# Patient Record
Sex: Male | Born: 1965 | Race: White | Hispanic: No | Marital: Married | Smoking: Former smoker
Health system: Southern US, Community
[De-identification: ages and names within clinical notes are randomized; demographics above are authoritative.]

## PROBLEM LIST (undated history)

## (undated) DIAGNOSIS — K5790 Diverticulosis of intestine, part unspecified, without perforation or abscess without bleeding: Secondary | ICD-10-CM

## (undated) DIAGNOSIS — T7840XA Allergy, unspecified, initial encounter: Secondary | ICD-10-CM

## (undated) DIAGNOSIS — F909 Attention-deficit hyperactivity disorder, unspecified type: Secondary | ICD-10-CM

## (undated) DIAGNOSIS — M1990 Unspecified osteoarthritis, unspecified site: Secondary | ICD-10-CM

## (undated) HISTORY — DX: Allergy, unspecified, initial encounter: T78.40XA

## (undated) HISTORY — DX: Attention-deficit hyperactivity disorder, unspecified type: F90.9

## (undated) HISTORY — DX: Diverticulosis of intestine, part unspecified, without perforation or abscess without bleeding: K57.90

## (undated) HISTORY — DX: Unspecified osteoarthritis, unspecified site: M19.90

## (undated) HISTORY — PX: WISDOM TOOTH EXTRACTION: SHX21

---

## 1999-01-17 ENCOUNTER — Encounter: Admission: RE | Admit: 1999-01-17 | Discharge: 1999-04-17 | Payer: Self-pay

## 2005-09-18 ENCOUNTER — Ambulatory Visit: Payer: Self-pay | Admitting: Internal Medicine

## 2005-10-31 ENCOUNTER — Ambulatory Visit: Payer: Self-pay | Admitting: Internal Medicine

## 2006-12-04 DIAGNOSIS — J309 Allergic rhinitis, unspecified: Secondary | ICD-10-CM | POA: Insufficient documentation

## 2006-12-06 ENCOUNTER — Ambulatory Visit: Payer: Self-pay | Admitting: Internal Medicine

## 2006-12-06 LAB — CONVERTED CEMR LAB
AST: 21 units/L (ref 0–37)
Alkaline Phosphatase: 69 units/L (ref 39–117)
BUN: 13 mg/dL (ref 6–23)
Basophils Relative: 0.4 % (ref 0.0–1.0)
CO2: 29 meq/L (ref 19–32)
Chloride: 106 meq/L (ref 96–112)
Creatinine, Ser: 0.7 mg/dL (ref 0.4–1.5)
HCT: 44.8 % (ref 39.0–52.0)
Hemoglobin: 15.2 g/dL (ref 13.0–17.0)
Monocytes Absolute: 0.6 10*3/uL (ref 0.2–0.7)
Monocytes Relative: 8.4 % (ref 3.0–11.0)
Neutrophils Relative %: 59.5 % (ref 43.0–77.0)
Potassium: 3.5 meq/L (ref 3.5–5.1)
RBC: 5.39 M/uL (ref 4.22–5.81)
RDW: 12.7 % (ref 11.5–14.6)
TSH: 0.69 microintl units/mL (ref 0.35–5.50)
Total Bilirubin: 1 mg/dL (ref 0.3–1.2)
Total CHOL/HDL Ratio: 5.5
Total Protein: 6.7 g/dL (ref 6.0–8.3)
Triglycerides: 190 mg/dL — ABNORMAL HIGH (ref 0–149)
VLDL: 38 mg/dL (ref 0–40)

## 2006-12-31 ENCOUNTER — Encounter: Payer: Self-pay | Admitting: Internal Medicine

## 2006-12-31 ENCOUNTER — Encounter: Admission: RE | Admit: 2006-12-31 | Discharge: 2007-02-20 | Payer: Self-pay | Admitting: Internal Medicine

## 2007-01-14 ENCOUNTER — Encounter: Payer: Self-pay | Admitting: Internal Medicine

## 2007-10-09 ENCOUNTER — Encounter: Payer: Self-pay | Admitting: Internal Medicine

## 2007-10-09 ENCOUNTER — Telehealth (INDEPENDENT_AMBULATORY_CARE_PROVIDER_SITE_OTHER): Payer: Self-pay | Admitting: *Deleted

## 2007-12-08 ENCOUNTER — Encounter (INDEPENDENT_AMBULATORY_CARE_PROVIDER_SITE_OTHER): Payer: Self-pay | Admitting: *Deleted

## 2008-01-23 ENCOUNTER — Ambulatory Visit: Payer: Self-pay | Admitting: Internal Medicine

## 2008-11-19 ENCOUNTER — Telehealth (INDEPENDENT_AMBULATORY_CARE_PROVIDER_SITE_OTHER): Payer: Self-pay | Admitting: *Deleted

## 2008-11-22 ENCOUNTER — Encounter: Payer: Self-pay | Admitting: Internal Medicine

## 2009-03-29 ENCOUNTER — Telehealth: Payer: Self-pay | Admitting: Internal Medicine

## 2009-03-30 ENCOUNTER — Telehealth (INDEPENDENT_AMBULATORY_CARE_PROVIDER_SITE_OTHER): Payer: Self-pay | Admitting: *Deleted

## 2010-01-11 ENCOUNTER — Ambulatory Visit: Payer: Self-pay | Admitting: Internal Medicine

## 2010-05-02 ENCOUNTER — Ambulatory Visit: Payer: Self-pay | Admitting: Internal Medicine

## 2010-05-02 DIAGNOSIS — M25529 Pain in unspecified elbow: Secondary | ICD-10-CM | POA: Insufficient documentation

## 2010-05-02 DIAGNOSIS — H9190 Unspecified hearing loss, unspecified ear: Secondary | ICD-10-CM | POA: Insufficient documentation

## 2010-05-02 LAB — CONVERTED CEMR LAB
Blood in Urine, dipstick: NEGATIVE
Ketones, urine, test strip: NEGATIVE
Nitrite: NEGATIVE
Protein, U semiquant: NEGATIVE
Specific Gravity, Urine: 1.02
Urobilinogen, UA: 0.2
WBC Urine, dipstick: NEGATIVE

## 2010-05-05 LAB — CONVERTED CEMR LAB
ALT: 31 units/L (ref 0–53)
AST: 26 units/L (ref 0–37)
Albumin: 4.5 g/dL (ref 3.5–5.2)
Alkaline Phosphatase: 65 units/L (ref 39–117)
Basophils Absolute: 0 10*3/uL (ref 0.0–0.1)
Calcium: 8.9 mg/dL (ref 8.4–10.5)
Eosinophils Relative: 1.6 % (ref 0.0–5.0)
GFR calc non Af Amer: 102.07 mL/min (ref >60)
HCT: 44.2 % (ref 39.0–52.0)
HDL: 47 mg/dL (ref >39.00)
Hemoglobin: 14.9 g/dL (ref 13.0–17.0)
Lymphocytes Relative: 21.5 % (ref 12.0–46.0)
Lymphs Abs: 1.5 10*3/uL (ref 0.7–4.0)
Monocytes Relative: 8.4 % (ref 3.0–12.0)
Neutro Abs: 4.8 10*3/uL (ref 1.4–7.7)
Potassium: 4.3 meq/L (ref 3.5–5.1)
RBC: 5.05 M/uL (ref 4.22–5.81)
RDW: 13.8 % (ref 11.5–14.6)
Sodium: 140 meq/L (ref 135–145)
TSH: 0.69 microintl units/mL (ref 0.35–5.50)
Total Bilirubin: 0.7 mg/dL (ref 0.3–1.2)
Total CHOL/HDL Ratio: 5
Triglycerides: 165 mg/dL — ABNORMAL HIGH (ref 0.0–149.0)
VLDL: 33 mg/dL (ref 0.0–40.0)
WBC: 7.1 10*3/uL (ref 4.5–10.5)

## 2010-09-05 NOTE — Assessment & Plan Note (Signed)
Summary: to be est/njr pt rsc/njr   Vital Signs:  Patient profile:   45 year old male Height:      71.5 inches Weight:      280 pounds BMI:     38.65 Temp:     98.7 degrees F oral Pulse rate:   78 / minute Pulse rhythm:   regular Resp:     12 per minute BP sitting:   120 / 80  Vitals Entered By: Lynann Beaver CMA (May 02, 2010 10:17 AM) CC: New ,,,,,,,,,,Cpx  concerned about numbness in different areas of body, decreased hearing, difficulty sleeping,  Is Patient Diabetic? No Pain Assessment Patient in pain? no        CC:  New , , , , , , , , , , Cpx  concerned about numbness in different areas of body, decreased hearing, difficulty sleeping, and .  History of Present Illness: CPX  concerned with weight  left elbow pain---duration 2-3 months, increases pain with bending  worried about right ankle being numb and twitching at times  hearing---concerned with hearing loss  Hands go numb--sxs are intermittent, tend to be most noticeable at rest  wife states he is irritable at time  Current Problems (verified): 1)  Allergic Rhinitis  (ICD-477.9) 2)  Overweight  (ICD-278.02)  Current Medications (verified): 1)  Allegra 180 Mg  Tabs (Fexofenadine Hcl) .... Take 1 Tablet By Mouth Once A Day 2)  Flonase 50 Mcg/act  Susp (Fluticasone Propionate) .Marland Kitchen.. 1-2 Sprays Each Nostril As Needed 3)  Epipen 2-Pak 0.3 Mg/0.61ml Devi (Epinephrine) .... As Directed 4)  Aspirin 81 Mg Tabs (Aspirin) .... Take 1 Tab Once Daily 5)  Multivitamins  Tabs (Multiple Vitamin) .... Take 1 Tab Once Daily 6)  Aleve 220 Mg Tabs (Naproxen Sodium) .... As Needed  Allergies (verified): No Known Drug Allergies  Family History: father - COPD, hx of rectal CA, died COPD age 23 mother- healthy, hx of Crohns (no longer treated)   Impression & Recommendations:  Problem # 1:  PREVENTIVE HEALTH CARE (ICD-V70.0)  health maint utd needs to lose weight discussed need for exercise and  diet  Orders: Venipuncture (51884) UA Dipstick w/o Micro (automated)  (81003) Specimen Handling (16606) TLB-Lipid Panel (80061-LIPID) TLB-BMP (Basic Metabolic Panel-BMET) (80048-METABOL) TLB-CBC Platelet - w/Differential (85025-CBCD) TLB-Hepatic/Liver Function Pnl (80076-HEPATIC) TLB-TSH (Thyroid Stimulating Hormone) (84443-TSH) Specimen Handling (30160)  Problem # 2:  OVERWEIGHT (ICD-278.02) this is clearly his most significant medical issue  Problem # 3:  ALLERGIC RHINITIS (ICD-477.9)  His updated medication list for this problem includes:    Allegra 180 Mg Tabs (Fexofenadine hcl) .Marland Kitchen... Take 1 tablet by mouth once a day    Flonase 50 Mcg/act Susp (Fluticasone propionate) .Marland Kitchen... 1-2 sprays each nostril as needed  Problem # 4:  ELBOW PAIN, LEFT (ICD-719.42) probably epicondylitis ice, elbow brace  Problem # 5:  HEARING LOSS (ICD-389.9)  Orders: Audiology (Audio)  Complete Medication List: 1)  Allegra 180 Mg Tabs (Fexofenadine hcl) .... Take 1 tablet by mouth once a day 2)  Flonase 50 Mcg/act Susp (Fluticasone propionate) .Marland Kitchen.. 1-2 sprays each nostril as needed 3)  Epipen 2-pak 0.3 Mg/0.29ml Devi (Epinephrine) .... As directed 4)  Aspirin 81 Mg Tabs (Aspirin) .... Take 1 tab once daily 5)  Multivitamins Tabs (Multiple vitamin) .... Take 1 tab once daily 6)  Aleve 220 Mg Tabs (Naproxen sodium) .... As needed  Other Orders: Admin 1st Vaccine (10932) Flu Vaccine 41yrs + (35573)  Flu Vaccine Consent Questions  Do you have a history of severe allergic reactions to this vaccine? no    Any prior history of allergic reactions to egg and/or gelatin? no    Do you have a sensitivity to the preservative Thimersol? no    Do you have a past history of Guillan-Barre Syndrome? no    Do you currently have an acute febrile illness? no    Have you ever had a severe reaction to latex? no    Vaccine information given and explained to patient? yes    Are you currently pregnant? no    Lot  Number:[id number]   Exp Date:02/03/2011   Site Given  Left Deltoid IM  .lbflu  Physical Exam General Appearance: well developed, well nourished, no acute distress Eyes: conjunctiva and lids normal, PERRL, EOMI, Ears, Nose, Mouth, Throat: TM clear, nares clear, oral exam WNL Neck: supple, no lymphadenopathy, no thyromegaly, no JVD Respiratory: clear to auscultation and percussion, respiratory effort normal Cardiovascular: regular rate and rhythm, S1-S2, no murmur, rub or gallop, no bruits, peripheral pulses normal and symmetric, no cyanosis, clubbing, edema or varicosities Chest: no scars, masses, tenderness; no asymmetry, skin changes, nipple discharge, no gynecomastia   Gastrointestinal: soft, non-tender; no hepatosplenomegaly, masses; active bowel sounds all quadrants, ; no masses, tenderness, hemorrhoids  Genitourinary: no hernia, or prostate enlargement Lymphatic: no cervical, axillary or inguinal adenopathy Musculoskeletal: gait normal, muscle tone and strength WNL, no joint swelling, effusions, discoloration, crepitus  Skin: clear, good turgor, color WNL, no rashes, lesions, or ulcerations Neurologic: normal mental status, normal reflexes, normal strength, sensation, and motion Psychiatric: alert; oriented to person, place and time Other Exam:   Laboratory Results   Urine Tests  Date/Time Recieved: May 02, 2010 12:25 PM\par  Date/Time Reported: May 02, 2010 12:24 PM   Routine Urinalysis   Color: yellow Appearance: Clear Glucose: negative   (Normal Range: Negative) Bilirubin: negative   (Normal Range: Negative) Ketone: negative   (Normal Range: Negative) Spec. Gravity: 1.020   (Normal Range: 1.003-1.035) Blood: negative   (Normal Range: Negative) pH: 6.0   (Normal Range: 5.0-8.0) Protein: negative   (Normal Range: Negative) Urobilinogen: 0.2   (Normal Range: 0-1) Nitrite: negative   (Normal Range: Negative) Leukocyte Esterace: negative   (Normal Range:  Negative)    Comments: Wynona Canes, CMA  May 02, 2010 12:25 PM

## 2010-09-05 NOTE — Assessment & Plan Note (Signed)
Summary: ringworm//lch   Vital Signs:  Patient profile:   45 year old male Height:      72.5 inches Weight:      273 pounds Temp:     98.4 degrees F oral Pulse rate:   82 / minute BP sitting:   140 / 80  (left arm)  Vitals Entered By: Jeremy Johann CMA (January 11, 2010 11:23 AM) CC: red rash on groin area and back of both thighs Comments REVIEWED MED LIST, PATIENT AGREED DOSE AND INSTRUCTION CORRECT    History of Present Illness: rash  in the lower extremities for a few days, no itching He used   spandex shorts around the time of the onset of the symptoms  Allergies (verified): No Known Drug Allergies  Past History:  Past Medical History: Reviewed history from 01/23/2008 and no changes required. Allergic rhinitis  Review of Systems       no fever but in the last few days has been feeling tired and sweaty Not used any new medications and   no new detergents No tick bites that he knows No cough  Physical Exam  General:  alert and well-developed.  no apparent distress Skin:  multiple erythematous lesions in the lower extremities, only in the proximal aspect back and front of the legs. The lesions are slightly elevated, not scaly, annular and arciform, size varies but no more than 1.5 cm.   Impression & Recommendations:  Problem # 1:  RASH-NONVESICULAR (ICD-782.1) rash is limited to the area that was in contact with spandex shorts that he used last week. Based on that and the fact that is not itching, first in my mind is an  allergic/topical reaction. Plan: Treat with steroids If no better consider empirical treatment for a fungal infection versus dermatology referral His updated medication list for this problem includes:    Triamcinolone Acetonide 0.1 % Lotn (Triamcinolone acetonide) .Marland Kitchen... Apply two times a day x 7 days  Complete Medication List: 1)  Allegra 180 Mg Tabs (Fexofenadine hcl) .Marland Kitchen.. 1 by mouth qd 2)  Flonase 50 Mcg/act Susp (Fluticasone propionate)  .Marland Kitchen.. 1-2 sprays each nostril 3)  Epipen 2-pak 0.3 Mg/0.57ml Devi (Epinephrine) .... As directed 4)  Aspirin 81 Mg Tabs (Aspirin) .... Take 1 tab once daily 5)  Multivitamins Tabs (Multiple vitamin) .... Take 1 tab once daily 6)  Aleve 220 Mg Tabs (Naproxen sodium) .... As needed 7)  Triamcinolone Acetonide 0.1 % Lotn (Triamcinolone acetonide) .... Apply two times a day x 7 days Prescriptions: TRIAMCINOLONE ACETONIDE 0.1 % LOTN (TRIAMCINOLONE ACETONIDE) apply two times a day x 7 days  #120cc x 1   Entered and Authorized by:   Nathan Quick E. Leopold Smyers MD   Signed by:   Nathan Rod. Emitt Maglione MD on 01/11/2010   Method used:   Print then Give to Patient   RxID:   [id number]

## 2011-02-02 ENCOUNTER — Ambulatory Visit (INDEPENDENT_AMBULATORY_CARE_PROVIDER_SITE_OTHER): Payer: BC Managed Care – PPO | Admitting: Internal Medicine

## 2011-02-02 ENCOUNTER — Encounter: Payer: Self-pay | Admitting: Internal Medicine

## 2011-02-02 VITALS — BP 104/76 | Temp 98.2°F | Wt 248.0 lb

## 2011-02-02 DIAGNOSIS — J309 Allergic rhinitis, unspecified: Secondary | ICD-10-CM

## 2011-02-02 MED ORDER — PREDNISONE (PAK) 10 MG PO TABS: ORAL_TABLET | ORAL | Status: DC

## 2011-02-02 NOTE — Progress Notes (Signed)
  Subjective:    Patient ID: Nathan Camacho, male    DOB: [birth year], 45 y.o.   MRN: [id number]  HPI  Patient is a 42 sinus congestion. He denies any fevers or chills. He has a history of allergic rhinitis. Previously was taking Allegra and Flonase.  Patient has been losing weight by exercising occasionally been following a better diet. Past Medical History  Diagnosis Date  . Allergy    No past surgical history on file.  reports that he has never smoked. He does not have any smokeless tobacco history on file. He reports that he drinks alcohol. He reports that he does not use illicit drugs. family history includes COPD in his father and Crohn's disease in his mother. No Known Allergies   Review of Systems No other complaints.    Objective:   Physical Exam Well-developed male in no acute distress. HEENT exam atraumatic, normocephalic, neck supple oropharynx is moist. There is no posterior oropharynx erythema or exudate. Nasal mucosa is red and swollen. Clear to auscultation.       Assessment & Plan:  I suspect the patient has an exacerbation of allergic rhinitis. Will treat with short course oral steroid. Side effects discussed.  Patient congratulated on weight loss. He should aim for a weight of 200 pounds to start with an eventual goal weight of175 pound

## 2011-10-22 ENCOUNTER — Other Ambulatory Visit: Payer: Self-pay | Admitting: Internal Medicine

## 2011-10-23 NOTE — Telephone Encounter (Signed)
Refill request for flonase . Pt has not been seen within a year. OK to refill?

## 2011-11-06 NOTE — Telephone Encounter (Signed)
Pharmacy called again. Is it ok to refill this med?

## 2011-11-09 ENCOUNTER — Other Ambulatory Visit: Payer: Self-pay | Admitting: Internal Medicine

## 2011-11-09 NOTE — Telephone Encounter (Signed)
Pt needs an appt. Hasn't been seen within a year.

## 2011-11-13 ENCOUNTER — Ambulatory Visit (INDEPENDENT_AMBULATORY_CARE_PROVIDER_SITE_OTHER): Payer: BC Managed Care – PPO | Admitting: Family

## 2011-11-13 ENCOUNTER — Encounter: Payer: Self-pay | Admitting: Family

## 2011-11-13 ENCOUNTER — Telehealth: Payer: Self-pay | Admitting: Internal Medicine

## 2011-11-13 DIAGNOSIS — Z0000 Encounter for general adult medical examination without abnormal findings: Secondary | ICD-10-CM

## 2011-11-13 DIAGNOSIS — J309 Allergic rhinitis, unspecified: Secondary | ICD-10-CM

## 2011-11-13 DIAGNOSIS — Z13220 Encounter for screening for lipoid disorders: Secondary | ICD-10-CM

## 2011-11-13 DIAGNOSIS — Z125 Encounter for screening for malignant neoplasm of prostate: Secondary | ICD-10-CM

## 2011-11-13 MED ORDER — FEXOFENADINE HCL 180 MG PO TABS: 180.0000 mg | ORAL_TABLET | Freq: Every day | ORAL | Status: DC

## 2011-11-13 MED ORDER — EPINEPHRINE 0.15 MG/0.3ML IJ DEVI: 0.1500 mg | INTRAMUSCULAR | Status: DC | PRN

## 2011-11-13 MED ORDER — FLUTICASONE PROPIONATE 50 MCG/ACT NA SUSP: 2.0000 | Freq: Every day | NASAL | Status: DC

## 2011-11-13 MED ORDER — EPINEPHRINE 0.3 MG/0.3ML IJ DEVI: 0.3000 mg | Freq: Once | INTRAMUSCULAR | Status: DC

## 2011-11-13 NOTE — Progress Notes (Signed)
  Subjective:    Patient ID: Nathan Camacho, male    DOB: 08/15/1965, 46 y.o.   MRN: [id number]  HPI 46 year old white male, nonsmoker patient of Dr. Cato Mulligan is in today for refill on his allergy medication. Currently takes Allegra and 80 mg, Flonase 2 sprays in each doctor once a day, and an EpiPen when necessary. He is currently stable on his medications. Denies any concerns.   Review of Systems  Constitutional: Negative.   HENT: Negative.   Eyes: Negative.   Respiratory: Negative.   Cardiovascular: Negative.   Neurological: Negative.   Hematological: Negative.    Past Medical History  Diagnosis Date  . Allergy     History   Social History  . Marital Status: Married    Spouse Name: N/A    Number of Children: N/A  . Years of Education: N/A   Occupational History  . Not on file.   Social History Main Topics  . Smoking status: Never Smoker   . Smokeless tobacco: Not on file  . Alcohol Use: Yes  . Drug Use: No  . Sexually Active: Not on file   Other Topics Concern  . Not on file   Social History Narrative  . No narrative on file    No past surgical history on file.  Family History  Problem Relation Age of Onset  . Crohn's disease Mother   . COPD Father     No Known Allergies  Current Outpatient Prescriptions on File Prior to Visit  Medication Sig Dispense Refill  . aspirin 81 MG tablet Take 81 mg by mouth daily.        . Multiple Vitamin (MULTIVITAMIN) tablet Take 1 tablet by mouth daily.        Marland Kitchen DISCONTD: fexofenadine (ALLEGRA) 180 MG tablet Take 180 mg by mouth daily.        Marland Kitchen DISCONTD: fluticasone (FLONASE) 50 MCG/ACT nasal spray Place 2 sprays into the nose daily.        . predniSONE (DELTASONE) 10 MG tablet pack 4 tabs, 2 tabs, 1 tab, 1/2 tab each for 2 days  24 tablet  0    BP 110/80  Temp(Src) 98.6 F (37 C) (Oral)  Wt 266 lb (120.657 kg)chart    Objective:   Physical Exam  Constitutional: He is oriented to person, place, and time. He  appears well-developed and well-nourished.  HENT:  Head: Normocephalic.  Right Ear: External ear normal.  Left Ear: External ear normal.  Nose: Nose normal.  Mouth/Throat: Oropharynx is clear and moist.  Eyes: Conjunctivae are normal. Pupils are equal, round, and reactive to light.  Neck: Neck supple.  Cardiovascular: Normal rate, regular rhythm and normal heart sounds.   Pulmonary/Chest: Effort normal and breath sounds normal.  Neurological: He is alert and oriented to person, place, and time.  Skin: Skin is warm and dry.  Psychiatric: He has a normal mood and affect.          Assessment & Plan:  Assessment: Allergic rhinitis  Plan: Prescriptions renewed. Patient will schedule complete physical exam the next couple months with Dr. Cato Mulligan. Labs in today for his lab appointment.

## 2011-11-13 NOTE — Patient Instructions (Signed)
Allergies, Generic Allergies may happen from anything your body is sensitive to. This may be food, medicines, pollens, chemicals, and nearly anything around you in everyday life that produces allergens. An allergen is anything that causes an allergy producing substance. Heredity is often a factor in causing these problems. This means you may have some of the same allergies as your parents. Food allergies happen in all age groups. Food allergies are some of the most severe and life threatening. Some common food allergies are cow's milk, seafood, eggs, nuts, wheat, and soybeans. SYMPTOMS   Swelling around the mouth.   An itchy red rash or hives.   Vomiting or diarrhea.   Difficulty breathing.  SEVERE ALLERGIC REACTIONS ARE LIFE-THREATENING. This reaction is called anaphylaxis. It can cause the mouth and throat to swell and cause difficulty with breathing and swallowing. In severe reactions only a trace amount of food (for example, peanut oil in a salad) may cause death within seconds. Seasonal allergies occur in all age groups. These are seasonal because they usually occur during the same season every year. They may be a reaction to molds, grass pollens, or tree pollens. Other causes of problems are house dust mite allergens, pet dander, and mold spores. The symptoms often consist of nasal congestion, a runny itchy nose associated with sneezing, and tearing itchy eyes. There is often an associated itching of the mouth and ears. The problems happen when you come in contact with pollens and other allergens. Allergens are the particles in the air that the body reacts to with an allergic reaction. This causes you to release allergic antibodies. Through a chain of events, these eventually cause you to release histamine into the blood stream. Although it is meant to be protective to the body, it is this release that causes your discomfort. This is why you were given anti-histamines to feel better. If you are  unable to pinpoint the offending allergen, it may be determined by skin or blood testing. Allergies cannot be cured but can be controlled with medicine. Hay fever is a collection of all or some of the seasonal allergy problems. It may often be treated with simple over-the-counter medicine such as diphenhydramine. Take medicine as directed. Do not drink alcohol or drive while taking this medicine. Check with your caregiver or package insert for child dosages. If these medicines are not effective, there are many new medicines your caregiver can prescribe. Stronger medicine such as nasal spray, eye drops, and corticosteroids may be used if the first things you try do not work well. Other treatments such as immunotherapy or desensitizing injections can be used if all else fails. Follow up with your caregiver if problems continue. These seasonal allergies are usually not life threatening. They are generally more of a nuisance that can often be handled using medicine. HOME CARE INSTRUCTIONS   If unsure what causes a reaction, keep a diary of foods eaten and symptoms that follow. Avoid foods that cause reactions.   If hives or rash are present:   Take medicine as directed.   You may use an over-the-counter antihistamine (diphenhydramine) for hives and itching as needed.   Apply cold compresses (cloths) to the skin or take baths in cool water. Avoid hot baths or showers. Heat will make a rash and itching worse.   If you are severely allergic:   Following a treatment for a severe reaction, hospitalization is often required for closer follow-up.   Wear a medic-alert bracelet or necklace stating the allergy.     You and your family must learn how to give adrenaline or use an anaphylaxis kit.   If you have had a severe reaction, always carry your anaphylaxis kit or EpiPen with you. Use this medicine as directed by your caregiver if a severe reaction is occurring. Failure to do so could have a fatal  outcome.  SEEK MEDICAL CARE IF:  You suspect a food allergy. Symptoms generally happen within 30 minutes of eating a food.   Your symptoms have not gone away within 2 days or are getting worse.   You develop new symptoms.   You want to retest yourself or your child with a food or drink you think causes an allergic reaction. Never do this if an anaphylactic reaction to that food or drink has happened before. Only do this under the care of a caregiver.  SEEK IMMEDIATE MEDICAL CARE IF:   You have difficulty breathing, are wheezing, or have a tight feeling in your chest or throat.   You have a swollen mouth, or you have hives, swelling, or itching all over your body.   You have had a severe reaction that has responded to your anaphylaxis kit or an EpiPen. These reactions may return when the medicine has worn off. These reactions should be considered life threatening.  MAKE SURE YOU:   Understand these instructions.   Will watch your condition.   Will get help right away if you are not doing well or get worse.  Document Released: 10/16/2002 Document Revised: 07/12/2011 Document Reviewed: 03/22/2008 [employer] Patient Information 2012 [employer]. 

## 2011-11-13 NOTE — Telephone Encounter (Signed)
Pharmacy needs clarification on EPINEPHrine (EPIPEN JR 2-PAK) 0.15 MG/0.3ML injection

## 2011-11-13 NOTE — Telephone Encounter (Signed)
Clarification given to pharmacy

## 2011-12-13 ENCOUNTER — Other Ambulatory Visit (INDEPENDENT_AMBULATORY_CARE_PROVIDER_SITE_OTHER): Payer: BC Managed Care – PPO

## 2011-12-13 DIAGNOSIS — Z0000 Encounter for general adult medical examination without abnormal findings: Secondary | ICD-10-CM

## 2011-12-13 DIAGNOSIS — Z13220 Encounter for screening for lipoid disorders: Secondary | ICD-10-CM

## 2011-12-13 DIAGNOSIS — Z125 Encounter for screening for malignant neoplasm of prostate: Secondary | ICD-10-CM

## 2011-12-13 LAB — POCT URINALYSIS DIPSTICK
Blood, UA: NEGATIVE
Glucose, UA: NEGATIVE
Nitrite, UA: NEGATIVE
Protein, UA: NEGATIVE
Urobilinogen, UA: 0.2

## 2011-12-13 LAB — BASIC METABOLIC PANEL
BUN: 10 mg/dL (ref 6–23)
Calcium: 8.9 mg/dL (ref 8.4–10.5)
Creatinine, Ser: 0.7 mg/dL (ref 0.4–1.5)
GFR: 124.04 mL/min (ref >60.00)

## 2011-12-13 LAB — CBC WITH DIFFERENTIAL/PLATELET
Basophils Absolute: 0 10*3/uL (ref 0.0–0.1)
Basophils Relative: 0.7 % (ref 0.0–3.0)
Eosinophils Absolute: 0.1 10*3/uL (ref 0.0–0.7)
HCT: 42.9 % (ref 39.0–52.0)
Hemoglobin: 14.5 g/dL (ref 13.0–17.0)
Lymphs Abs: 1.6 10*3/uL (ref 0.7–4.0)
MCHC: 33.8 g/dL (ref 30.0–36.0)
MCV: 88.2 fl (ref 78.0–100.0)
Neutro Abs: 3.7 10*3/uL (ref 1.4–7.7)
RDW: 13.4 % (ref 11.5–14.6)

## 2011-12-13 LAB — LIPID PANEL
Cholesterol: 162 mg/dL (ref 0–200)
Triglycerides: 55 mg/dL (ref 0.0–149.0)

## 2011-12-13 LAB — HEPATIC FUNCTION PANEL
Albumin: 3.9 g/dL (ref 3.5–5.2)
Total Protein: 6.3 g/dL (ref 6.0–8.3)

## 2011-12-13 LAB — PSA: PSA: 0.47 ng/mL (ref 0.10–4.00)

## 2011-12-13 LAB — TSH: TSH: 0.72 u[IU]/mL (ref 0.35–5.50)

## 2011-12-18 ENCOUNTER — Ambulatory Visit (INDEPENDENT_AMBULATORY_CARE_PROVIDER_SITE_OTHER): Payer: BC Managed Care – PPO | Admitting: Internal Medicine

## 2011-12-18 ENCOUNTER — Encounter: Payer: Self-pay | Admitting: Internal Medicine

## 2011-12-18 VITALS — BP 148/90 | HR 72 | Temp 98.3°F | Ht 71.75 in | Wt 267.0 lb

## 2011-12-18 DIAGNOSIS — N529 Male erectile dysfunction, unspecified: Secondary | ICD-10-CM

## 2011-12-18 DIAGNOSIS — Z0000 Encounter for general adult medical examination without abnormal findings: Secondary | ICD-10-CM

## 2011-12-18 NOTE — Progress Notes (Addendum)
Patient ID: Nathan Camacho, male   DOB: 20-Feb-1966, 46 y.o.   MRN: [id number]  cpx  He wonders about testosterone level (fatigue, decreased libido)  Past Medical History  Diagnosis Date  . Allergy     History   Social History  . Marital Status: Married    Spouse Name: N/A    Number of Children: N/A  . Years of Education: N/A   Occupational History  . Not on file.   Social History Main Topics  . Smoking status: Never Smoker   . Smokeless tobacco: Not on file  . Alcohol Use: Yes  . Drug Use: No  . Sexually Active: Not on file   Other Topics Concern  . Not on file   Social History Narrative  . No narrative on file    No past surgical history on file.  Family History  Problem Relation Age of Onset  . Crohn's disease Mother   . COPD Father     No Known Allergies  Current Outpatient Prescriptions on File Prior to Visit  Medication Sig Dispense Refill  . aspirin 81 MG tablet Take 81 mg by mouth daily.        Marland Kitchen EPINEPHrine (EPIPEN 2-PAK) 0.3 mg/0.3 mL DEVI Inject 0.3 mLs (0.3 mg total) into the muscle once.  1 Device  12  . fexofenadine (ALLEGRA) 180 MG tablet Take 1 tablet (180 mg total) by mouth daily.  30 tablet  5  . fluticasone (FLONASE) 50 MCG/ACT nasal spray Place 2 sprays into the nose daily.  16 g  5  . Multiple Vitamin (MULTIVITAMIN) tablet Take 1 tablet by mouth daily.           patient denies chest pain, shortness of breath, orthopnea. Denies lower extremity edema, abdominal pain, change in appetite, change in bowel movements. Patient denies rashes, musculoskeletal complaints. No other specific complaints in a complete review of systems.   BP 148/90  Pulse 72  Temp(Src) 98.3 F (36.8 C) (Oral)  Ht 5' 11.75" (1.822 m)  Wt 267 lb (121.11 kg)  BMI 36.46 kg/m2 Well-developed male in no acute distress. HEENT exam atraumatic, normocephalic, extraocular muscles are intact. Conjunctivae are pink without exudate. Neck is supple without lymphadenopathy,  thyromegaly, jugular venous distention. Chest is clear to auscultation without increased work of breathing. Cardiac exam S1-S2 are regular. The PMI is normal. No significant murmurs or gallops. Abdominal exam active bowel sounds, soft, nontender. No abdominal bruits. Extremities no clubbing cyanosis or edema. Peripheral pulses are normal without bruits. Neurologic exam alert and oriented without any motor or sensory deficits.  Well Visit: health maint UTD Will check testosterone level

## 2011-12-19 LAB — TESTOSTERONE: Testosterone: 340.99 ng/dL — ABNORMAL LOW (ref 350.00–890.00)

## 2011-12-21 NOTE — Progress Notes (Signed)
Quick Note:  Pt informed on VM ______ 

## 2013-02-23 ENCOUNTER — Encounter: Payer: Self-pay | Admitting: Family Medicine

## 2013-02-23 ENCOUNTER — Ambulatory Visit (INDEPENDENT_AMBULATORY_CARE_PROVIDER_SITE_OTHER): Payer: BC Managed Care – PPO | Admitting: Family Medicine

## 2013-02-23 VITALS — BP 122/78 | HR 61 | Temp 98.3°F | Wt 286.0 lb

## 2013-02-23 DIAGNOSIS — L02412 Cutaneous abscess of left axilla: Secondary | ICD-10-CM

## 2013-02-23 DIAGNOSIS — IMO0002 Reserved for concepts with insufficient information to code with codable children: Secondary | ICD-10-CM

## 2013-02-23 MED ORDER — DOXYCYCLINE HYCLATE 100 MG PO TABS: 100.0000 mg | ORAL_TABLET | Freq: Two times a day (BID) | ORAL | Status: DC

## 2013-02-23 NOTE — Patient Instructions (Addendum)
Warm compresses several times daily Follow up for any increased redness, fever, swelling, or fluctuance.

## 2013-02-23 NOTE — Progress Notes (Signed)
  Subjective:    Patient ID: Nathan Camacho, male    DOB: 1965-10-11, 47 y.o.   MRN: [id number]  HPI  2 weeks ago noted left axillary "pimple" and squeesed and out some pus. Initially seemed to improve but over the past few days had some recurrent swelling and pain After initial pus expressed couple weeks ago had some surrounding erythema and that did resolve. Denies any fever or chills. No history of MRSA. No other skin lesions noted.  Past Medical History  Diagnosis Date  . Allergy    No past surgical history on file.  reports that he quit smoking about 19 years ago. His smoking use included Cigarettes. He smoked 0.00 packs per day. He does not have any smokeless tobacco history on file. He reports that  drinks alcohol. He reports that he does not use illicit drugs. family history includes Atrial fibrillation in his mother; COPD in his father; and Crohn's disease in his mother. No Known Allergies    Review of Systems  Constitutional: Negative for fever and chills.  Hematological: Negative for adenopathy.       Objective:   Physical Exam  Constitutional: He appears well-developed and well-nourished.  Cardiovascular: Normal rate and regular rhythm.   Pulmonary/Chest: Effort normal and breath sounds normal. No respiratory distress. He has no wheezes. He has no rales.  Skin:  Left axilla small yellowish pustule. He has area of induration underneath this approximately 2 x 2 centimeters. Minimal erythema. No fluctuance whatsoever.          Assessment & Plan:  Small abscess left axilla. Nonfluctuant. We unroofed the pustule and cultured. Warm compresses several times daily. Doxycycline 100 mg twice a day for 10 days. Followup immediately if any increased redness, warmth, fluctuance, or increased pain.  We did not perform incision and drainage since this was very indurated

## 2013-02-26 LAB — WOUND CULTURE: Gram Stain: NONE SEEN

## 2013-03-27 ENCOUNTER — Telehealth: Payer: Self-pay | Admitting: Family Medicine

## 2013-03-27 ENCOUNTER — Ambulatory Visit (INDEPENDENT_AMBULATORY_CARE_PROVIDER_SITE_OTHER): Payer: BC Managed Care – PPO | Admitting: Family Medicine

## 2013-03-27 ENCOUNTER — Encounter: Payer: Self-pay | Admitting: Family Medicine

## 2013-03-27 VITALS — BP 128/64 | HR 86 | Temp 98.1°F | Wt 286.0 lb

## 2013-03-27 DIAGNOSIS — L0292 Furuncle, unspecified: Secondary | ICD-10-CM

## 2013-03-27 DIAGNOSIS — E669 Obesity, unspecified: Secondary | ICD-10-CM | POA: Insufficient documentation

## 2013-03-27 MED ORDER — DOXYCYCLINE HYCLATE 100 MG PO TABS: 100.0000 mg | ORAL_TABLET | Freq: Two times a day (BID) | ORAL | Status: DC

## 2013-03-27 NOTE — Progress Notes (Signed)
  Subjective:    Patient ID: Nathan Camacho, male    DOB: 1966/03/17, 47 y.o.   MRN: [id number]  HPI Recurrent furuncles left axillary region Recently had spontaneous draining abscess in this region which was cultured. Grew non-MRSA staph aureus. Patient improved promptly with doxycycline and infection resolved completely with that. Denies any fever or chills. No history of diabetes. Minimal associated pain.  Past Medical History  Diagnosis Date  . Allergy    No past surgical history on file.  reports that he quit smoking about 19 years ago. His smoking use included Cigarettes. He smoked 0.00 packs per day. He does not have any smokeless tobacco history on file. He reports that  drinks alcohol. He reports that he does not use illicit drugs. family history includes Atrial fibrillation in his mother; COPD in his father; Crohn's disease in his mother. No Known Allergies    Review of Systems  Constitutional: Negative for fever and chills.  Hematological: Negative for adenopathy.       Objective:   Physical Exam  Constitutional: He appears well-developed and well-nourished.  Cardiovascular: Normal rate.   Pulmonary/Chest: Effort normal and breath sounds normal. No respiratory distress. He has no wheezes. He has no rales.  Skin:  Left axilla reveals 3 separate indurated erythematous papules. No pustules. No fluctuance. Minimally tender.          Assessment & Plan:  Recurrent furuncles left axilla. Warm compresses several times daily. Doxycycline 100 mg twice daily with caution for sun protection.  He does not have any fluctuance to suggest indication for I&D at this time. We suggested anti-staph soap such as Lever 2000 or dial soap. Also suggested washcloth to agitate skin somewhat with bathing

## 2013-03-27 NOTE — Patient Instructions (Addendum)
Warm compresses to left axilla several times daily

## 2013-03-27 NOTE — Telephone Encounter (Signed)
Patient Information:  Caller Name: Leeandre  Phone: [phone]  Patient: Nathan Camacho, Nathan Camacho  Gender: Male  DOB: May 10, 1966  Age: 47 Years  PCP: Evelena Peat [facility])  Office Follow Up:  Does the office need to follow up with this patient?: No  Instructions For The Office: N/A  RN Note:  Re-occurrence of cyst/boil in left armpit.  It is warm and tender to touch.  Last treated with Doxycycline clearing quickly, and gone for about 3 weeks before its return.  Patient triaged with care advice.  Appointment scheduled with Dr. Caryl Never today 08/22 at 14:30.  Caller demonstrated his understanding.  Symptoms  Reason For Call & Symptoms: Had initially about 1 month ago; Treated with antibiotics and cleared up and was without it for about 3 weeks.  Returned about 03/27/13- same location-about size of large marble-with no head.  Underneath skin.  Reviewed Health History In EMR: Yes  Reviewed Medications In EMR: Yes  Reviewed Allergies In EMR: Yes  Reviewed Surgeries / Procedures: Yes  Date of Onset of Symptoms: 03/23/2013  Guideline(s) Used:  Rash or Redness - Localized  Skin Lesion - Moles or Growths  Disposition Per Guideline:   See Today in Office  Reason For Disposition Reached:   Looks like a boil, infected sore, or deep ulcer  Advice Given:  Call Back If:  You become worse.  Patient Will Follow Care Advice:  YES  Appointment Scheduled:  03/27/2013 14:30:00 Appointment Scheduled Provider:  Evelena Peat [facility])

## 2013-06-11 ENCOUNTER — Other Ambulatory Visit: Payer: Self-pay

## 2013-08-17 ENCOUNTER — Ambulatory Visit: Payer: Self-pay | Admitting: Podiatry

## 2013-08-19 ENCOUNTER — Ambulatory Visit: Payer: Self-pay | Admitting: Podiatry

## 2013-08-31 ENCOUNTER — Ambulatory Visit (INDEPENDENT_AMBULATORY_CARE_PROVIDER_SITE_OTHER): Payer: BC Managed Care – PPO

## 2013-08-31 ENCOUNTER — Ambulatory Visit (INDEPENDENT_AMBULATORY_CARE_PROVIDER_SITE_OTHER): Payer: BC Managed Care – PPO | Admitting: Podiatry

## 2013-08-31 ENCOUNTER — Encounter: Payer: Self-pay | Admitting: Podiatry

## 2013-08-31 VITALS — BP 140/78 | HR 68 | Resp 18

## 2013-08-31 DIAGNOSIS — M722 Plantar fascial fibromatosis: Secondary | ICD-10-CM

## 2013-08-31 DIAGNOSIS — M79609 Pain in unspecified limb: Secondary | ICD-10-CM

## 2013-08-31 MED ORDER — TRIAMCINOLONE ACETONIDE 10 MG/ML IJ SUSP
10.0000 mg | Freq: Once | INTRAMUSCULAR | Status: AC
  Administered 2013-08-31: 10 mg

## 2013-08-31 NOTE — Patient Instructions (Signed)
Plantar Fasciitis (Heel Spur Syndrome)  with Rehab  The plantar fascia is a fibrous, ligament-like, soft-tissue structure that spans the bottom of the foot. Plantar fasciitis is a condition that causes pain in the foot due to inflammation of the tissue.  SYMPTOMS   · Pain and tenderness on the underneath side of the foot.  · Pain that worsens with standing or walking.  CAUSES   Plantar fasciitis is caused by irritation and injury to the plantar fascia on the underneath side of the foot. Common mechanisms of injury include:  · Direct trauma to bottom of the foot.  · Damage to a small nerve that runs under the foot where the main fascia attaches to the heel bone.  · Stress placed on the plantar fascia due to bone spurs.  RISK INCREASES WITH:   · Activities that place stress on the plantar fascia (running, jumping, pivoting, or cutting).  · Poor strength and flexibility.  · Improperly fitted shoes.  · Tight calf muscles.  · Flat feet.  · Failure to warm-up properly before activity.  · Obesity.  PREVENTION  · Warm up and stretch properly before activity.  · Allow for adequate recovery between workouts.  · Maintain physical fitness:  · Strength, flexibility, and endurance.  · Cardiovascular fitness.  · Maintain a health body weight.  · Avoid stress on the plantar fascia.  · Wear properly fitted shoes, including arch supports for individuals who have flat feet.  PROGNOSIS   If treated properly, then the symptoms of plantar fasciitis usually resolve without surgery. However, occasionally surgery is necessary.  RELATED COMPLICATIONS   · Recurrent symptoms that may result in a chronic condition.  · Problems of the lower back that are caused by compensating for the injury, such as limping.  · Pain or weakness of the foot during push-off following surgery.  · Chronic inflammation, scarring, and partial or complete fascia tear, occurring more often from repeated injections.  TREATMENT   Treatment initially involves the use of  ice and medication to help reduce pain and inflammation. The use of strengthening and stretching exercises may help reduce pain with activity, especially stretches of the Achilles tendon. These exercises may be performed at home or with a therapist. Your caregiver may recommend that you use heel cups of arch supports to help reduce stress on the plantar fascia. Occasionally, corticosteroid injections are given to reduce inflammation. If symptoms persist for greater than 6 months despite non-surgical (conservative), then surgery may be recommended.   MEDICATION   · If pain medication is necessary, then nonsteroidal anti-inflammatory medications, such as aspirin and ibuprofen, or other minor pain relievers, such as acetaminophen, are often recommended.  · Do not take pain medication within 7 days before surgery.  · Prescription pain relievers may be given if deemed necessary by your caregiver. Use only as directed and only as much as you need.  · Corticosteroid injections may be given by your caregiver. These injections should be reserved for the most serious cases, because they may only be given a certain number of times.  HEAT AND COLD  · Cold treatment (icing) relieves pain and reduces inflammation. Cold treatment should be applied for 10 to 15 minutes every 2 to 3 hours for inflammation and pain and immediately after any activity that aggravates your symptoms. Use ice packs or massage the area with a piece of ice (ice massage).  · Heat treatment may be used prior to performing the stretching and strengthening activities prescribed   by your caregiver, physical therapist, or athletic trainer. Use a heat pack or soak the injury in warm water.  SEEK IMMEDIATE MEDICAL CARE IF:  · Treatment seems to offer no benefit, or the condition worsens.  · Any medications produce adverse side effects.  EXERCISES  RANGE OF MOTION (ROM) AND STRETCHING EXERCISES - Plantar Fasciitis (Heel Spur Syndrome)  These exercises may help you  when beginning to rehabilitate your injury. Your symptoms may resolve with or without further involvement from your physician, physical therapist or athletic trainer. While completing these exercises, remember:   · Restoring tissue flexibility helps normal motion to return to the joints. This allows healthier, less painful movement and activity.  · An effective stretch should be held for at least 30 seconds.  · A stretch should never be painful. You should only feel a gentle lengthening or release in the stretched tissue.  RANGE OF MOTION - Toe Extension, Flexion  · Sit with your right / left leg crossed over your opposite knee.  · Grasp your toes and gently pull them back toward the top of your foot. You should feel a stretch on the bottom of your toes and/or foot.  · Hold this stretch for __________ seconds.  · Now, gently pull your toes toward the bottom of your foot. You should feel a stretch on the top of your toes and or foot.  · Hold this stretch for __________ seconds.  Repeat __________ times. Complete this stretch __________ times per day.   RANGE OF MOTION - Ankle Dorsiflexion, Active Assisted  · Remove shoes and sit on a chair that is preferably not on a carpeted surface.  · Place right / left foot under knee. Extend your opposite leg for support.  · Keeping your heel down, slide your right / left foot back toward the chair until you feel a stretch at your ankle or calf. If you do not feel a stretch, slide your bottom forward to the edge of the chair, while still keeping your heel down.  · Hold this stretch for __________ seconds.  Repeat __________ times. Complete this stretch __________ times per day.   STRETCH  Gastroc, Standing  · Place hands on wall.  · Extend right / left leg, keeping the front knee somewhat bent.  · Slightly point your toes inward on your back foot.  · Keeping your right / left heel on the floor and your knee straight, shift your weight toward the wall, not allowing your back to  arch.  · You should feel a gentle stretch in the right / left calf. Hold this position for __________ seconds.  Repeat __________ times. Complete this stretch __________ times per day.  STRETCH  Soleus, Standing  · Place hands on wall.  · Extend right / left leg, keeping the other knee somewhat bent.  · Slightly point your toes inward on your back foot.  · Keep your right / left heel on the floor, bend your back knee, and slightly shift your weight over the back leg so that you feel a gentle stretch deep in your back calf.  · Hold this position for __________ seconds.  Repeat __________ times. Complete this stretch __________ times per day.  STRETCH  Gastrocsoleus, Standing   Note: This exercise can place a lot of stress on your foot and ankle. Please complete this exercise only if specifically instructed by your caregiver.   · Place the ball of your right / left foot on a step, keeping   your other foot firmly on the same step.  · Hold on to the wall or a rail for balance.  · Slowly lift your other foot, allowing your body weight to press your heel down over the edge of the step.  · You should feel a stretch in your right / left calf.  · Hold this position for __________ seconds.  · Repeat this exercise with a slight bend in your right / left knee.  Repeat __________ times. Complete this stretch __________ times per day.   STRENGTHENING EXERCISES - Plantar Fasciitis (Heel Spur Syndrome)   These exercises may help you when beginning to rehabilitate your injury. They may resolve your symptoms with or without further involvement from your physician, physical therapist or athletic trainer. While completing these exercises, remember:   · Muscles can gain both the endurance and the strength needed for everyday activities through controlled exercises.  · Complete these exercises as instructed by your physician, physical therapist or athletic trainer. Progress the resistance and repetitions only as guided.  STRENGTH - Towel  Curls  · Sit in a chair positioned on a non-carpeted surface.  · Place your foot on a towel, keeping your heel on the floor.  · Pull the towel toward your heel by only curling your toes. Keep your heel on the floor.  · If instructed by your physician, physical therapist or athletic trainer, add ____________________ at the end of the towel.  Repeat __________ times. Complete this exercise __________ times per day.  STRENGTH - Ankle Inversion  · Secure one end of a rubber exercise band/tubing to a fixed object (table, pole). Loop the other end around your foot just before your toes.  · Place your fists between your knees. This will focus your strengthening at your ankle.  · Slowly, pull your big toe up and in, making sure the band/tubing is positioned to resist the entire motion.  · Hold this position for __________ seconds.  · Have your muscles resist the band/tubing as it slowly pulls your foot back to the starting position.  Repeat __________ times. Complete this exercises __________ times per day.   Document Released: 07/23/2005 Document Revised: 10/15/2011 Document Reviewed: 11/04/2008  [employer]® Patient Information ©2014 [employer].

## 2013-08-31 NOTE — Progress Notes (Signed)
   Subjective:    Patient ID: Nathan Camacho, male    DOB: [birth year], 48 y.o.   MRN: [id number]  HPI my left heel hurts on the bottom and has been going on for 6 to 9 months and the right hurts some and hurts when I walk and tender and my big toe on my right feels bruised and started yesterday  Patient has existing orthotics from previous episode of foot pain. In addition, he relates a 40 pound weight gain in the past year and a half.  Review of Systems  Constitutional: Negative.   HENT: Negative.   Eyes: Negative.   Respiratory: Negative.   Cardiovascular: Negative.   Gastrointestinal: Negative.   Endocrine: Negative.   Genitourinary: Negative.   Musculoskeletal:       Joint pain and difficulty walking  Skin: Negative.   Allergic/Immunologic: Positive for environmental allergies.       Bee stings  Neurological: Negative.   Hematological: Negative.   Psychiatric/Behavioral: Negative.        Objective:   Physical Exam Orientated x3 white male  Vascular: The DP and PT pulses are 2/4 bilaterally  Neurological: Knee and ankle reflexes equal and reactive bilaterally  Dermatological: Texture and turgor within normal limits  Musculoskeletal: Patient has a medium to high arch foot contour with his heel in varus neutral position upon weightbearing. There is palpable tenderness in the medial plantar left heel without a palpable lesions. This seemed to be is primary pain source. There is mild palpable tenderness central inferior right heel without any palpable lesions.  X-rays taken today demonstrate posterior and inferior heel spurs left more pronounced than right.     Assessment & Plan:   Assessment: Plantar fasciitis bilaterally left more symptomatic than right  Plan: Skin is prepped with alcohol and Betadine and 10 mg of Kenalog mixed with 10 mg of plain Xylocaine and 2.5 mg of plain Marcaine injected into the inferior heel left for Kenalog injection #1. Shoeing and  stretching discussed. The patient will continue to wear his existing orthotics.  He will continue to take over-the-counter Aleve on a twice a day basis.  Reappoint as needed.

## 2013-12-03 ENCOUNTER — Ambulatory Visit (INDEPENDENT_AMBULATORY_CARE_PROVIDER_SITE_OTHER): Payer: BC Managed Care – PPO | Admitting: Physician Assistant

## 2013-12-03 ENCOUNTER — Encounter: Payer: Self-pay | Admitting: Physician Assistant

## 2013-12-03 VITALS — BP 128/78 | Temp 98.1°F | Wt 285.0 lb

## 2013-12-03 DIAGNOSIS — M545 Low back pain, unspecified: Secondary | ICD-10-CM

## 2013-12-03 DIAGNOSIS — R350 Frequency of micturition: Secondary | ICD-10-CM

## 2013-12-03 LAB — POCT URINALYSIS DIPSTICK
BILIRUBIN UA: NEGATIVE
Glucose, UA: NEGATIVE
KETONES UA: NEGATIVE
LEUKOCYTES UA: NEGATIVE
Nitrite, UA: NEGATIVE
Protein, UA: NEGATIVE
Spec Grav, UA: 1.025
Urobilinogen, UA: 0.2
pH, UA: 6.5

## 2013-12-03 MED ORDER — CYCLOBENZAPRINE HCL 10 MG PO TABS: 10.0000 mg | ORAL_TABLET | Freq: Every day | ORAL | Status: DC

## 2013-12-03 MED ORDER — FLUTICASONE PROPIONATE 50 MCG/ACT NA SUSP: 2.0000 | Freq: Every day | NASAL | Status: DC

## 2013-12-03 MED ORDER — FEXOFENADINE HCL 180 MG PO TABS: 180.0000 mg | ORAL_TABLET | Freq: Every day | ORAL | Status: DC

## 2013-12-03 MED ORDER — MELOXICAM 7.5 MG PO TABS: 7.5000 mg | ORAL_TABLET | Freq: Every day | ORAL | Status: DC

## 2013-12-03 NOTE — Patient Instructions (Signed)
Meloxicam daily for pain. Do not take Advil, Motrin, ibuprofen, Aleve, or naproxen while taking meloxicam.  Flexeril at bedtime for muscle relaxation. Will cause drowsiness, do not take when you will be operating a motor vehicle.  Followup if symptoms worsen or fail to improve despite treatment.  Back Pain, Adult Back pain is very common. The pain often gets better over time. The cause of back pain is usually not dangerous. Most people can learn to manage their back pain on their own.  HOME CARE   Stay active. Start with short walks on flat ground if you can. Try to walk farther each day.  Do not sit, drive, or stand in one place for more than 30 minutes. Do not stay in bed.  Do not avoid exercise or work. Activity can help your back heal faster.  Be careful when you bend or lift an object. Bend at your knees, keep the object close to you, and do not twist.  Sleep on a firm mattress. Lie on your side, and bend your knees. If you lie on your back, put a pillow under your knees.  Only take medicines as told by your doctor.  Put ice on the injured area.  Put ice in a plastic bag.  Place a towel between your skin and the bag.  Leave the ice on for 15-20 minutes, 03-04 times a day for the first 2 to 3 days. After that, you can switch between ice and heat packs.  Ask your doctor about back exercises or massage.  Avoid feeling anxious or stressed. Find good ways to deal with stress, such as exercise. GET HELP RIGHT AWAY IF:   Your pain does not go away with rest or medicine.  Your pain does not go away in 1 week.  You have new problems.  You do not feel well.  The pain spreads into your legs.  You cannot control when you poop (bowel movement) or pee (urinate).  Your arms or legs feel weak or lose feeling (numbness).  You feel sick to your stomach (nauseous) or throw up (vomit).  You have belly (abdominal) pain.  You feel like you may pass out (faint). MAKE SURE YOU:    Understand these instructions.  Will watch your condition.  Will get help right away if you are not doing well or get worse. Document Released: 01/09/2008 Document Revised: 10/15/2011 Document Reviewed: 12/11/2010 [facility] Patient Information 2014 [place].

## 2013-12-03 NOTE — Progress Notes (Signed)
Pre visit review using our clinic review tool, if applicable. No additional management support is needed unless otherwise documented below in the visit note. 

## 2013-12-03 NOTE — Progress Notes (Signed)
Subjective:    Patient ID: Nathan Camacho, male    DOB: 1966-04-01, 48 y.o.   MRN: [id number]  Back Pain This is a new problem. The current episode started today. The problem occurs constantly. The problem has been gradually worsening since onset. The pain is present in the lumbar spine. Quality: sharp. The pain does not radiate. The pain is at a severity of 5/10. The pain is moderate. The pain is the same all the time. The symptoms are aggravated by bending and position. Pertinent negatives include no abdominal pain, bladder incontinence, bowel incontinence, chest pain, dysuria, fever, headaches, leg pain, numbness, paresis, paresthesias, pelvic pain, perianal numbness, tingling, weakness or weight loss. Risk factors include obesity, lack of exercise and sedentary lifestyle. He has tried NSAIDs (Aleve) for the symptoms. The treatment provided no relief.  Urinary Frequency  This is a new (His symptoms have resolved by this week, however he is concerned about having had symptoms.) problem. The current episode started 1 to 4 weeks ago. The problem occurs intermittently. The problem has been resolved. The quality of the pain is described as aching. The pain is mild. There has been no fever. He is sexually active. There is no history of pyelonephritis. Associated symptoms include frequency and urgency. Pertinent negatives include no chills, discharge, flank pain, hematuria, hesitancy, nausea, possible pregnancy, sweats or vomiting. Treatments tried: hydration and cranberry juice. The treatment provided significant relief.      Review of Systems  Constitutional: Negative for fever, chills and weight loss.  Cardiovascular: Negative for chest pain.  Gastrointestinal: Negative for nausea, vomiting, abdominal pain and bowel incontinence.  Genitourinary: Positive for urgency and frequency. Negative for bladder incontinence, dysuria, hesitancy, hematuria, flank pain and pelvic pain.  Musculoskeletal:  Positive for back pain.  Neurological: Negative for tingling, weakness, numbness, headaches and paresthesias.  All other systems reviewed and are negative.  Past Medical History  Diagnosis Date  . Allergy    History reviewed. No pertinent past surgical history.  reports that he quit smoking about 19 years ago. His smoking use included Cigarettes. He smoked 0.00 packs per day. He has never used smokeless tobacco. He reports that he drinks alcohol. He reports that he does not use illicit drugs. family history includes Atrial fibrillation in his mother; COPD in his father; Crohn's disease in his mother. No Known Allergies  No change in PFS history since last office visit on 03/29/2013.     Objective:   Physical Exam  Nursing note and vitals reviewed. Constitutional: He is oriented to person, place, and time. He appears well-developed and well-nourished. No distress.  HENT:  Head: Normocephalic and atraumatic.  Eyes: Conjunctivae and EOM are normal. Pupils are equal, round, and reactive to light.  Neck: Normal range of motion. Neck supple.  Cardiovascular: Normal rate, regular rhythm, normal heart sounds and intact distal pulses.  Exam reveals no gallop and no friction rub.   No murmur heard. Pulmonary/Chest: Effort normal and breath sounds normal. No stridor. No respiratory distress. He has no wheezes. He has no rales. He exhibits no tenderness.  Abdominal: Soft. Bowel sounds are normal. There is no tenderness.  Genitourinary:  No CVA tenderness.  Musculoskeletal: Normal range of motion. He exhibits no edema and no tenderness (no tenderness to palpation of the paraspinal muscles in the lumbar region. No Tenderness to palpation of the bony processes of the spine.).  Neurological: He is alert and oriented to person, place, and time. He has normal reflexes. He displays  normal reflexes. No cranial nerve deficit. He exhibits normal muscle tone. Coordination normal.  Negative SLR.  Skin: Skin  is warm and dry. No rash noted. He is not diaphoretic. No erythema. No pallor.  Psychiatric: He has a normal mood and affect. His behavior is normal. Judgment and thought content normal.   Filed Vitals:   12/03/13 1602  BP: 128/78  Temp: 98.1 F (36.7 C)    Lab Results  Component Value Date   WBC 6.0 12/13/2011   HGB 14.5 12/13/2011   HCT 42.9 12/13/2011   PLT 185.0 12/13/2011   GLUCOSE 90 12/13/2011   CHOL 162 12/13/2011   TRIG 55.0 12/13/2011   HDL 43.80 12/13/2011   LDLDIRECT 151.9 05/02/2010   LDLCALC 107* 12/13/2011   ALT 25 12/13/2011   AST 23 12/13/2011   NA 141 12/13/2011   K 4.3 12/13/2011   CL 106 12/13/2011   CREATININE 0.7 12/13/2011   BUN 10 12/13/2011   CO2 25 12/13/2011   TSH 0.72 12/13/2011   PSA 0.47 12/13/2011   Urinalysis Component     Latest Ref Rng 12/03/2013  Color, UA      yellow  Clarity, UA      clear  Glucose      n  Bilirubin, UA      n  Ketones, UA      n  Specific Gravity, UA      1.025  RBC, UA      trace  pH, UA      6.5  Protein, UA      n  Urobilinogen, UA      0.2  Nitrite, UA      n  Leukocytes, UA      Negative       Assessment & Plan:  Nathan Camacho was seen today for back pain and urinary frequency.  Diagnoses and associated orders for this visit:  Urinary frequency - POCT urinalysis dipstick- trace RBC on dip. No sign of infection.  -     Will monitor for any reoccurrence of symptoms in the future and evaluate at that time if they recur.  Lumbago - meloxicam (MOBIC) 7.5 MG tablet; Take 1 tablet (7.5 mg total) by mouth daily. - cyclobenzaprine (FLEXERIL) 10 MG tablet; Take 1 tablet (10 mg total) by mouth at bedtime.  Other Orders - fluticasone (FLONASE) 50 MCG/ACT nasal spray; Place 2 sprays into both nostrils daily. - fexofenadine (ALLEGRA) 180 MG tablet; Take 1 tablet (180 mg total) by mouth daily.    Patient Instructions  Meloxicam daily for pain. Do not take Advil, Motrin, ibuprofen, Aleve, or naproxen while taking meloxicam.  Flexeril at  bedtime for muscle relaxation. Will cause drowsiness, do not take when you will be operating a motor vehicle.  Followup if symptoms worsen or fail to improve despite treatment.

## 2014-06-29 ENCOUNTER — Ambulatory Visit: Payer: BC Managed Care – PPO | Admitting: Family Medicine

## 2014-06-29 ENCOUNTER — Encounter: Payer: Self-pay | Admitting: Family Medicine

## 2014-06-29 ENCOUNTER — Ambulatory Visit (INDEPENDENT_AMBULATORY_CARE_PROVIDER_SITE_OTHER): Payer: BC Managed Care – PPO | Admitting: Family Medicine

## 2014-06-29 VITALS — BP 124/86 | HR 64 | Temp 97.8°F | Wt 288.2 lb

## 2014-06-29 DIAGNOSIS — R053 Chronic cough: Secondary | ICD-10-CM

## 2014-06-29 DIAGNOSIS — R05 Cough: Secondary | ICD-10-CM

## 2014-06-29 MED ORDER — BENZONATATE 100 MG PO CAPS: 100.0000 mg | ORAL_CAPSULE | Freq: Three times a day (TID) | ORAL | Status: DC | PRN

## 2014-06-29 MED ORDER — AZITHROMYCIN 250 MG PO TABS: ORAL_TABLET | ORAL | Status: DC

## 2014-06-29 NOTE — Patient Instructions (Signed)
For persistent cough - take zpack for possible bronchitis. May use tessalon perls for cough during the day. Push fluids and rest. If not better with this let us know. Good to see you today, call us with questions.

## 2014-06-29 NOTE — Progress Notes (Signed)
BP 124/86 mmHg  Pulse 64  Temp(Src) 97.8 F (36.6 C) (Oral)  Wt 288 lb 4 oz (130.749 kg)  SpO2 96%   CC: dry cough x 1 mo  Subjective:    Patient ID: Nathan Camacho, male    DOB: [birth year], 48 y.o.   MRN: [id number]  HPI: YAZEN ROSKO is a 48 y.o. male presenting on 06/29/2014 for Cough   Pleasant patient of Dr Leanne Chang presents with 1 mo h/o dry cough. This cough started 1 mo ago after trip to [place].  Started with cold "flu" sxs -  congestion, fatigue, myalgias, chills, stayed at home x1 day. + mild headaches since then, mild chest tightness and persistent dry cough. Coughing fits worse at night time.  No PNDrainage, rhinorrhea, ear or tooth pain, abd pain, wheezing.  OTC remedies (dayquil, nyquil, mucinex D) have only temporarily helped. Seasonal allergies - worse in spring. Currently denies allergic sxs. No h/o GERD. No h/o asthma. Not on ACEI. No sick contacts at home. No smokers at home.  Relevant past medical, surgical, family and social history reviewed and updated as indicated.  Allergies and medications reviewed and updated. Current Outpatient Prescriptions on File Prior to Visit  Medication Sig  . fexofenadine (ALLEGRA) 180 MG tablet Take 1 tablet (180 mg total) by mouth daily.  . fluticasone (FLONASE) 50 MCG/ACT nasal spray Place 2 sprays into both nostrils daily.  . Multiple Vitamin (MULTIVITAMIN) tablet Take 1 tablet by mouth daily.    Marland Kitchen aspirin 81 MG tablet Take 81 mg by mouth daily.    Marland Kitchen EPINEPHrine (EPIPEN 2-PAK) 0.3 mg/0.3 mL DEVI Inject 0.3 mLs (0.3 mg total) into the muscle once. (Patient not taking: Reported on 06/29/2014)   No current facility-administered medications on file prior to visit.    Review of Systems Per HPI unless specifically indicated above    Objective:    BP 124/86 mmHg  Pulse 64  Temp(Src) 97.8 F (36.6 C) (Oral)  Wt 288 lb 4 oz (130.749 kg)  SpO2 96%  Physical Exam  Constitutional: He appears well-developed and  well-nourished. No distress.  HENT:  Head: Normocephalic and atraumatic.  Right Ear: Hearing, tympanic membrane, external ear and ear canal normal.  Left Ear: Hearing, tympanic membrane, external ear and ear canal normal.  Nose: Mucosal edema present. No rhinorrhea. Right sinus exhibits no maxillary sinus tenderness and no frontal sinus tenderness. Left sinus exhibits no maxillary sinus tenderness and no frontal sinus tenderness.  Mouth/Throat: Uvula is midline, oropharynx is clear and moist and mucous membranes are normal. No oropharyngeal exudate, posterior oropharyngeal edema, posterior oropharyngeal erythema or tonsillar abscesses.  No noted oropharyngeal drainage  Eyes: Conjunctivae and EOM are normal. Pupils are equal, round, and reactive to light. No scleral icterus.  Neck: Normal range of motion. Neck supple.  Cardiovascular: Normal rate, regular rhythm, normal heart sounds and intact distal pulses.   No murmur heard. Pulmonary/Chest: Effort normal and breath sounds normal. No respiratory distress. He has no wheezes. He has no rales.  Dry cough present  Lymphadenopathy:    He has no cervical adenopathy.  Skin: Skin is warm and dry. No rash noted.  Nursing note and vitals reviewed.      Assessment & Plan:   Problem List Items Addressed This Visit    Persistent dry cough - Primary    Ongoing for last 4 weeks, initially with URI sxs. Will treat with zpack to cover for atypical bronchitis, treat with tessalon perls during day. Update  if not improving with treatment. Pt agrees with plan. Not consistent with GERD or allergic rhinitis cough - but would consider PPI trial.        Follow up plan: Return if symptoms worsen or fail to improve.

## 2014-06-29 NOTE — Progress Notes (Signed)
Pre visit review using our clinic review tool, if applicable. No additional management support is needed unless otherwise documented below in the visit note. 

## 2014-06-29 NOTE — Assessment & Plan Note (Addendum)
Ongoing for last 4 weeks, initially with URI sxs. Will treat with zpack to cover for atypical bronchitis, treat with tessalon perls during day. Update if not improving with treatment. Pt agrees with plan. Not consistent with GERD or allergic rhinitis cough - but would consider PPI trial.

## 2014-08-19 ENCOUNTER — Telehealth: Payer: Self-pay

## 2014-08-19 NOTE — Telephone Encounter (Signed)
Pt is an established [place] patient and I already saw him 06/2014. May schedule appt at his convenience to discuss possible ADHD, then would suggest CPE in April.

## 2014-08-19 NOTE — Telephone Encounter (Signed)
Pt left v/m; last semester at school pt had issues; pt has never been diagnosed with ADHD but pt does not want to wait until new pt appt with Dr Darnell Level on 12/02/14 to have testing done or get started on medication. [facility] advised pt to contact Dr Darnell Level. Please advise.pt request cb.

## 2014-08-19 NOTE — Telephone Encounter (Signed)
Carrie-please schedule as below (I got your voicemail and this is what Dr. Darnell Level wants). Thanks!

## 2014-08-20 NOTE — Telephone Encounter (Signed)
Patient scheduled appointment on 08/26/14 to discuss ADHD.  Patient made appointments in April for lab work and physical.

## 2014-08-24 ENCOUNTER — Ambulatory Visit (INDEPENDENT_AMBULATORY_CARE_PROVIDER_SITE_OTHER): Payer: BC Managed Care – PPO | Admitting: Family Medicine

## 2014-08-24 ENCOUNTER — Encounter: Payer: Self-pay | Admitting: Family Medicine

## 2014-08-24 VITALS — BP 126/78 | HR 64 | Temp 98.0°F | Wt 294.0 lb

## 2014-08-24 DIAGNOSIS — F909 Attention-deficit hyperactivity disorder, unspecified type: Secondary | ICD-10-CM | POA: Insufficient documentation

## 2014-08-24 DIAGNOSIS — R41840 Attention and concentration deficit: Secondary | ICD-10-CM

## 2014-08-24 HISTORY — DX: Attention-deficit hyperactivity disorder, unspecified type: F90.9

## 2014-08-24 MED ORDER — FLUTICASONE PROPIONATE 50 MCG/ACT NA SUSP: 2.0000 | Freq: Every day | NASAL | Status: DC

## 2014-08-24 MED ORDER — FEXOFENADINE HCL 180 MG PO TABS: 180.0000 mg | ORAL_TABLET | Freq: Every day | ORAL | Status: DC

## 2014-08-24 NOTE — Progress Notes (Signed)
Pre visit review using our clinic review tool, if applicable. No additional management support is needed unless otherwise documented below in the visit note. 

## 2014-08-24 NOTE — Assessment & Plan Note (Signed)
Endorses sxs consistent with adult ADD  Discussed different studying strategies Discussed different options for pharmacotherapy - stimulants vs nonstimulants as well as common side effects. Discussed option to refer to psychology for formal testing and dx. Will refer to Dr Lurline Hare for ADD testing. Pt to call me or schedule f/u appt after evaluation. Pt and wife agree with plan.

## 2014-08-24 NOTE — Progress Notes (Signed)
   BP 126/78 mmHg  Pulse 64  Temp(Src) 98 F (36.7 C) (Oral)  Wt 294 lb (133.358 kg)   CC: discuss ADHD  Subjective:    Patient ID: Nathan Camacho, male    DOB: 31-Dec-1965, 49 y.o.   MRN: [id number]  HPI: Nathan Camacho is a 49 y.o. male presenting on 08/24/2014 for Follow-up   Longstanding issues with attention at home, school, and at work.  Changed jobs over last 2.5 yrs, prior was a Pharmacist, hospital at [employer], now more administrative. Restarted classes studying for Master's in education. Getting 4.0, but struggles to get things done. Feels works better under pressure and does tend to postpone/procrastinate. Last semester noticed increased trouble focusing and concentrating. Pt noticing more trouble with these changes. Had trouble since grade school. Tested high but had trouble succeeding in HS. Did well in SATs. Did well getting associate's degree.   No known fam hx adhd.  Some anxiety history - gets more irritable and has trouble settling down to bedtime.   Trouble finishing tasks at home, at work, and trouble concentrating at school.  Denies hyperactivity. No significant fidgeting, some impulse control (answering other's questions.  Has been tried on wellbutrin in the past for anxiety for 1 year. Helped anxiety sxs but felt "like a zombie".   Relevant past medical, surgical, family and social history reviewed and updated as indicated. Interim medical history since our last visit reviewed. Allergies and medications reviewed and updated. Current Outpatient Prescriptions on File Prior to Visit  Medication Sig  . EPINEPHrine (EPIPEN 2-PAK) 0.3 mg/0.3 mL DEVI Inject 0.3 mLs (0.3 mg total) into the muscle once.  . Multiple Vitamin (MULTIVITAMIN) tablet Take 1 tablet by mouth daily.    Marland Kitchen aspirin 81 MG tablet Take 81 mg by mouth daily.     No current facility-administered medications on file prior to visit.    Review of Systems Per HPI unless specifically indicated above     Objective:      BP 126/78 mmHg  Pulse 64  Temp(Src) 98 F (36.7 C) (Oral)  Wt 294 lb (133.358 kg)  Wt Readings from Last 3 Encounters:  08/24/14 294 lb (133.358 kg)  06/29/14 288 lb 4 oz (130.749 kg)  12/03/13 285 lb (129.275 kg)    Physical Exam  Constitutional: He appears well-developed and well-nourished. No distress.  Psychiatric: His mood appears anxious (mild).  Mildly anxious, fidgety Good eye contact  Nursing note and vitals reviewed.     Assessment & Plan:   Problem List Items Addressed This Visit    Difficulty concentrating - Primary    Endorses sxs consistent with adult ADD  Discussed different studying strategies Discussed different options for pharmacotherapy - stimulants vs nonstimulants as well as common side effects. Discussed option to refer to psychology for formal testing and dx. Will refer to Dr Lurline Hare for ADD testing. Pt to call me or schedule f/u appt after evaluation. Pt and wife agree with plan.      Relevant Orders   Ambulatory referral to Psychology       Follow up plan: Return as needed.

## 2014-08-24 NOTE — Patient Instructions (Signed)
This does sound like adult ADD component. Pass by Nathan Camacho's office today for referral to Dr Lurline Hare for ADD testing. Then call me or return to discuss meds. In interim, work on studying strategies discussed today.

## 2014-08-26 ENCOUNTER — Ambulatory Visit: Payer: Self-pay | Admitting: Family Medicine

## 2014-09-08 ENCOUNTER — Ambulatory Visit (INDEPENDENT_AMBULATORY_CARE_PROVIDER_SITE_OTHER): Payer: BC Managed Care – PPO | Admitting: Psychology

## 2014-09-08 DIAGNOSIS — F900 Attention-deficit hyperactivity disorder, predominantly inattentive type: Secondary | ICD-10-CM

## 2014-09-22 ENCOUNTER — Ambulatory Visit (INDEPENDENT_AMBULATORY_CARE_PROVIDER_SITE_OTHER): Payer: BC Managed Care – PPO | Admitting: Psychology

## 2014-09-22 DIAGNOSIS — F900 Attention-deficit hyperactivity disorder, predominantly inattentive type: Secondary | ICD-10-CM

## 2014-10-15 ENCOUNTER — Ambulatory Visit (INDEPENDENT_AMBULATORY_CARE_PROVIDER_SITE_OTHER): Payer: BC Managed Care – PPO | Admitting: Internal Medicine

## 2014-10-15 ENCOUNTER — Encounter: Payer: Self-pay | Admitting: Internal Medicine

## 2014-10-15 VITALS — BP 120/78 | HR 78 | Temp 98.4°F | Wt 293.0 lb

## 2014-10-15 DIAGNOSIS — W57XXXA Bitten or stung by nonvenomous insect and other nonvenomous arthropods, initial encounter: Principal | ICD-10-CM

## 2014-10-15 DIAGNOSIS — L089 Local infection of the skin and subcutaneous tissue, unspecified: Secondary | ICD-10-CM

## 2014-10-15 DIAGNOSIS — S70362A Insect bite (nonvenomous), left thigh, initial encounter: Secondary | ICD-10-CM

## 2014-10-15 MED ORDER — DOXYCYCLINE HYCLATE 100 MG PO TABS: 100.0000 mg | ORAL_TABLET | Freq: Two times a day (BID) | ORAL | Status: DC

## 2014-10-15 NOTE — Progress Notes (Signed)
Subjective:    Patient ID: Nathan Camacho, male    DOB: Jan 26, 1966, 49 y.o.   MRN: [id number]  HPI  Pt presents to the clinic today with c/o tick bite. He noticed this last night. It is attached to the back of his right upper thigh. He is not sure how long the tick has been on there. He does report he was working outside on a car on Wednesday. He denies confusion, joint pain, nausea, vomiting, diarrhea or rash.  Review of Systems      Past Medical History  Diagnosis Date  . Allergy     Current Outpatient Prescriptions  Medication Sig Dispense Refill  . aspirin 81 MG tablet Take 81 mg by mouth daily.      Marland Kitchen EPINEPHrine (EPIPEN 2-PAK) 0.3 mg/0.3 mL DEVI Inject 0.3 mLs (0.3 mg total) into the muscle once. 1 Device 12  . fexofenadine (ALLEGRA) 180 MG tablet Take 1 tablet (180 mg total) by mouth daily. 30 tablet 11  . fluticasone (FLONASE) 50 MCG/ACT nasal spray Place 2 sprays into both nostrils daily. 16 g 11  . Multiple Vitamin (MULTIVITAMIN) tablet Take 1 tablet by mouth daily.       No current facility-administered medications for this visit.    No Known Allergies  Family History  Problem Relation Age of Onset  . Crohn's disease Mother   . Atrial fibrillation Mother   . COPD Father     History   Social History  . Marital Status: Married    Spouse Name: N/A  . Number of Children: N/A  . Years of Education: N/A   Occupational History  . Not on file.   Social History Main Topics  . Smoking status: Former Smoker    Types: Cigarettes    Quit date: 12/17/1993  . Smokeless tobacco: Never Used  . Alcohol Use: Yes  . Drug Use: No  . Sexual Activity: Not on file   Other Topics Concern  . 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  Respiratory: Denies difficulty breathing, shortness of breath, cough or sputum production.   Cardiovascular:  Denies chest pain, chest tightness, palpitations or swelling in the hands or feet.  Gastrointestinal: Denies abdominal pain, bloating, constipation, diarrhea or blood in the stool.  Musculoskeletal: Denies decrease in range of motion, difficulty with gait, muscle pain or joint pain and swelling.  Skin: Pt reports tick bite to right thigh. Denies redness, rashes, lesions or ulcercations.  Neurological: Denies dizziness, difficulty with memory, difficulty with speech or problems with balance and coordination.   No other specific complaints in a complete review of systems (except as listed in HPI above).  Objective:   Physical Exam  BP 120/78 mmHg  Pulse 78  Temp(Src) 98.4 F (36.9 C) (Oral)  Wt 293 lb (132.904 kg)  SpO2 98% Wt Readings from Last 3 Encounters:  10/15/14 293 lb (132.904 kg)  08/24/14 294 lb (133.358 kg)  06/29/14 288 lb 4 oz (130.749 kg)    General: Appears his stated age, obese in NAD. Skin: Warm, dry and intact. Tick embedded in right posterior thigh. Tick not engorged, 1.5 inch area of surrounding warm, redness and pain. Cardiovascular: Normal rate and rhythm. S1,S2 noted.  No murmur, rubs or gallops noted.  Pulmonary/Chest: Normal effort and positive vesicular breath sounds. No respiratory distress. No wheezes, rales or ronchi noted.  Neurological: Alert and oriented.   BMET    Component Value Date/Time   NA 141 12/13/2011 0846   K 4.3 12/13/2011 0846   CL 106 12/13/2011 0846   CO2 25 12/13/2011 0846   GLUCOSE 90 12/13/2011 0846   BUN 10 12/13/2011 0846   CREATININE 0.7 12/13/2011 0846   CALCIUM 8.9 12/13/2011 0846   GFRNONAA 102.07 05/02/2010 1058   GFRAA 161 12/06/2006 1040    Lipid Panel     Component Value Date/Time   CHOL 162 12/13/2011 0846   TRIG 55.0 12/13/2011 0846   HDL 43.80 12/13/2011 0846   CHOLHDL 4 12/13/2011 0846   VLDL 11.0 12/13/2011 0846   LDLCALC 107* 12/13/2011 0846    CBC    Component Value Date/Time   WBC 6.0 12/13/2011  0846   RBC 4.87 12/13/2011 0846   HGB 14.5 12/13/2011 0846   HCT 42.9 12/13/2011 0846   PLT 185.0 12/13/2011 0846   MCV 88.2 12/13/2011 0846   MCHC 33.8 12/13/2011 0846   RDW 13.4 12/13/2011 0846   LYMPHSABS 1.6 12/13/2011 0846   MONOABS 0.5 12/13/2011 0846   EOSABS 0.1 12/13/2011 0846   BASOSABS 0.0 12/13/2011 0846    Hgb A1C No results found for: HGBA1C       Assessment & Plan:   Tick bite of right thigh with infection:  Area cleaned with betadine Tick removed with tweezers, head was removed with 18 g needle Area washed with sterile water Covered with triple antibiotic ointment eRx for Doxycycline BID x 10 days to cover the cellulitis Return precautions given  RTC as needed

## 2014-10-15 NOTE — Patient Instructions (Signed)
Tick Bite Information Ticks are insects that attach themselves to the skin and draw blood for food. There are various types of ticks. Common types include wood ticks and deer ticks. Most ticks live in shrubs and grassy areas. Ticks can climb onto your body when you make contact with leaves or grass where the tick is waiting. The most common places on the body for ticks to attach themselves are the scalp, neck, armpits, waist, and groin. Most tick bites are harmless, but sometimes ticks carry germs that cause diseases. These germs can be spread to a person during the tick's feeding process. The chance of a disease spreading through a tick bite depends on:   The type of tick.  Time of year.   How long the tick is attached.   Geographic location.  HOW CAN YOU PREVENT TICK BITES? Take these steps to help prevent tick bites when you are outdoors:  Wear protective clothing. Long sleeves and long pants are best.   Wear white clothes so you can see ticks more easily.  Tuck your pant legs into your socks.   If walking on a trail, stay in the middle of the trail to avoid brushing against bushes.  Avoid walking through areas with long grass.  Put insect repellent on all exposed skin and along boot tops, pant legs, and sleeve cuffs.   Check clothing, hair, and skin repeatedly and before going inside.   Brush off any ticks that are not attached.  Take a shower or bath as soon as possible after being outdoors.  WHAT IS THE PROPER WAY TO REMOVE A TICK? Ticks should be removed as soon as possible to help prevent diseases caused by tick bites. 1. If latex gloves are available, put them on before trying to remove a tick.  2. Using fine-point tweezers, grasp the tick as close to the skin as possible. You may also use curved forceps or a tick removal tool. Grasp the tick as close to its head as possible. Avoid grasping the tick on its body. 3. Pull gently with steady upward pressure until  the tick lets go. Do not twist the tick or jerk it suddenly. This may break off the tick's head or mouth parts. 4. Do not squeeze or crush the tick's body. This could force disease-carrying fluids from the tick into your body.  5. After the tick is removed, wash the bite area and your hands with soap and water or other disinfectant such as alcohol. 6. Apply a small amount of antiseptic cream or ointment to the bite site.  7. Wash and disinfect any instruments that were used.  Do not try to remove a tick by applying a hot match, petroleum jelly, or fingernail polish to the tick. These methods do not work and may increase the chances of disease being spread from the tick bite.  WHEN SHOULD YOU SEEK MEDICAL CARE? Contact your health care provider if you are unable to remove a tick from your skin or if a part of the tick breaks off and is stuck in the skin.  After a tick bite, you need to be aware of signs and symptoms that could be related to diseases spread by ticks. Contact your health care provider if you develop any of the following in the days or weeks after the tick bite:  Unexplained fever.  Rash. A circular rash that appears days or weeks after the tick bite may indicate the possibility of Lyme disease. The rash may resemble   a target with a bull's-eye and may occur at a different part of your body than the tick bite.  Redness and swelling in the area of the tick bite.   Tender, swollen lymph glands.   Diarrhea.   Weight loss.   Cough.   Fatigue.   Muscle, joint, or bone pain.   Abdominal pain.   Headache.   Lethargy or a change in your level of consciousness.  Difficulty walking or moving your legs.   Numbness in the legs.   Paralysis.  Shortness of breath.   Confusion.   Repeated vomiting.  Document Released: 07/20/2000 Document Revised: 05/13/2013 Document Reviewed: 12/31/2012 [employer] Patient Information 2015 [employer]. This information is  not intended to replace advice given to you by your health care provider. Make sure you discuss any questions you have with your health care provider.  

## 2014-10-15 NOTE — Progress Notes (Signed)
Pre visit review using our clinic review tool, if applicable. No additional management support is needed unless otherwise documented below in the visit note. 

## 2014-10-16 ENCOUNTER — Encounter: Payer: Self-pay | Admitting: Family Medicine

## 2014-10-26 ENCOUNTER — Encounter: Payer: Self-pay | Admitting: Podiatry

## 2014-10-26 ENCOUNTER — Ambulatory Visit (INDEPENDENT_AMBULATORY_CARE_PROVIDER_SITE_OTHER): Payer: BC Managed Care – PPO

## 2014-10-26 ENCOUNTER — Ambulatory Visit (INDEPENDENT_AMBULATORY_CARE_PROVIDER_SITE_OTHER): Payer: BC Managed Care – PPO | Admitting: Podiatry

## 2014-10-26 VITALS — BP 125/77 | HR 77 | Resp 18

## 2014-10-26 DIAGNOSIS — Q667 Congenital pes cavus: Secondary | ICD-10-CM | POA: Diagnosis not present

## 2014-10-26 DIAGNOSIS — R52 Pain, unspecified: Secondary | ICD-10-CM | POA: Diagnosis not present

## 2014-10-26 DIAGNOSIS — M779 Enthesopathy, unspecified: Secondary | ICD-10-CM | POA: Diagnosis not present

## 2014-10-26 DIAGNOSIS — M216X9 Other acquired deformities of unspecified foot: Secondary | ICD-10-CM

## 2014-10-26 MED ORDER — TRIAMCINOLONE ACETONIDE 10 MG/ML IJ SUSP
10.0000 mg | Freq: Once | INTRAMUSCULAR | Status: AC
  Administered 2014-10-26: 10 mg

## 2014-10-26 NOTE — Progress Notes (Signed)
Subjective:     Patient ID: Nathan Camacho, male   DOB: [birth year], 49 y.o.   MRN: [id number]  HPI patient presents stating I have a lot of pain on the bottom of my right foot area states that there is a callus there and that it becomes inflamed and makes it hard for him to walk at times   Review of Systems     Objective:   Physical Exam Neurovascular status intact muscle strength adequate with inflammation around the right arch with a small nodule noted within the arch itself that is moderately painful when pressed. 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 Careful injection administered 3 mg Kenalog 5 mg Xylocaine which was tolerated well and we will watch this carefully and reevaluate him again in several weeks. It measured about 7 x 7 mm and will be rechecked again

## 2014-11-06 ENCOUNTER — Other Ambulatory Visit: Payer: Self-pay | Admitting: Family Medicine

## 2014-11-06 DIAGNOSIS — F909 Attention-deficit hyperactivity disorder, unspecified type: Secondary | ICD-10-CM

## 2014-11-06 DIAGNOSIS — E669 Obesity, unspecified: Secondary | ICD-10-CM

## 2014-11-08 ENCOUNTER — Other Ambulatory Visit (INDEPENDENT_AMBULATORY_CARE_PROVIDER_SITE_OTHER): Payer: BC Managed Care – PPO

## 2014-11-08 DIAGNOSIS — F909 Attention-deficit hyperactivity disorder, unspecified type: Secondary | ICD-10-CM

## 2014-11-08 DIAGNOSIS — E669 Obesity, unspecified: Secondary | ICD-10-CM | POA: Diagnosis not present

## 2014-11-08 DIAGNOSIS — F900 Attention-deficit hyperactivity disorder, predominantly inattentive type: Secondary | ICD-10-CM

## 2014-11-08 LAB — COMPREHENSIVE METABOLIC PANEL
ALBUMIN: 4 g/dL (ref 3.5–5.2)
ALT: 25 U/L (ref 0–53)
AST: 21 U/L (ref 0–37)
Alkaline Phosphatase: 68 U/L (ref 39–117)
BUN: 16 mg/dL (ref 6–23)
CALCIUM: 9.1 mg/dL (ref 8.4–10.5)
CHLORIDE: 105 meq/L (ref 96–112)
CO2: 25 meq/L (ref 19–32)
Creatinine, Ser: 0.95 mg/dL (ref 0.40–1.50)
GFR: 90.34 mL/min (ref >60.00)
Glucose, Bld: 99 mg/dL (ref 70–99)
Potassium: 4.1 mEq/L (ref 3.5–5.1)
Sodium: 136 mEq/L (ref 135–145)
Total Bilirubin: 0.6 mg/dL (ref 0.2–1.2)
Total Protein: 6.6 g/dL (ref 6.0–8.3)

## 2014-11-08 LAB — LIPID PANEL
CHOL/HDL RATIO: 5
Cholesterol: 185 mg/dL (ref 0–200)
HDL: 34.1 mg/dL — ABNORMAL LOW (ref >39.00)
LDL Cholesterol: 124 mg/dL — ABNORMAL HIGH (ref 0–99)
NonHDL: 150.9
Triglycerides: 133 mg/dL (ref 0.0–149.0)
VLDL: 26.6 mg/dL (ref 0.0–40.0)

## 2014-11-08 LAB — TSH: TSH: 1.44 u[IU]/mL (ref 0.35–4.50)

## 2014-11-11 ENCOUNTER — Encounter: Payer: Self-pay | Admitting: Family Medicine

## 2014-11-11 ENCOUNTER — Ambulatory Visit (INDEPENDENT_AMBULATORY_CARE_PROVIDER_SITE_OTHER): Payer: BC Managed Care – PPO | Admitting: Family Medicine

## 2014-11-11 VITALS — BP 128/86 | HR 68 | Temp 98.3°F | Ht 71.75 in | Wt 295.2 lb

## 2014-11-11 DIAGNOSIS — Z0000 Encounter for general adult medical examination without abnormal findings: Secondary | ICD-10-CM

## 2014-11-11 DIAGNOSIS — F909 Attention-deficit hyperactivity disorder, unspecified type: Secondary | ICD-10-CM

## 2014-11-11 MED ORDER — FLUTICASONE PROPIONATE 50 MCG/ACT NA SUSP: 2.0000 | Freq: Every day | NASAL | Status: DC

## 2014-11-11 MED ORDER — AMPHETAMINE-DEXTROAMPHETAMINE 5 MG PO TABS: 5.0000 mg | ORAL_TABLET | Freq: Two times a day (BID) | ORAL | Status: DC

## 2014-11-11 MED ORDER — EPINEPHRINE 0.3 MG/0.3ML IJ SOAJ: 0.3000 mg | Freq: Once | INTRAMUSCULAR | Status: DC

## 2014-11-11 MED ORDER — FEXOFENADINE HCL 180 MG PO TABS: 180.0000 mg | ORAL_TABLET | Freq: Every day | ORAL | Status: DC

## 2014-11-11 NOTE — Progress Notes (Signed)
Pre visit review using our clinic review tool, if applicable. No additional management support is needed unless otherwise documented below in the visit note. 

## 2014-11-11 NOTE — Assessment & Plan Note (Signed)
Mild ADHD predominant inattention. Discussed different pharmacotherapy options namely stimulants vs nonstimulants. Pt had bad response to wellbutrin in the past. Requests stimulant - to take PRN during week, and then discussed stimulant holidays on weekend. Start adderall 5mg  BID PRN #[id number] provided today. Pt will call me in 1-2 wks with update on effect, slowly titrate accordlingly. Discussed common side effects to monitor for including but not limited to headache, chest pain, insomnia, anorexia. Will need to discuss controlled substance agreement form at next visit.

## 2014-11-11 NOTE — Assessment & Plan Note (Signed)
Discussed healthy diet and lifestyle changes to affect sustainable weight loss. Body mass index is 40.34 kg/(m^2).

## 2014-11-11 NOTE — Progress Notes (Signed)
BP 128/86 mmHg  Pulse 68  Temp(Src) 98.3 F (36.8 C) (Oral)  Ht 5' 11.75" (1.822 m)  Wt 295 lb 4 oz (133.925 kg)  BMI 40.34 kg/m2   CC: CPE  Subjective:    Patient ID: THANE AGE, male    DOB: October 11, 1965, 49 y.o.   MRN: [id number]  HPI: BEVIN MAYALL is a 49 y.o. male presenting on 11/11/2014 for Annual Exam   ADD dx - s/p eval with Dr Lurline Hare. Mild ADD. Would like to discuss pharmacotherapy. Previously trialed wellbutrin and did not tolerate this well.   Preventative: No recent CPE Td 2008 Seat belt use discussed Sunscreen use discussed. No changing moles on skin.  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 Interim medical history since our last visit reviewed. Allergies and medications reviewed and updated. Current Outpatient Prescriptions on File Prior to Visit  Medication Sig  . aspirin 81 MG tablet Take 81 mg by mouth daily.    . Multiple Vitamin (MULTIVITAMIN) tablet Take 1 tablet by mouth daily.     No current facility-administered medications on file prior to visit.    Review of Systems  Constitutional: Negative for fever, chills, activity change, appetite change, fatigue and unexpected weight change.  HENT: Negative for hearing loss.   Eyes: Negative for visual disturbance.  Respiratory: Negative for cough, chest tightness, shortness of breath and wheezing.   Cardiovascular: Negative for chest pain, palpitations and leg swelling.  Gastrointestinal: Negative for nausea, vomiting, abdominal pain, diarrhea, constipation, blood in stool and abdominal distention.  Genitourinary: Negative for hematuria and difficulty urinating.  Musculoskeletal: Negative for myalgias, arthralgias and neck pain.  Skin: Negative for rash.  Neurological:  Negative for dizziness, seizures, syncope and headaches.  Hematological: Negative for adenopathy. Does not bruise/bleed easily.  Psychiatric/Behavioral: Positive for decreased concentration. Negative for dysphoric mood. The patient is not nervous/anxious.    Per HPI unless specifically indicated above     Objective:    BP 128/86 mmHg  Pulse 68  Temp(Src) 98.3 F (36.8 C) (Oral)  Ht 5' 11.75" (1.822 m)  Wt 295 lb 4 oz (133.925 kg)  BMI 40.34 kg/m2  Wt Readings from Last 3 Encounters:  11/11/14 295 lb 4 oz (133.925 kg)  10/15/14 293 lb (132.904 kg)  08/24/14 294 lb (133.358 kg)    Physical Exam  Constitutional: He is oriented to person, place, and time. He appears well-developed and well-nourished. No distress.  HENT:  Head: Normocephalic and atraumatic.  Right Ear: Hearing, tympanic membrane, external ear and ear canal normal.  Left Ear: Hearing, tympanic membrane, external ear and ear canal normal.  Nose: Nose normal.  Mouth/Throat: Uvula is midline, oropharynx is clear and moist and mucous membranes are normal. No oropharyngeal exudate, posterior oropharyngeal edema or posterior oropharyngeal erythema.  Eyes: Conjunctivae and EOM are normal. Pupils are equal, round, and reactive to light. No scleral icterus.  Neck: Normal range of motion. Neck supple. No thyromegaly present.  Cardiovascular: Normal rate, regular rhythm, normal heart sounds and intact distal pulses.   No murmur heard. Pulses:      Radial pulses are 2+ on the right side, and 2+ on the left side.  Pulmonary/Chest: Effort normal and breath sounds normal. No respiratory distress. He has no wheezes. He has no rales.  Abdominal: Soft.  Bowel sounds are normal. He exhibits no distension and no mass. There is no tenderness. There is no rebound and no guarding.  Musculoskeletal: Normal range of motion. He exhibits no edema.  Lymphadenopathy:    He has no cervical adenopathy.  Neurological: He is alert and oriented to  person, place, and time.  CN grossly intact, station and gait intact  Skin: Skin is warm and dry. No rash noted.  Psychiatric: He has a normal mood and affect. His behavior is normal. Judgment and thought content normal.  Nursing note and vitals reviewed.  Results for orders placed or performed in visit on 11/08/14  TSH  Result Value Ref Range   TSH 1.44 0.35 - 4.50 uIU/mL  Lipid panel  Result Value Ref Range   Cholesterol 185 0 - 200 mg/dL   Triglycerides 133.0 0.0 - 149.0 mg/dL   HDL 34.10 (L) >39.00 mg/dL   VLDL 26.6 0.0 - 40.0 mg/dL   LDL Cholesterol 124 (H) 0 - 99 mg/dL   Total CHOL/HDL Ratio 5    NonHDL 150.90   Comprehensive metabolic panel  Result Value Ref Range   Sodium 136 135 - 145 mEq/L   Potassium 4.1 3.5 - 5.1 mEq/L   Chloride 105 96 - 112 mEq/L   CO2 25 19 - 32 mEq/L   Glucose, Bld 99 70 - 99 mg/dL   BUN 16 6 - 23 mg/dL   Creatinine, Ser 0.95 0.40 - 1.50 mg/dL   Total Bilirubin 0.6 0.2 - 1.2 mg/dL   Alkaline Phosphatase 68 39 - 117 U/L   AST 21 0 - 37 U/L   ALT 25 0 - 53 U/L   Total Protein 6.6 6.0 - 8.3 g/dL   Albumin 4.0 3.5 - 5.2 g/dL   Calcium 9.1 8.4 - 10.5 mg/dL   GFR 90.34 >60.00 mL/min      Assessment & Plan:   Problem List Items Addressed This Visit    Morbid obesity    Discussed healthy diet and lifestyle changes to affect sustainable weight loss. Body mass index is 40.34 kg/(m^2).       Relevant Medications   ADDERALL 5 MG PO TABS   Health maintenance examination - Primary    Preventative protocols reviewed and updated unless pt declined. Discussed healthy diet and lifestyle.       Adult ADHD    Mild ADHD predominant inattention. Discussed different pharmacotherapy options namely stimulants vs nonstimulants. Pt had bad response to wellbutrin in the past. Requests stimulant - to take PRN during week, and then discussed stimulant holidays on weekend. Start adderall 5mg  BID PRN #60 provided today. Pt will call me in 1-2 wks with update  on effect, slowly titrate accordlingly. Discussed common side effects to monitor for including but not limited to headache, chest pain, insomnia, anorexia. Will need to discuss controlled substance agreement form at next visit.          Follow up plan: Return in about 6 weeks (around 12/23/2014), or as needed, for follow up visit.

## 2014-11-11 NOTE — Assessment & Plan Note (Signed)
Preventative protocols reviewed and updated unless pt declined. Discussed healthy diet and lifestyle.  

## 2014-11-11 NOTE — Patient Instructions (Addendum)
Trial adderall 5mg  twice daily (around 7am and noon)  Update me with effect after 1-2 weeks.  Incorporate regular exercise into routine. Work on [employer] changes.  Return in 4-6 wks for follow up

## 2014-11-12 ENCOUNTER — Ambulatory Visit: Payer: BC Managed Care – PPO | Admitting: Family Medicine

## 2014-11-12 ENCOUNTER — Telehealth: Payer: Self-pay | Admitting: *Deleted

## 2014-11-12 NOTE — Telephone Encounter (Signed)
PA required for Adderall. Submitted and approved through Cover My Meds. Pharmacy notified.

## 2014-11-15 NOTE — Telephone Encounter (Signed)
PA approved. Pharmacy notified 

## 2014-12-02 ENCOUNTER — Telehealth: Payer: Self-pay | Admitting: *Deleted

## 2014-12-02 MED ORDER — AMPHETAMINE-DEXTROAMPHETAMINE 10 MG PO TABS
10.0000 mg | ORAL_TABLET | Freq: Two times a day (BID) | ORAL | Status: DC
Start: 2014-12-02 — End: 2015-01-04

## 2014-12-02 NOTE — Telephone Encounter (Signed)
I'm glad he's tolerating adderall 5mg  bid. Given still having trouble focusing we could increase adderall to 10mg  bid, but would monitor for elevation in blood pressure, palpitations or tachycardia.  Would suggest start with 2 tablets in am (10mg ) and 1 in afternoon (5mg ) until he runs out and I will print script out for 10mg  to take BID (in Kim's box)

## 2014-12-02 NOTE — Telephone Encounter (Signed)
Patient calling to follow up.  He starting taking Adderall 5mg  bid about 3 weeks ago.  He reports that he is doing well.  He denies any trouble sleeping if doesn't take the afternoon dose too late.  He still has some difficulty with focus but it has improved.  Patient requests a call back to discuss potential need to increase dose.

## 2014-12-02 NOTE — Telephone Encounter (Signed)
Patient notified and verbalized understanding. Rx placed up front for pick up.  

## 2015-01-04 ENCOUNTER — Other Ambulatory Visit: Payer: Self-pay

## 2015-01-04 NOTE — Telephone Encounter (Signed)
Pt left v/m requesting rx for Adderall. Call when ready for pick up. Pt said the 10 mg bid is working well and pt is satisfied with how he is feeling and does not want to change dosage at this time. Last annual exam 11/11/2014 and last rx printed 12/02/14.

## 2015-01-05 MED ORDER — AMPHETAMINE-DEXTROAMPHETAMINE 10 MG PO TABS: 10.0000 mg | ORAL_TABLET | Freq: Two times a day (BID) | ORAL | Status: DC

## 2015-01-05 NOTE — Telephone Encounter (Signed)
Message left notifying patient and Rx placed up front for pick up. 

## 2015-01-05 NOTE — Telephone Encounter (Signed)
Printed and in Kim's box 

## 2015-02-17 ENCOUNTER — Other Ambulatory Visit: Payer: Self-pay

## 2015-02-17 MED ORDER — AMPHETAMINE-DEXTROAMPHETAMINE 10 MG PO TABS: 10.0000 mg | ORAL_TABLET | Freq: Two times a day (BID) | ORAL | Status: DC

## 2015-02-17 NOTE — Telephone Encounter (Signed)
Printed and in Kim's box 

## 2015-02-17 NOTE — Telephone Encounter (Signed)
Patient notified and Rx placed up front for patient.

## 2015-02-17 NOTE — Telephone Encounter (Signed)
Pt left v/m requesting rx for Adderall. Call when ready for pick up. rx last printed # 60 on 01/05/2015 and last annual exam on 11/11/2014. Pt doing well on adderall 10 mg; pt does not always take the second pill each day.

## 2015-04-05 ENCOUNTER — Other Ambulatory Visit: Payer: Self-pay

## 2015-04-05 NOTE — Telephone Encounter (Signed)
Pt left v/m requesting rx for Adderall. Call when ready for pick up. rx last printed # 60 on 02/17/15 and pt last seen 11/11/2014.

## 2015-04-06 MED ORDER — AMPHETAMINE-DEXTROAMPHETAMINE 10 MG PO TABS: 10.0000 mg | ORAL_TABLET | Freq: Two times a day (BID) | ORAL | Status: DC

## 2015-04-06 NOTE — Telephone Encounter (Signed)
Printed and in Kim's box 

## 2015-04-06 NOTE — Telephone Encounter (Signed)
Patient notified and Rx placed up front for pick up. 

## 2015-05-11 ENCOUNTER — Other Ambulatory Visit: Payer: Self-pay

## 2015-05-11 MED ORDER — AMPHETAMINE-DEXTROAMPHETAMINE 10 MG PO TABS: 10.0000 mg | ORAL_TABLET | Freq: Two times a day (BID) | ORAL | Status: DC

## 2015-05-11 NOTE — Telephone Encounter (Signed)
Patient notified and Rx placed up front for pick up. 

## 2015-05-11 NOTE — Telephone Encounter (Signed)
Printed and in Kim's box 

## 2015-05-11 NOTE — Telephone Encounter (Signed)
Pt left v/m requesting rx for Adderall. Call when ready for pick up. Pt said everything is going fine right now. rx last printed # 60 on 04/06/15. Last seen 11/11/14.

## 2015-06-20 ENCOUNTER — Ambulatory Visit (INDEPENDENT_AMBULATORY_CARE_PROVIDER_SITE_OTHER): Payer: BC Managed Care – PPO | Admitting: Family Medicine

## 2015-06-20 ENCOUNTER — Encounter: Payer: Self-pay | Admitting: Family Medicine

## 2015-06-20 ENCOUNTER — Ambulatory Visit (INDEPENDENT_AMBULATORY_CARE_PROVIDER_SITE_OTHER)
Admission: RE | Admit: 2015-06-20 | Discharge: 2015-06-20 | Disposition: A | Discharge status: Home / Self Care | Payer: BC Managed Care – PPO | Source: Ambulatory Visit | Attending: Family Medicine | Admitting: Family Medicine

## 2015-06-20 VITALS — BP 114/82 | HR 68 | Temp 97.9°F | Wt 283.2 lb

## 2015-06-20 DIAGNOSIS — Z23 Encounter for immunization: Secondary | ICD-10-CM

## 2015-06-20 DIAGNOSIS — M25511 Pain in right shoulder: Secondary | ICD-10-CM

## 2015-06-20 MED ORDER — NAPROXEN 500 MG PO TABS: ORAL_TABLET | ORAL | Status: DC

## 2015-06-20 MED ORDER — AMPHETAMINE-DEXTROAMPHETAMINE 10 MG PO TABS: 10.0000 mg | ORAL_TABLET | Freq: Two times a day (BID) | ORAL | Status: DC

## 2015-06-20 NOTE — Patient Instructions (Signed)
Xray to check arthritis. I think you have rotator cuff tendonitis. Treat with naprosyn twice daily with food for 5 days then as needed. Do exercises provided today. If no better with this, let us know for referral to physical therapy.

## 2015-06-20 NOTE — Progress Notes (Signed)
   BP 114/82 mmHg  Pulse 68  Temp(Src) 97.9 F (36.6 C) (Oral)  Wt 283 lb 4 oz (128.481 kg)   CC: R shoulder pain  Subjective:    Patient ID: Nathan Camacho, male    DOB: [birth year], 49 y.o.   MRN: [id number]  HPI: Nathan Camacho is a 49 y.o. male presenting on 06/20/2015 for Shoulder Pain   R>L shoulder aching ongoing for several months. Now waking him up at night time. Denies inciting trauma/injury. Not improving. Worse lifting arm above neck.   Treating with aleve which helps.  Denies numbness/weakness down arm. No neck pain.   Relevant past medical, surgical, family and social history reviewed and updated as indicated. Interim medical history since our last visit reviewed. Allergies and medications reviewed and updated. Current Outpatient Prescriptions on File Prior to Visit  Medication Sig  . aspirin 81 MG tablet Take 81 mg by mouth daily.    Marland Kitchen EPINEPHrine 0.3 mg/0.3 mL IJ SOAJ injection Inject 0.3 mLs (0.3 mg total) into the muscle once.  . fexofenadine (ALLEGRA) 180 MG tablet Take 1 tablet (180 mg total) by mouth daily.  . fluticasone (FLONASE) 50 MCG/ACT nasal spray Place 2 sprays into both nostrils daily.  . Multiple Vitamin (MULTIVITAMIN) tablet Take 1 tablet by mouth daily.     No current facility-administered medications on file prior to visit.    Review of Systems Per HPI unless specifically indicated in ROS section     Objective:    BP 114/82 mmHg  Pulse 68  Temp(Src) 97.9 F (36.6 C) (Oral)  Wt 283 lb 4 oz (128.481 kg)  Wt Readings from Last 3 Encounters:  06/20/15 283 lb 4 oz (128.481 kg)  11/11/14 295 lb 4 oz (133.925 kg)  10/15/14 293 lb (132.904 kg)    Physical Exam  Constitutional: He appears well-developed and well-nourished. No distress.  Musculoskeletal: He exhibits no edema.  L shoulder WNL R Shoulder exam: No deformity of shoulders on inspection. No pain with palpation of shoulder landmarks. FROM in abduction and forward flexion. No  pain or weakness with testing SITS in ext/int rotation. Mild pain with empty can sign. Neg Yerguson, Speed test. No impingement. No pain with crossover test. No pain with rotation of humeral head in [place] joint.   Neurological: He is alert. He has normal strength. No sensory deficit.  5/5 strength BUE  Nursing note and vitals reviewed.     Assessment & Plan:   Problem List Items Addressed This Visit    Right shoulder pain - Primary    Anticipate R RTC tendonitis vs less likely osteoarthritis - check Xray to eval arthritic burden. Treat with exercises from SM pt advisor, naprosyn course. Update if not improving with treatment to consider PT referral. Pt agrees with plan.      Relevant Orders   DG Shoulder Right    Other Visit Diagnoses    Need for influenza vaccination        Relevant Orders    Flu Vaccine QUAD 36+ mos PF IM (Fluarix & Fluzone Quad PF) (Completed)        Follow up plan: Return if symptoms worsen or fail to improve.

## 2015-06-20 NOTE — Assessment & Plan Note (Signed)
Anticipate R RTC tendonitis vs less likely osteoarthritis - check Xray to eval arthritic burden. Treat with exercises from SM pt advisor, naprosyn course. Update if not improving with treatment to consider PT referral. Pt agrees with plan.

## 2015-06-20 NOTE — Progress Notes (Signed)
Pre visit review using our clinic review tool, if applicable. No additional management support is needed unless otherwise documented below in the visit note. 

## 2015-06-27 ENCOUNTER — Ambulatory Visit: Payer: Self-pay | Admitting: Family Medicine

## 2015-07-13 ENCOUNTER — Ambulatory Visit: Payer: BC Managed Care – PPO | Admitting: Podiatry

## 2015-08-02 ENCOUNTER — Other Ambulatory Visit: Payer: Self-pay

## 2015-08-02 ENCOUNTER — Encounter: Payer: Self-pay | Admitting: Podiatry

## 2015-08-02 ENCOUNTER — Ambulatory Visit (INDEPENDENT_AMBULATORY_CARE_PROVIDER_SITE_OTHER): Payer: BC Managed Care – PPO | Admitting: Podiatry

## 2015-08-02 VITALS — BP 144/86 | HR 72 | Resp 12

## 2015-08-02 DIAGNOSIS — M722 Plantar fascial fibromatosis: Secondary | ICD-10-CM | POA: Diagnosis not present

## 2015-08-02 NOTE — Patient Instructions (Signed)
Today your examination demonstrated good circulation and feeling in your feet You describe a long history of ongoing heel and arch pain consistent with low-grade plantar fasciitis best controlled with a rigid orthotics Today we will obtain a digital scan and provide you with replacement orthotics and notify you when the orthotics arrive

## 2015-08-02 NOTE — Telephone Encounter (Signed)
Pt left v/m requesting rx for Adderall. Call when ready for pick up. rx last printed # 60 on 06/20/15. Last annual exam on 11/11/14.

## 2015-08-02 NOTE — Progress Notes (Signed)
   Subjective:    Patient ID: Nathan Camacho, male    DOB: [birth year], 49 y.o.   MRN: [id number]  HPI    Patient presents today stating that when he stands and walks his feet are very uncomfortable after several hours plus standing. Since he has wearing custom foot orthotics for the past 5+ years the symptoms are reduced. His concern is that the orthotics are old and is requesting a replacement orthotics. The orthotics are rigid with an extrinsic heel post intrinsic forefoot post. Since occupation requires mixed standing and occasionally prolonged periods of walking    Review of Systems  Musculoskeletal: Positive for joint swelling.  All other systems reviewed and are negative.      Objective:   Physical Exam  Pleasant orientated 3  Vascular: No peripheral edema noted bilaterally DP and PT pulses 2/4 bilaterally Capillary reflex immediate bilaterally  Neurological: Sensation to 10 g monofilament wire intact 5/5 bilaterally Vibratory sensation reactive bilaterally Ankle reflex equal and reactive bilaterally  Dermatological: Texture and turgor within normal limits  Musculoskeletal: Mild palpable tenderness mid fascial bands bilaterally without a palpable lesions No restriction ankle, subtalar, midtarsal joints bilaterally        Assessment & Plan:   Assessment: Satisfactory neurovascular status Chronic low-grade bilateral fasciitis  Plan: Digital scan obtained today for custom foot orthotics Polly pro firm Extrinsic rear foot post Intrinsic forefoot post  Call patient on receipt of custom orthotics

## 2015-08-03 MED ORDER — AMPHETAMINE-DEXTROAMPHETAMINE 10 MG PO TABS: 10.0000 mg | ORAL_TABLET | Freq: Two times a day (BID) | ORAL | Status: DC

## 2015-08-03 NOTE — Telephone Encounter (Signed)
Printed and in Kim's box 

## 2015-08-04 NOTE — Telephone Encounter (Signed)
Patient notified and Rx placed up front for pick up. 

## 2015-08-24 ENCOUNTER — Ambulatory Visit: Payer: BC Managed Care – PPO | Admitting: Podiatry

## 2015-09-07 ENCOUNTER — Ambulatory Visit (INDEPENDENT_AMBULATORY_CARE_PROVIDER_SITE_OTHER): Payer: BC Managed Care – PPO | Admitting: Podiatry

## 2015-09-07 ENCOUNTER — Encounter: Payer: Self-pay | Admitting: Podiatry

## 2015-09-07 VITALS — BP 127/79 | HR 78 | Resp 12

## 2015-09-07 DIAGNOSIS — M722 Plantar fascial fibromatosis: Secondary | ICD-10-CM

## 2015-09-07 NOTE — Progress Notes (Signed)
   Subjective:    Patient ID: Nathan Camacho, male    DOB: [birth year], 50 y.o.   MRN: [id number]  HPI She presents today for dispensing of custom foot orthotics   Review of Systems  All other systems reviewed and are negative.      Objective:   Physical Exam  Custom foot orthotics that in intrinsic rear foot and intrinsic forefoot post with shallow heels      Assessment & Plan:   Assessment: Unsatisfactory orthotic  Plan: Contact the hallux had lab and reordered the following orthotic 2-D poly-pro firm 24mm heel post Sulcus length  Notify patient upon receipt of replacement orthotic

## 2015-09-15 ENCOUNTER — Other Ambulatory Visit: Payer: Self-pay

## 2015-09-15 NOTE — Telephone Encounter (Signed)
Pt left v/m requesting rx for Adderall. Call when ready for pick up. rx last printed # 60 on 08/03/15. Last annual exam on 11/11/14.

## 2015-09-16 MED ORDER — AMPHETAMINE-DEXTROAMPHETAMINE 10 MG PO TABS: 10.0000 mg | ORAL_TABLET | Freq: Two times a day (BID) | ORAL | Status: DC

## 2015-09-16 NOTE — Telephone Encounter (Signed)
Printed and in Kim's box 

## 2015-09-16 NOTE — Telephone Encounter (Signed)
Spoke with patient and advised rx ready for pick-up and it will be at the front desk.  

## 2015-09-19 ENCOUNTER — Ambulatory Visit (INDEPENDENT_AMBULATORY_CARE_PROVIDER_SITE_OTHER): Payer: BC Managed Care – PPO | Admitting: Family Medicine

## 2015-09-19 ENCOUNTER — Encounter: Payer: Self-pay | Admitting: Family Medicine

## 2015-09-19 VITALS — BP 110/70 | HR 80 | Temp 97.8°F | Wt 281.5 lb

## 2015-09-19 DIAGNOSIS — S39012A Strain of muscle, fascia and tendon of lower back, initial encounter: Secondary | ICD-10-CM | POA: Diagnosis not present

## 2015-09-19 MED ORDER — CYCLOBENZAPRINE HCL 10 MG PO TABS: 5.0000 mg | ORAL_TABLET | Freq: Two times a day (BID) | ORAL | Status: DC | PRN

## 2015-09-19 MED ORDER — NAPROXEN 500 MG PO TABS: ORAL_TABLET | ORAL | Status: DC

## 2015-09-19 NOTE — Progress Notes (Signed)
   BP 110/70 mmHg  Pulse 80  Temp(Src) 97.8 F (36.6 C) (Oral)  Wt 281 lb 8 oz (127.688 kg)   CC: back pain  Subjective:    Patient ID: Nathan Camacho, male    DOB: 05-25-1966, 50 y.o.   MRN: [id number]  HPI: Nathan Camacho is a 50 y.o. male presenting on 09/19/2015 for Back Pain   Yesterday morning while working in yard bending and twisting cutting grass - felt sharp strain of lower back. Difficulty getting out of bed this morning. Mostly lower back on right with some radiation to R thigh. Movement worsens pain.  Denies fever/chills, shooting pain down legs, bowel/bladder incontinence, numbness or weakness.   No h/o back surgery or injury in the past.   Has been using aleve PRN as well as icy hot.  Relevant past medical, surgical, family and social history reviewed and updated as indicated. Interim medical history since our last visit reviewed. Allergies and medications reviewed and updated. Current Outpatient Prescriptions on File Prior to Visit  Medication Sig  . amphetamine-dextroamphetamine (ADDERALL) 10 MG tablet Take 1 tablet (10 mg total) by mouth 2 (two) times daily.  Marland Kitchen aspirin 81 MG tablet Take 81 mg by mouth daily.    Marland Kitchen EPINEPHrine 0.3 mg/0.3 mL IJ SOAJ injection Inject 0.3 mLs (0.3 mg total) into the muscle once.  . fexofenadine (ALLEGRA) 180 MG tablet Take 1 tablet (180 mg total) by mouth daily.  . fluticasone (FLONASE) 50 MCG/ACT nasal spray Place 2 sprays into both nostrils daily.  . Multiple Vitamin (MULTIVITAMIN) tablet Take 1 tablet by mouth daily.     No current facility-administered medications on file prior to visit.    Review of Systems Per HPI unless specifically indicated in ROS section     Objective:    BP 110/70 mmHg  Pulse 80  Temp(Src) 97.8 F (36.6 C) (Oral)  Wt 281 lb 8 oz (127.688 kg)  Wt Readings from Last 3 Encounters:  09/19/15 281 lb 8 oz (127.688 kg)  06/20/15 283 lb 4 oz (128.481 kg)  11/11/14 295 lb 4 oz (133.925 kg)      Physical Exam  Constitutional: He appears well-developed and well-nourished. No distress.  Musculoskeletal: He exhibits no edema.  No pain midline spine No paraspinous mm tenderness Neg SLR bilaterally. No pain with int/ext rotation at hip. FABER tender on testing L side. No pain at SIJ, GTB or sciatic notch bilaterally.   Neurological: He has normal strength. No sensory deficit.  5/5 strength BLE  Skin: Skin is warm and dry. No rash noted.  Psychiatric: He has a normal mood and affect.  Nursing note and vitals reviewed.      Assessment & Plan:   Problem List Items Addressed This Visit    Lumbar strain - Primary    Benign exam. Treat with naprosyn, flexeril, ice/heating pad.  Update if not improving. Pt and wife agree with plan.          Follow up plan: Return if symptoms worsen or fail to improve.

## 2015-09-19 NOTE — Progress Notes (Signed)
Pre visit review using our clinic review tool, if applicable. No additional management support is needed unless otherwise documented below in the visit note. 

## 2015-09-19 NOTE — Patient Instructions (Signed)
You have lumbar strain. Treat with rest, ice/heat, anti inflammatory and muscle relaxant. Let us know if not improving as expected

## 2015-09-19 NOTE — Assessment & Plan Note (Signed)
Benign exam. Treat with naprosyn, flexeril, ice/heating pad.  Update if not improving. Pt and wife agree with plan.

## 2015-10-25 ENCOUNTER — Other Ambulatory Visit: Payer: Self-pay | Admitting: Family Medicine

## 2015-10-25 NOTE — Telephone Encounter (Signed)
Ok to refill in Dr. Synthia Innocent absence? Last filled 09/16/15 #60 0RF

## 2015-10-26 MED ORDER — AMPHETAMINE-DEXTROAMPHETAMINE 10 MG PO TABS: 10.0000 mg | ORAL_TABLET | Freq: Two times a day (BID) | ORAL | Status: DC

## 2015-10-26 NOTE — Telephone Encounter (Signed)
Patient notified by telephone that script is up front ready for pickup. 

## 2015-10-26 NOTE — Telephone Encounter (Signed)
Printed.  Thanks.  

## 2015-12-20 ENCOUNTER — Other Ambulatory Visit: Payer: Self-pay | Admitting: Family Medicine

## 2015-12-20 MED ORDER — AMPHETAMINE-DEXTROAMPHETAMINE 10 MG PO TABS: 10.0000 mg | ORAL_TABLET | Freq: Two times a day (BID) | ORAL | Status: DC

## 2015-12-20 NOTE — Telephone Encounter (Signed)
Printed and in Kim's box 

## 2015-12-20 NOTE — Telephone Encounter (Signed)
Last office visit 09/19/2015.  Last refilled 10/26/2015 for #60 with no refills.  Ok to refill?

## 2015-12-20 NOTE — Addendum Note (Signed)
Addended by: Ria Bush on: 12/20/2015 05:35 PM   Modules accepted: Orders

## 2015-12-21 NOTE — Telephone Encounter (Signed)
Message left advising patient and Rx placed up front for pick up. 

## 2015-12-28 ENCOUNTER — Telehealth: Payer: Self-pay | Admitting: *Deleted

## 2015-12-28 NOTE — Telephone Encounter (Signed)
Adderall requiring PA. Completed and approved through Cover My Meds. Pharmacy notified.

## 2016-01-31 ENCOUNTER — Other Ambulatory Visit: Payer: Self-pay | Admitting: Family Medicine

## 2016-01-31 MED ORDER — AMPHETAMINE-DEXTROAMPHETAMINE 10 MG PO TABS: 10.0000 mg | ORAL_TABLET | Freq: Two times a day (BID) | ORAL | Status: DC

## 2016-01-31 NOTE — Telephone Encounter (Signed)
Left voicemail letting pt know Rx ready for pick up 

## 2016-01-31 NOTE — Telephone Encounter (Signed)
Last office visit 09/19/2015.  Last refilled 12/20/2015 for #60 with no refills.  Ok to refill?

## 2016-01-31 NOTE — Telephone Encounter (Signed)
Printed and in Kim's box 

## 2016-02-19 ENCOUNTER — Encounter (HOSPITAL_COMMUNITY): Payer: Self-pay | Admitting: Emergency Medicine

## 2016-02-19 ENCOUNTER — Ambulatory Visit (HOSPITAL_COMMUNITY)
Admission: EM | Admit: 2016-02-19 | Discharge: 2016-02-19 | Disposition: A | Discharge status: Home / Self Care | Payer: BC Managed Care – PPO | Attending: Family Medicine | Admitting: Family Medicine

## 2016-02-19 DIAGNOSIS — L237 Allergic contact dermatitis due to plants, except food: Secondary | ICD-10-CM

## 2016-02-19 MED ORDER — PREDNISONE 20 MG PO TABS: 20.0000 mg | ORAL_TABLET | Freq: Every day | ORAL | Status: DC

## 2016-02-19 MED ORDER — BETAMETHASONE DIPROPIONATE 0.05 % EX OINT: TOPICAL_OINTMENT | Freq: Two times a day (BID) | CUTANEOUS | Status: DC

## 2016-02-19 NOTE — Discharge Instructions (Signed)

## 2016-02-19 NOTE — ED Notes (Signed)
The patient presented to the [facility] with a complaint of a rash on his arms x 5 days that started after cutting brush.

## 2016-02-19 NOTE — ED Provider Notes (Signed)
CSN: [id number]     Arrival date & time 02/19/16  1206 History   First MD Initiated Contact with Patient 02/19/16 1351     Chief Complaint  Patient presents with  . Rash   (Consider location/radiation/quality/duration/timing/severity/associated sxs/prior Treatment) Patient is a 50 y.o. male presenting with rash. The history is provided by the patient. No language interpreter was used.  Rash Location:  Torso and shoulder/arm Shoulder/arm rash location:  L forearm, R forearm, L upper arm and R upper arm Torso rash location:  Abd RUQ and abd RLQ Quality: itchiness and redness   Quality: not blistering and not painful   Severity:  Moderate Onset quality:  Gradual Duration:  5 days Timing:  Constant Progression:  Spreading Chronicity:  New Context: plant contact   Context: not animal contact, not chemical exposure, not insect bite/sting, not medications and not new detergent/soap   Context comment:  He was clearing bushes last Tuesday which was followed by skin irritation. Relieved by:  Nothing Worsened by:  Nothing tried Ineffective treatments:  Anti-itch cream and antihistamines (Calamine lotion) Associated symptoms: no fever, no headaches, no joint pain, no nausea, no shortness of breath, no throat swelling, no tongue swelling, not vomiting and not wheezing     Past Medical History  Diagnosis Date  . Allergy   . Adult ADHD 08/24/2014    S/p eval by our psychologist Dr Lurline Hare - consistent with ADHD but mild case currently.     History reviewed. No pertinent past surgical history. Family History  Problem Relation Age of Onset  . Crohn's disease Mother   . Atrial fibrillation Mother   . COPD Father     smoker  . Hypertension Mother    Social History  Substance Use Topics  . Smoking status: Former Smoker    Types: Cigarettes    Quit date: 12/17/1993  . Smokeless tobacco: Never Used  . Alcohol Use: Yes    Review of Systems  Constitutional: Negative for fever.    Respiratory: Negative.  Negative for shortness of breath and wheezing.   Cardiovascular: Negative.   Gastrointestinal: Negative for nausea and vomiting.  Musculoskeletal: Negative.  Negative for arthralgias.  Skin: Positive for rash.  Neurological: Negative for headaches.  All other systems reviewed and are negative.   Allergies  Bee venom  Home Medications   Prior to Admission medications   Medication Sig Start Date End Date Taking? Authorizing Provider  amphetamine-dextroamphetamine (ADDERALL) 10 MG tablet Take 1 tablet (10 mg total) by mouth 2 (two) times daily. 01/31/16  Yes Ria Bush, MD  aspirin 81 MG tablet Take 81 mg by mouth daily.      Historical Provider, MD  cyclobenzaprine (FLEXERIL) 10 MG tablet Take 0.5-1 tablets (5-10 mg total) by mouth 2 (two) times daily as needed for muscle spasms (sedation precautions). 09/19/15   Ria Bush, MD  EPINEPHrine 0.3 mg/0.3 mL IJ SOAJ injection Inject 0.3 mLs (0.3 mg total) into the muscle once. 11/11/14   Ria Bush, MD  fexofenadine (ALLEGRA) 180 MG tablet Take 1 tablet (180 mg total) by mouth daily. 11/11/14   Ria Bush, MD  fluticasone (FLONASE) 50 MCG/ACT nasal spray Place 2 sprays into both nostrils daily. 11/11/14   Ria Bush, MD  Multiple Vitamin (MULTIVITAMIN) tablet Take 1 tablet by mouth daily.      Historical Provider, MD  naproxen (NAPROSYN) 500 MG tablet Take one po bid x 1 week then prn pain, take with food 09/19/15   Ria Bush, MD  Meds Ordered and Administered this Visit  Medications - No data to display  BP 137/79 mmHg  Pulse 78  Temp(Src) 98 F (36.7 C) (Oral)  Resp 20  SpO2 96% No data found.   Physical Exam  Constitutional: He appears well-developed. No distress.  Cardiovascular: Normal rate, regular rhythm, normal heart sounds and intact distal pulses.   No murmur heard. Pulmonary/Chest: Effort normal and breath sounds normal. No respiratory distress. He has no wheezes.   Skin: Rash noted. Rash is urticarial.     Nursing note and vitals reviewed.   ED Course  Procedures (including critical care time)  Labs Review Labs Reviewed - No data to display  Imaging Review No results found.   Visual Acuity Review  Right Eye Distance:   Left Eye Distance:   Bilateral Distance:    Right Eye Near:   Left Eye Near:    Bilateral Near:         MDM  No diagnosis found. Poison ivy  Wide spread rash. Oral and topical steroid prescribed. Continue OTC benadryl as needed for itching. Return precaution discussed.    Kinnie Feil, MD 02/19/16 1432

## 2016-03-21 ENCOUNTER — Encounter: Payer: Self-pay | Admitting: Family Medicine

## 2016-03-21 MED ORDER — AMPHETAMINE-DEXTROAMPHETAMINE 10 MG PO TABS: 10.0000 mg | ORAL_TABLET | Freq: Two times a day (BID) | ORAL | 0 refills | Status: DC

## 2016-03-21 NOTE — Telephone Encounter (Signed)
Requesting RF of Adderall. Last filled 01/31/16 #60 0RF. Last seen for this 11/11/14. Patient notified to schedule a physical. No UDS on file.

## 2016-03-21 NOTE — Telephone Encounter (Signed)
Printed and in [place]' box. Pt to schedule CPE.

## 2016-03-22 NOTE — Telephone Encounter (Signed)
Patient notified and Rx placed up front for pick up. 

## 2016-05-07 ENCOUNTER — Other Ambulatory Visit: Payer: Self-pay | Admitting: Family Medicine

## 2016-05-07 NOTE — Telephone Encounter (Signed)
Ok to refill? Last filled 03/21/16 #60 0RF

## 2016-05-08 MED ORDER — AMPHETAMINE-DEXTROAMPHETAMINE 10 MG PO TABS: 10.0000 mg | ORAL_TABLET | Freq: Two times a day (BID) | ORAL | 0 refills | Status: DC

## 2016-05-08 NOTE — Telephone Encounter (Signed)
Printed and in Kim's box 

## 2016-05-08 NOTE — Telephone Encounter (Signed)
Message left advising patient and Rx placed up front for pick up. 

## 2016-06-20 ENCOUNTER — Other Ambulatory Visit: Payer: Self-pay

## 2016-06-20 NOTE — Telephone Encounter (Signed)
Pt left v/m requesting rx for Adderall. Call when ready for pick up. rx last printed # 60 on 05/08/16. Last annual 11/11/14; pt has CPX scheduled 07/06/16.

## 2016-06-21 MED ORDER — AMPHETAMINE-DEXTROAMPHETAMINE 10 MG PO TABS: 10.0000 mg | ORAL_TABLET | Freq: Two times a day (BID) | ORAL | 0 refills | Status: DC

## 2016-06-21 NOTE — Telephone Encounter (Signed)
Patient notified and Rx placed up front for pick up. 

## 2016-06-21 NOTE — Telephone Encounter (Signed)
Printed and in Kim's box 

## 2016-07-02 ENCOUNTER — Other Ambulatory Visit: Payer: Self-pay | Admitting: Family Medicine

## 2016-07-03 ENCOUNTER — Other Ambulatory Visit (INDEPENDENT_AMBULATORY_CARE_PROVIDER_SITE_OTHER): Payer: BC Managed Care – PPO

## 2016-07-03 LAB — BASIC METABOLIC PANEL
BUN: 16 mg/dL (ref 6–23)
CALCIUM: 9.2 mg/dL (ref 8.4–10.5)
CO2: 26 mEq/L (ref 19–32)
CREATININE: 0.95 mg/dL (ref 0.40–1.50)
Chloride: 105 mEq/L (ref 96–112)
GFR: 89.71 mL/min (ref >60.00)
Glucose, Bld: 95 mg/dL (ref 70–99)
Potassium: 4 mEq/L (ref 3.5–5.1)
Sodium: 140 mEq/L (ref 135–145)

## 2016-07-03 LAB — TSH: TSH: 0.87 u[IU]/mL (ref 0.35–4.50)

## 2016-07-03 LAB — LIPID PANEL
CHOLESTEROL: 191 mg/dL (ref 0–200)
HDL: 41.9 mg/dL (ref >39.00)
LDL CALC: 127 mg/dL — AB (ref 0–99)
NonHDL: 149.3
TRIGLYCERIDES: 112 mg/dL (ref 0.0–149.0)
Total CHOL/HDL Ratio: 5
VLDL: 22.4 mg/dL (ref 0.0–40.0)

## 2016-07-06 ENCOUNTER — Encounter: Payer: Self-pay | Admitting: *Deleted

## 2016-07-06 ENCOUNTER — Ambulatory Visit (INDEPENDENT_AMBULATORY_CARE_PROVIDER_SITE_OTHER): Payer: BC Managed Care – PPO | Admitting: Family Medicine

## 2016-07-06 ENCOUNTER — Encounter: Payer: Self-pay | Admitting: Family Medicine

## 2016-07-06 VITALS — BP 132/84 | HR 78 | Temp 98.2°F | Ht 71.5 in | Wt 288.0 lb

## 2016-07-06 DIAGNOSIS — E669 Obesity, unspecified: Secondary | ICD-10-CM

## 2016-07-06 DIAGNOSIS — Z0000 Encounter for general adult medical examination without abnormal findings: Secondary | ICD-10-CM | POA: Diagnosis not present

## 2016-07-06 DIAGNOSIS — F909 Attention-deficit hyperactivity disorder, unspecified type: Secondary | ICD-10-CM

## 2016-07-06 MED ORDER — FLUTICASONE PROPIONATE 50 MCG/ACT NA SUSP: 2.0000 | Freq: Every day | NASAL | 11 refills | Status: DC

## 2016-07-06 MED ORDER — AMPHETAMINE-DEXTROAMPHETAMINE 10 MG PO TABS: 10.0000 mg | ORAL_TABLET | Freq: Two times a day (BID) | ORAL | 0 refills | Status: DC

## 2016-07-06 MED ORDER — FEXOFENADINE HCL 180 MG PO TABS: 180.0000 mg | ORAL_TABLET | Freq: Every day | ORAL | 11 refills | Status: DC

## 2016-07-06 NOTE — Assessment & Plan Note (Addendum)
Discussed healthy diet and lifestyle changes to affect sustainable weight loss. Previously on vegetarian diet he lost 40 lbs

## 2016-07-06 NOTE — Progress Notes (Signed)
BP 132/84   Pulse 78   Temp 98.2 F (36.8 C) (Oral)   Ht 5' 11.5" (1.816 m)   Wt 288 lb (130.6 kg)   SpO2 98%   BMI 39.61 kg/m    CC: CPE Subjective:    Patient ID: Nathan Camacho, male    DOB: June 27, 1966, 50 y.o.   MRN: [id number]  HPI: Nathan Camacho is a 50 y.o. male presenting on 07/06/2016 for Annual Exam ([facility] 06/2016) and Medication Refill (90 day supply, Adderall)   ADD dx by psychology. On adderall 10mg  BID PRN and tolerating this well. Feels adderall is effective. Some dry mouth, denies insomnia, chest pain, headache. Appetite ok.  Obesity - wants to return to vegetarian diet.   Preventative: Colon cancer screening - pt thinks father with rectal cancer - will verify and if this is the case we will refer for colonoscopy. Pt agrees.  Flu shot today Td 2008 Seat belt use discussed Sunscreen use discussed. No changing moles on skin. 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 Interim medical history since our last visit reviewed. Allergies and medications reviewed and updated. Current Outpatient Prescriptions on File Prior to Visit  Medication Sig  . aspirin 81 MG tablet Take 81 mg by mouth daily.    Marland Kitchen EPINEPHrine 0.3 mg/0.3 mL IJ SOAJ injection Inject 0.3 mLs (0.3 mg total) into the muscle once.  . Multiple Vitamin (MULTIVITAMIN) tablet Take 1 tablet by mouth daily.     No current facility-administered medications on file prior to visit.     Review of Systems  Constitutional: Negative for activity change, appetite change, chills, fatigue, fever and unexpected weight change.  HENT: Negative for hearing loss.   Eyes: Negative for visual disturbance.  Respiratory: Negative for cough,  chest tightness, shortness of breath and wheezing.   Cardiovascular: Negative for chest pain, palpitations and leg swelling.  Gastrointestinal: Negative for abdominal distention, abdominal pain, blood in stool, constipation, diarrhea, nausea and vomiting.  Genitourinary: Negative for difficulty urinating and hematuria.  Musculoskeletal: Negative for arthralgias, myalgias and neck pain.  Skin: Negative for rash.  Neurological: Negative for dizziness, seizures, syncope and headaches.  Hematological: Negative for adenopathy. Does not bruise/bleed easily.  Psychiatric/Behavioral: Negative for dysphoric mood. The patient is not nervous/anxious.    Per HPI unless specifically indicated in ROS section     Objective:    BP 132/84   Pulse 78   Temp 98.2 F (36.8 C) (Oral)   Ht 5' 11.5" (1.816 m)   Wt 288 lb (130.6 kg)   SpO2 98%   BMI 39.61 kg/m   Wt Readings from Last 3 Encounters:  07/06/16 288 lb (130.6 kg)  09/19/15 281 lb 8 oz (127.7 kg)  06/20/15 283 lb 4 oz (128.5 kg)    Physical Exam  Constitutional: He is oriented to person, place, and time. He appears well-developed and well-nourished. No distress.  HENT:  Head: Normocephalic and atraumatic.  Right Ear: Hearing, tympanic membrane, external ear and ear canal normal.  Left Ear: Hearing, tympanic membrane, external ear and ear canal normal.  Nose: Nose normal.  Mouth/Throat: Uvula is midline, oropharynx is clear and moist and mucous membranes are normal. No oropharyngeal exudate, posterior oropharyngeal edema or posterior oropharyngeal erythema.  Eyes: Conjunctivae and EOM are normal. Pupils are equal, round, and reactive to light. No scleral icterus.  Neck: Normal range of motion. Neck supple. No thyromegaly present.  Cardiovascular: Normal rate, regular rhythm, normal heart sounds and intact distal pulses.   No murmur heard. Pulses:      Radial pulses are 2+ on the right side, and 2+ on the left side.  Pulmonary/Chest:  Effort normal and breath sounds normal. No respiratory distress. He has no wheezes. He has no rales.  Abdominal: Soft. Bowel sounds are normal. He exhibits no distension and no mass. There is no tenderness. There is no rebound and no guarding.  Musculoskeletal: Normal range of motion. He exhibits no edema.  Lymphadenopathy:    He has no cervical adenopathy.  Neurological: He is alert and oriented to person, place, and time.  CN grossly intact, station and gait intact  Skin: Skin is warm and dry. No rash noted.  Psychiatric: He has a normal mood and affect. His behavior is normal. Judgment and thought content normal.  Nursing note and vitals reviewed.  Results for orders placed or performed in visit on 0000000  Basic metabolic panel  Result Value Ref Range   Sodium 140 135 - 145 mEq/L   Potassium 4.0 3.5 - 5.1 mEq/L   Chloride 105 96 - 112 mEq/L   CO2 26 19 - 32 mEq/L   Glucose, Bld 95 70 - 99 mg/dL   BUN 16 6 - 23 mg/dL   Creatinine, Ser 0.95 0.40 - 1.50 mg/dL   Calcium 9.2 8.4 - 10.5 mg/dL   GFR 89.71 >60.00 mL/min  Lipid panel  Result Value Ref Range   Cholesterol 191 0 - 200 mg/dL   Triglycerides 112.0 0.0 - 149.0 mg/dL   HDL 41.90 >39.00 mg/dL   VLDL 22.4 0.0 - 40.0 mg/dL   LDL Cholesterol 127 (H) 0 - 99 mg/dL   Total CHOL/HDL Ratio 5    NonHDL 149.30   TSH  Result Value Ref Range   TSH 0.87 0.35 - 4.50 uIU/mL      Assessment & Plan:   Problem List Items Addressed This Visit    Adult ADHD    Tolerating medication well, taking appropriately, effective. Continue.  Update controlled substance agreement and UDS today.  Discussed benefits vs risks of controlled stimulant.      Health maintenance examination - Primary    Preventative protocols reviewed and updated unless pt declined. Discussed healthy diet and lifestyle.       Obesity, Class II, BMI 35-39.9, no comorbidity    Discussed healthy diet and lifestyle changes to affect sustainable weight loss. Previously  on vegetarian diet he lost 40 lbs       Relevant Medications   amphetamine-dextroamphetamine (ADDERALL) 10 MG tablet       Follow up plan: Return in about 1 year (around 07/06/2017) for annual exam, prior fasting for blood work.  Ria Bush, MD

## 2016-07-06 NOTE — Patient Instructions (Addendum)
Check on father's history of rectal cancer - if this is the case, let us know and we will refer you for colonoscopy.  Work towards healthy diet and lifestyle changes.  Controlled substance agreement/UDS today.  Good to see you today, call us with questions.  Health Maintenance, Male A healthy lifestyle and preventative care can promote health and wellness.  Maintain regular health, dental, and eye exams.  Eat a healthy diet. Foods like vegetables, fruits, whole grains, low-fat dairy products, and lean protein foods contain the nutrients you need and are low in calories. Decrease your intake of foods high in solid fats, added sugars, and salt. Get information about a proper diet from your health care provider, if necessary.  Regular physical exercise is one of the most important things you can do for your health. Most adults should get at least 150 minutes of moderate-intensity exercise (any activity that increases your heart rate and causes you to sweat) each week. In addition, most adults need muscle-strengthening exercises on 2 or more days a week.   Maintain a healthy weight. The body mass index (BMI) is a screening tool to identify possible weight problems. It provides an estimate of body fat based on height and weight. Your health care provider can find your BMI and can help you achieve or maintain a healthy weight. For males 20 years and older:  A BMI below 18.5 is considered underweight.  A BMI of 18.5 to 24.9 is normal.  A BMI of 25 to 29.9 is considered overweight.  A BMI of 30 and above is considered obese.  Maintain normal blood lipids and cholesterol by exercising and minimizing your intake of saturated fat. Eat a balanced diet with plenty of fruits and vegetables. Blood tests for lipids and cholesterol should begin at age 15 and be repeated every 5 years. If your lipid or cholesterol levels are high, you are over age 76, or you are at high risk for heart disease, you may need  your cholesterol levels checked more frequently.Ongoing high lipid and cholesterol levels should be treated with medicines if diet and exercise are not working.  If you smoke, find out from your health care provider how to quit. If you do not use tobacco, do not start.  Lung cancer screening is recommended for adults aged 47-80 years who are at high risk for developing lung cancer because of a history of smoking. A yearly low-dose CT scan of the lungs is recommended for people who have at least a 30-pack-year history of smoking and are current smokers or have quit within the past 15 years. A pack year of smoking is smoking an average of 1 pack of cigarettes a day for 1 year (for example, a 30-pack-year history of smoking could mean smoking 1 pack a day for 30 years or 2 packs a day for 15 years). Yearly screening should continue until the smoker has stopped smoking for at least 15 years. Yearly screening should be stopped for people who develop a health problem that would prevent them from having lung cancer treatment.  If you choose to drink alcohol, do not have more than 2 drinks per day. One drink is considered to be 12 oz (360 mL) of beer, 5 oz (150 mL) of wine, or 1.5 oz (45 mL) of liquor.  Avoid the use of street drugs. Do not share needles with anyone. Ask for help if you need support or instructions about stopping the use of drugs.  High blood pressure causes  heart disease and increases the risk of stroke. High blood pressure is more likely to develop in:  People who have blood pressure in the end of the normal range (100-139/85-89 mm Hg).  People who are overweight or obese.  People who are African American.  If you are 4-42 years of age, have your blood pressure checked every 3-5 years. If you are 24 years of age or older, have your blood pressure checked every year. You should have your blood pressure measured twice-once when you are at a hospital or clinic, and once when you are not  at a hospital or clinic. Record the average of the two measurements. To check your blood pressure when you are not at a hospital or clinic, you can use:  An automated blood pressure machine at a pharmacy.  A home blood pressure monitor.  If you are 71-46 years old, ask your health care provider if you should take aspirin to prevent heart disease.  Diabetes screening involves taking a blood sample to check your fasting blood sugar level. This should be done once every 3 years after age 43 if you are at a normal weight and without risk factors for diabetes. Testing should be considered at a younger age or be carried out more frequently if you are overweight and have at least 1 risk factor for diabetes.  Colorectal cancer can be detected and often prevented. Most routine colorectal cancer screening begins at the age of 65 and continues through age 29. However, your health care provider may recommend screening at an earlier age if you have risk factors for colon cancer. On a yearly basis, your health care provider may provide home test kits to check for hidden blood in the stool. A small camera at the end of a tube may be used to directly examine the colon (sigmoidoscopy or colonoscopy) to detect the earliest forms of colorectal cancer. Talk to your health care provider about this at age 31 when routine screening begins. A direct exam of the colon should be repeated every 5-10 years through age 55, unless early forms of precancerous polyps or small growths are found.  People who are at an increased risk for hepatitis B should be screened for this virus. You are considered at high risk for hepatitis B if:  You were born in a country where hepatitis B occurs often. Talk with your health care provider about which countries are considered high risk.  Your parents were born in a high-risk country and you have not received a shot to protect against hepatitis B (hepatitis B vaccine).  You have HIV or  AIDS.  You use needles to inject street drugs.  You live with, or have sex with, someone who has hepatitis B.  You are a man who has sex with other men (MSM).  You get hemodialysis treatment.  You take certain medicines for conditions like cancer, organ transplantation, and autoimmune conditions.  Hepatitis C blood testing is recommended for all people born from [birth year] through 1963 and any individual with known risk factors for hepatitis C.  Healthy men should no longer receive prostate-specific antigen (PSA) blood tests as part of routine cancer screening. Talk to your health care provider about prostate cancer screening.  Testicular cancer screening is not recommended for adolescents or adult males who have no symptoms. Screening includes self-exam, a health care provider exam, and other screening tests. Consult with your health care provider about any symptoms you have or any concerns you have about  testicular cancer.  Practice safe sex. Use condoms and avoid high-risk sexual practices to reduce the spread of sexually transmitted infections (STIs).  You should be screened for STIs, including gonorrhea and chlamydia if:  You are sexually active and are younger than 24 years.  You are older than 24 years, and your health care provider tells you that you are at risk for this type of infection.  Your sexual activity has changed since you were last screened, and you are at an increased risk for chlamydia or gonorrhea. Ask your health care provider if you are at risk.  If you are at risk of being infected with HIV, it is recommended that you take a prescription medicine daily to prevent HIV infection. This is called pre-exposure prophylaxis (PrEP). You are considered at risk if:  You are a man who has sex with other men (MSM).  You are a heterosexual man who is sexually active with multiple partners.  You take drugs by injection.  You are sexually active with a partner who has  HIV.  Talk with your health care provider about whether you are at high risk of being infected with HIV. If you choose to begin PrEP, you should first be tested for HIV. You should then be tested every 3 months for as long as you are taking PrEP.  Use sunscreen. Apply sunscreen liberally and repeatedly throughout the day. You should seek shade when your shadow is shorter than you. Protect yourself by wearing long sleeves, pants, a wide-brimmed hat, and sunglasses year round whenever you are outdoors.  Tell your health care provider of new moles or changes in moles, especially if there is a change in shape or color. Also, tell your health care provider if a mole is larger than the size of a pencil eraser.  A one-time screening for abdominal aortic aneurysm (AAA) and surgical repair of large AAAs by ultrasound is recommended for men aged 56-75 years who are current or former smokers.  Stay current with your vaccines (immunizations). This information is not intended to replace advice given to you by your health care provider. Make sure you discuss any questions you have with your health care provider. Document Released: 01/19/2008 Document Revised: 08/13/2014 Document Reviewed: 04/26/2015 Elsevier Interactive Patient Education  2017 [employer].

## 2016-07-06 NOTE — Assessment & Plan Note (Signed)
Preventative protocols reviewed and updated unless pt declined. Discussed healthy diet and lifestyle.  

## 2016-07-06 NOTE — Assessment & Plan Note (Signed)
Tolerating medication well, taking appropriately, effective. Continue.  Update controlled substance agreement and UDS today.  Discussed benefits vs risks of controlled stimulant.

## 2016-07-09 ENCOUNTER — Encounter: Payer: Self-pay | Admitting: Family Medicine

## 2016-08-27 ENCOUNTER — Encounter: Payer: Self-pay | Admitting: Family Medicine

## 2016-08-27 ENCOUNTER — Ambulatory Visit (INDEPENDENT_AMBULATORY_CARE_PROVIDER_SITE_OTHER): Payer: BC Managed Care – PPO | Admitting: Family Medicine

## 2016-08-27 VITALS — BP 110/66 | HR 96 | Temp 98.6°F | Ht 71.5 in | Wt 300.2 lb

## 2016-08-27 DIAGNOSIS — R05 Cough: Secondary | ICD-10-CM | POA: Diagnosis not present

## 2016-08-27 DIAGNOSIS — J00 Acute nasopharyngitis [common cold]: Secondary | ICD-10-CM

## 2016-08-27 DIAGNOSIS — R059 Cough, unspecified: Secondary | ICD-10-CM

## 2016-08-27 LAB — POC INFLUENZA A&B (BINAX/QUICKVUE)
Influenza A, POC: NEGATIVE
Influenza B, POC: NEGATIVE

## 2016-08-27 NOTE — Progress Notes (Signed)
Dr. Frederico Hamman T. Juanmiguel Defelice, MD, [facility] [place], [address] Phone: [phone] Fax: [phone]  08/27/2016  Patient: Nathan Camacho, MRN: [id number], DOB: 1965/09/20, 51 y.o.  Primary Physician:  Ria Bush, MD   Chief Complaint  Patient presents with  . Cough    with chest congestion-Just got back from Cruise yesterday  . Generalized Body Aches   Subjective:   This 51 y.o. male patient presents with runny nose, sneezing, cough, sore throat, malaise and minimal / low-grade fever .  Dry cough yesterday, drove up from [place] - now feeling a lot worse. Sinuses are clear.  No fever - felt warm.  Having some muscle aches and joint aches.   Recent cruise - recent exposure to others with similar symptoms.   The patent denies sore throat as the primary complaint. Denies sthortness of breath/wheezing, high fever, chest pain, rhinits for more than 14 days, significant myalgia, otalgia, facial pain, abdominal pain, changes in bowel or bladder.  PMH, PHS, Allergies, Problem List, Medications, Family History, and Social History have all been reviewed.  Patient Active Problem List   Diagnosis Date Noted  . Health maintenance examination 11/11/2014  . Adult ADHD 08/24/2014  . Obesity, Class II, BMI 35-39.9, no comorbidity 03/27/2013  . ALLERGIC RHINITIS 12/04/2006    Past Medical History:  Diagnosis Date  . Adult ADHD 08/24/2014   S/p eval by our psychologist Dr Lurline Hare - consistent with ADHD but mild case currently.    . Allergy     No past surgical history on file.  Social History   Social History  . Marital status: Married    Spouse name: N/A  . Number of children: N/A  . Years of education: N/A   Occupational History  . Not on file.   Social History Main Topics  . Smoking status: Former Smoker    Types: Cigarettes    Quit date: 12/17/1993  . Smokeless tobacco: Never Used  . Alcohol use Yes  .  Drug use: No  . Sexual activity: Not on file   Other Topics Concern  . 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 Crohn's disease Mother   . Atrial fibrillation Mother   . Hypertension Mother   . COPD Father     smoker  . Cancer Father 58    rectal  . Cancer Paternal Grandfather     stomach    Allergies  Allergen Reactions  . Bee Venom    Medication list reviewed and updated in full in [place].  ROS as above, eating and drinking - tolerating PO. Urinating normally. No excessive vomitting or diarrhea. O/w as above.  Objective:   Blood pressure 110/66, pulse 96, temperature 98.6 F (37 C), temperature source Oral, height 5' 11.5" (1.816 m), weight (!) 300 lb 4 oz (136.2 kg), SpO2 97 %.  GEN: WDWN, Non-toxic, Atraumatic, normocephalic. A and O x 3. HEENT: Oropharynx clear without exudate, MMM, no significant LAD, mild rhinnorhea Ears: TM clear, COL visualized with good landmarks CV: RRR, no m/g/r. Pulm: CTA B, no wheezes, rhonchi, or crackles, normal respiratory effort. EXT: no c/c/e Psych: well oriented, neither depressed nor anxious  in appearance  Objective Data: Results for orders placed or performed in visit on 08/27/16  POC Influenza A&B (Binax test)  Result Value Ref Range   Influenza A, POC Negative Negative   Influenza B, POC Negative Negative    Assessment and Plan:   Acute nasopharyngitis  Cough - Plan: POC Influenza A&B (Binax test)  Supportive care reviewed with patient. See patient instruction section.  Follow-up: No Follow-up on file.  Orders Placed This Encounter  Procedures  . POC Influenza A&B (Binax test)    Signed,  Belicia Difatta T. Teaghan Formica, MD   Patient's Medications  New Prescriptions   No  medications on file  Previous Medications   AMPHETAMINE-DEXTROAMPHETAMINE (ADDERALL) 10 MG TABLET    Take 1 tablet (10 mg total) by mouth 2 (two) times daily.   ASPIRIN 81 MG TABLET    Take 81 mg by mouth daily.     EPINEPHRINE 0.3 MG/0.3 ML IJ SOAJ INJECTION    Inject 0.3 mLs (0.3 mg total) into the muscle once.   FEXOFENADINE (ALLEGRA) 180 MG TABLET    Take 1 tablet (180 mg total) by mouth daily.   FLUTICASONE (FLONASE) 50 MCG/ACT NASAL SPRAY    Place 2 sprays into both nostrils daily.   MULTIPLE VITAMIN (MULTIVITAMIN) TABLET    Take 1 tablet by mouth daily.    Modified Medications   No medications on file  Discontinued Medications   No medications on file

## 2016-08-27 NOTE — Progress Notes (Signed)
Pre visit review using our clinic review tool, if applicable. No additional management support is needed unless otherwise documented below in the visit note. 

## 2016-09-07 ENCOUNTER — Other Ambulatory Visit: Payer: Self-pay | Admitting: Family Medicine

## 2016-09-07 MED ORDER — AMPHETAMINE-DEXTROAMPHETAMINE 10 MG PO TABS: 10.0000 mg | ORAL_TABLET | Freq: Two times a day (BID) | ORAL | 0 refills | Status: DC

## 2016-09-07 NOTE — Telephone Encounter (Signed)
Last written 07-06-16 Rx stated do not fill until 07-21-16 #60 Last OV 07-06-16 Next OV 07-08-17

## 2016-09-07 NOTE — Telephone Encounter (Signed)
Printed and in Kim's box 

## 2016-09-07 NOTE — Telephone Encounter (Signed)
Spoke with patient and notified that R/X for adderall is ready for pick up.

## 2016-10-16 ENCOUNTER — Other Ambulatory Visit: Payer: Self-pay | Admitting: Family Medicine

## 2016-10-16 NOTE — Telephone Encounter (Signed)
Ok to refill? Last filled 09/07/16 #60 0RF

## 2016-10-17 MED ORDER — AMPHETAMINE-DEXTROAMPHETAMINE 10 MG PO TABS: 10.0000 mg | ORAL_TABLET | Freq: Two times a day (BID) | ORAL | 0 refills | Status: DC

## 2016-10-17 NOTE — Telephone Encounter (Signed)
rx placed in blue folder ready for pick up, pt aware.

## 2016-10-17 NOTE — Telephone Encounter (Signed)
Printed and in kims'box  

## 2016-11-27 ENCOUNTER — Other Ambulatory Visit: Payer: Self-pay | Admitting: Family Medicine

## 2016-11-28 ENCOUNTER — Other Ambulatory Visit: Payer: Self-pay | Admitting: *Deleted

## 2016-11-28 MED ORDER — AMPHETAMINE-DEXTROAMPHETAMINE 10 MG PO TABS: 10.0000 mg | ORAL_TABLET | Freq: Two times a day (BID) | ORAL | 0 refills | Status: DC

## 2016-11-28 NOTE — Telephone Encounter (Signed)
Printed and in Kim's box 

## 2016-11-28 NOTE — Telephone Encounter (Signed)
Ok to refill? Last filled 10/17/16 #60 0RF

## 2016-11-28 NOTE — Telephone Encounter (Signed)
Patient notified and Rx placed up front for pick up. 

## 2016-12-25 ENCOUNTER — Other Ambulatory Visit: Payer: Self-pay | Admitting: Family Medicine

## 2016-12-25 MED ORDER — AMPHETAMINE-DEXTROAMPHETAMINE 10 MG PO TABS: 10.0000 mg | ORAL_TABLET | Freq: Two times a day (BID) | ORAL | 0 refills | Status: DC

## 2016-12-25 NOTE — Telephone Encounter (Signed)
Last office visit 08/27/2016 with Dr. Lorelei Pont.  Last refilled 11/28/2016 for #60 with no refills.  Ok to refill?

## 2016-12-25 NOTE — Telephone Encounter (Signed)
Called patient enroute to pick up RX.

## 2016-12-25 NOTE — Telephone Encounter (Signed)
printed and in CMA box.  

## 2017-02-04 ENCOUNTER — Other Ambulatory Visit: Payer: Self-pay | Admitting: Family Medicine

## 2017-02-04 MED ORDER — AMPHETAMINE-DEXTROAMPHETAMINE 10 MG PO TABS: 10.0000 mg | ORAL_TABLET | Freq: Two times a day (BID) | ORAL | 0 refills | Status: DC

## 2017-02-04 NOTE — Telephone Encounter (Signed)
Printed and in CMA box 

## 2017-02-04 NOTE — Telephone Encounter (Signed)
Last filled 12-25-16 Last OV/CPE 12-1-7 Next OV 07-08-17 Last UDS 07-09-16

## 2017-03-19 ENCOUNTER — Other Ambulatory Visit: Payer: Self-pay | Admitting: Family Medicine

## 2017-03-19 NOTE — Telephone Encounter (Signed)
MyChart refill request for Adderall 10 mg Last office visit:   07/06/16 CPE, 1 acute visit since with Dr. Lorelei Pont Last Filled:    60 tablet 0 02/04/2017  Please advise.

## 2017-03-20 MED ORDER — AMPHETAMINE-DEXTROAMPHETAMINE 10 MG PO TABS: 10.0000 mg | ORAL_TABLET | Freq: Two times a day (BID) | ORAL | 0 refills | Status: DC

## 2017-03-20 NOTE — Telephone Encounter (Signed)
Printed and in CMA box 

## 2017-03-20 NOTE — Telephone Encounter (Signed)
Lm on pts vm informing him Rx is available for pickup from the front desk. Pt advised third party unable to pickup 

## 2017-03-21 ENCOUNTER — Encounter: Payer: Self-pay | Admitting: Family Medicine

## 2017-05-06 ENCOUNTER — Other Ambulatory Visit: Payer: Self-pay | Admitting: Family Medicine

## 2017-05-06 MED ORDER — AMPHETAMINE-DEXTROAMPHETAMINE 10 MG PO TABS: 10.0000 mg | ORAL_TABLET | Freq: Two times a day (BID) | ORAL | 0 refills | Status: DC

## 2017-05-06 NOTE — Telephone Encounter (Addendum)
Left message on vm asking pt to cb. [Rx placed at front office.]

## 2017-05-06 NOTE — Telephone Encounter (Signed)
Printed and in CMA box 

## 2017-05-06 NOTE — Telephone Encounter (Signed)
Last printed:  03/20/17, #60 Last OV (acute): 08/27/16 Next OV (CPE):  07/08/17

## 2017-05-10 NOTE — Telephone Encounter (Signed)
Spoke with pt, says he has already picked up rx.

## 2017-06-18 ENCOUNTER — Other Ambulatory Visit: Payer: Self-pay | Admitting: Family Medicine

## 2017-06-18 NOTE — Telephone Encounter (Signed)
Last printed:  05/06/17, #60 Last OV (CPE):  07/06/16 Next OV:  07/08/17

## 2017-06-19 MED ORDER — AMPHETAMINE-DEXTROAMPHETAMINE 10 MG PO TABS: 10.0000 mg | ORAL_TABLET | Freq: Two times a day (BID) | ORAL | 0 refills | Status: DC

## 2017-06-19 NOTE — Telephone Encounter (Signed)
Printed and in Lisa's box.  

## 2017-07-04 ENCOUNTER — Other Ambulatory Visit: Payer: Self-pay | Admitting: Family Medicine

## 2017-07-04 DIAGNOSIS — E669 Obesity, unspecified: Secondary | ICD-10-CM

## 2017-07-05 ENCOUNTER — Other Ambulatory Visit: Payer: BC Managed Care – PPO

## 2017-07-08 ENCOUNTER — Encounter: Payer: Self-pay | Admitting: Family Medicine

## 2017-07-22 ENCOUNTER — Other Ambulatory Visit (INDEPENDENT_AMBULATORY_CARE_PROVIDER_SITE_OTHER): Payer: BC Managed Care – PPO

## 2017-07-22 DIAGNOSIS — E669 Obesity, unspecified: Secondary | ICD-10-CM

## 2017-07-22 LAB — LIPID PANEL
CHOLESTEROL: 184 mg/dL (ref 0–200)
HDL: 42.1 mg/dL (ref >39.00)
LDL Cholesterol: 105 mg/dL — ABNORMAL HIGH (ref 0–99)
NONHDL: 141.79
Total CHOL/HDL Ratio: 4
Triglycerides: 184 mg/dL — ABNORMAL HIGH (ref 0.0–149.0)
VLDL: 36.8 mg/dL (ref 0.0–40.0)

## 2017-07-22 LAB — BASIC METABOLIC PANEL
BUN: 18 mg/dL (ref 6–23)
CO2: 27 mEq/L (ref 19–32)
CREATININE: 0.93 mg/dL (ref 0.40–1.50)
Calcium: 9.1 mg/dL (ref 8.4–10.5)
Chloride: 105 mEq/L (ref 96–112)
GFR: 91.54 mL/min (ref >60.00)
Glucose, Bld: 94 mg/dL (ref 70–99)
Potassium: 4.3 mEq/L (ref 3.5–5.1)
Sodium: 140 mEq/L (ref 135–145)

## 2017-07-22 LAB — TSH: TSH: 1.78 u[IU]/mL (ref 0.35–4.50)

## 2017-07-24 ENCOUNTER — Ambulatory Visit (INDEPENDENT_AMBULATORY_CARE_PROVIDER_SITE_OTHER): Payer: BC Managed Care – PPO | Admitting: Family Medicine

## 2017-07-24 ENCOUNTER — Encounter: Payer: Self-pay | Admitting: Family Medicine

## 2017-07-24 VITALS — BP 128/80 | HR 62 | Temp 98.3°F | Ht 71.5 in | Wt 292.0 lb

## 2017-07-24 DIAGNOSIS — Z23 Encounter for immunization: Secondary | ICD-10-CM

## 2017-07-24 DIAGNOSIS — Z0000 Encounter for general adult medical examination without abnormal findings: Secondary | ICD-10-CM | POA: Diagnosis not present

## 2017-07-24 DIAGNOSIS — F909 Attention-deficit hyperactivity disorder, unspecified type: Secondary | ICD-10-CM

## 2017-07-24 DIAGNOSIS — E785 Hyperlipidemia, unspecified: Secondary | ICD-10-CM

## 2017-07-24 MED ORDER — FLUTICASONE PROPIONATE 50 MCG/ACT NA SUSP: 2.0000 | Freq: Every day | NASAL | 11 refills | Status: DC

## 2017-07-24 MED ORDER — FEXOFENADINE HCL 180 MG PO TABS: 180.0000 mg | ORAL_TABLET | Freq: Every day | ORAL | 11 refills | Status: AC

## 2017-07-24 MED ORDER — EPINEPHRINE 0.3 MG/0.3ML IJ SOAJ: 0.3000 mg | Freq: Once | INTRAMUSCULAR | 1 refills | Status: AC

## 2017-07-24 NOTE — Assessment & Plan Note (Signed)
Discussed healthy diet and lifestyle changes to affect  sustainable weight loss. He tries to walk 1 mile 3 d/wk at work.

## 2017-07-24 NOTE — Patient Instructions (Addendum)
Flu shot today and Tdap today.  Decrease added sugars, eliminate trans fats, increase fiber and limit alcohol. Increase fatty fish in diet (salmon, tuna, sardines, trout). All these changes together can drop triglycerides by almost 50%.  You are doing well today. Return as needed or in 1 year for next physical.  Health Maintenance, Male A healthy lifestyle and preventive care is important for your health and wellness. Ask your health care provider about what schedule of regular examinations is right for you. What should I know about weight and diet? Eat a Healthy Diet  Eat plenty of vegetables, fruits, whole grains, low-fat dairy products, and lean protein.  Do not eat a lot of foods high in solid fats, added sugars, or salt.  Maintain a Healthy Weight Regular exercise can help you achieve or maintain a healthy weight. You should:  Do at least 150 minutes of exercise each week. The exercise should increase your heart rate and make you sweat (moderate-intensity exercise).  Do strength-training exercises at least twice a week.  Watch Your Levels of Cholesterol and Blood Lipids  Have your blood tested for lipids and cholesterol every 5 years starting at 49 years of age. If you are at high risk for heart disease, you should start having your blood tested when you are 49 years old. You may need to have your cholesterol levels checked more often if: ? Your lipid or cholesterol levels are high. ? You are older than 49 years of age. ? You are at high risk for heart disease.  What should I know about cancer screening? Many types of cancers can be detected early and may often be prevented. Lung Cancer  You should be screened every year for lung cancer if: ? You are a current smoker who has smoked for at least 30 years. ? You are a former smoker who has quit within the past 15 years.  Talk to your health care provider about your screening options, when you should start screening, and how  often you should be screened.  Colorectal Cancer  Routine colorectal cancer screening usually begins at 49 years of age and should be repeated every 5-10 years until you are 49 years old. You may need to be screened more often if early forms of precancerous polyps or small growths are found. Your health care provider may recommend screening at an earlier age if you have risk factors for colon cancer.  Your health care provider may recommend using home test kits to check for hidden blood in the stool.  A small camera at the end of a tube can be used to examine your colon (sigmoidoscopy or colonoscopy). This checks for the earliest forms of colorectal cancer.  Prostate and Testicular Cancer  Depending on your age and overall health, your health care provider may do certain tests to screen for prostate and testicular cancer.  Talk to your health care provider about any symptoms or concerns you have about testicular or prostate cancer.  Skin Cancer  Check your skin from head to toe regularly.  Tell your health care provider about any new moles or changes in moles, especially if: ? There is a change in a mole's size, shape, or color. ? You have a mole that is larger than a pencil eraser.  Always use sunscreen. Apply sunscreen liberally and repeat throughout the day.  Protect yourself by wearing long sleeves, pants, a wide-brimmed hat, and sunglasses when outside.  What should I know about heart disease, diabetes, and  high blood pressure?  If you are 60-62 years of age, have your blood pressure checked every 3-5 years. If you are 17 years of age or older, have your blood pressure checked every year. You should have your blood pressure measured twice-once when you are at a hospital or clinic, and once when you are not at a hospital or clinic. Record the average of the two measurements. To check your blood pressure when you are not at a hospital or clinic, you can use: ? An automated blood  pressure machine at a pharmacy. ? A home blood pressure monitor.  Talk to your health care provider about your target blood pressure.  If you are between 85-37 years old, ask your health care provider if you should take aspirin to prevent heart disease.  Have regular diabetes screenings by checking your fasting blood sugar level. ? If you are at a normal weight and have a low risk for diabetes, have this test once every three years after the age of 78. ? If you are overweight and have a high risk for diabetes, consider being tested at a younger age or more often.  A one-time screening for abdominal aortic aneurysm (AAA) by ultrasound is recommended for men aged 60-75 years who are current or former smokers. What should I know about preventing infection? Hepatitis B If you have a higher risk for hepatitis B, you should be screened for this virus. Talk with your health care provider to find out if you are at risk for hepatitis B infection. Hepatitis C Blood testing is recommended for:  Everyone born from [birth year] through 1963.  Anyone with known risk factors for hepatitis C.  Sexually Transmitted Diseases (STDs)  You should be screened each year for STDs including gonorrhea and chlamydia if: ? You are sexually active and are younger than 49 years of age. ? You are older than 49 years of age and your health care provider tells you that you are at risk for this type of infection. ? Your sexual activity has changed since you were last screened and you are at an increased risk for chlamydia or gonorrhea. Ask your health care provider if you are at risk.  Talk with your health care provider about whether you are at high risk of being infected with HIV. Your health care provider may recommend a prescription medicine to help prevent HIV infection.  What else can I do?  Schedule regular health, dental, and eye exams.  Stay current with your vaccines (immunizations).  Do not use any tobacco  products, such as cigarettes, chewing tobacco, and e-cigarettes. If you need help quitting, ask your health care provider.  Limit alcohol intake to no more than 2 drinks per day. One drink equals 12 ounces of beer, 5 ounces of wine, or 1 ounces of hard liquor.  Do not use street drugs.  Do not share needles.  Ask your health care provider for help if you need support or information about quitting drugs.  Tell your health care provider if you often feel depressed.  Tell your health care provider if you have ever been abused or do not feel safe at home. This information is not intended to replace advice given to you by your health care provider. Make sure you discuss any questions you have with your health care provider. Document Released: 01/19/2008 Document Revised: 03/21/2016 Document Reviewed: 04/26/2015 Elsevier Interactive Patient Education  [employer].

## 2017-07-24 NOTE — Progress Notes (Signed)
BP 128/80 (BP Location: Left Arm, Patient Position: Sitting, Cuff Size: Large)   Pulse 62   Temp 98.3 F (36.8 C) (Oral)   Ht 5' 11.5" (1.816 m)   Wt 292 lb (132.5 kg)   SpO2 97%   BMI 40.16 kg/m    CC: CPE Subjective:    Patient ID: Nathan Camacho, male    DOB: 1965/09/03, 51 y.o.   MRN: [id number]  HPI: Nathan Camacho is a 51 y.o. male presenting on 07/24/2017 for Annual Exam   ADD dx by psychology. On adderall 10mg  BID (5am and noon-1pm) on week days and tolerating this well. Weekend holidays. Feels adderall is effective. Some dry mouth, denies insomnia, chest pain, headache. Appetite ok.   Would like spot L posterior thigh evaluated present for a year.   Preventative: Colon cancer screening - father with h/o anal cancer. Colonoscopy will be recommended at age 32.  Flu shot today Td 2008. Tdap today. Seat belt use discussed Sunscreen use discussed. No changing moles on skin. 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 Interim medical history since our last visit reviewed. Allergies and medications reviewed and updated. Outpatient Medications Prior to Visit  Medication Sig Dispense Refill  . amphetamine-dextroamphetamine (ADDERALL) 10 MG tablet Take 1 tablet (10 mg total) 2 (two) times daily by mouth. 60 tablet 0  . aspirin 81 MG tablet Take 81 mg by mouth daily.      . Multiple Vitamin (MULTIVITAMIN) tablet Take 1 tablet by mouth daily.      Marland Kitchen EPINEPHrine 0.3 mg/0.3 mL IJ SOAJ injection Inject 0.3 mLs (0.3 mg total) into the muscle once. 1 Device 2  . fexofenadine (ALLEGRA) 180 MG tablet Take 1 tablet (180 mg total) by mouth daily. 30 tablet 11  . fluticasone (FLONASE) 50 MCG/ACT nasal spray Place 2  sprays into both nostrils daily. 16 g 11   No facility-administered medications prior to visit.      Per HPI unless specifically indicated in ROS section below Review of Systems  Constitutional: Negative for activity change, appetite change, chills, fatigue, fever and unexpected weight change.  HENT: Negative for hearing loss.   Eyes: Negative for visual disturbance.  Respiratory: Negative for cough, chest tightness, shortness of breath and wheezing.   Cardiovascular: Negative for chest pain, palpitations and leg swelling.  Gastrointestinal: Negative for abdominal distention, abdominal pain, blood in stool, constipation, diarrhea, nausea and vomiting.  Genitourinary: Negative for difficulty urinating and hematuria.  Musculoskeletal: Negative for arthralgias, myalgias and neck pain.  Skin: Negative for rash.  Neurological: Negative for dizziness, seizures, syncope and headaches.  Hematological: Negative for adenopathy. Does not bruise/bleed easily.  Psychiatric/Behavioral: Negative for dysphoric mood. The patient is not nervous/anxious.        Objective:    BP 128/80 (BP Location: Left Arm, Patient Position: Sitting, Cuff Size: Large)   Pulse 62   Temp 98.3 F (36.8 C) (Oral)   Ht 5' 11.5" (1.816 m)   Wt 292 lb (132.5 kg)   SpO2 97%   BMI 40.16 kg/m   Wt Readings from Last 3 Encounters:  07/24/17 292 lb (132.5 kg)  08/27/16 (!) 300 lb 4 oz (136.2 kg)  07/06/16 288 lb (130.6 kg)    Physical  Exam  Constitutional: He is oriented to person, place, and time. He appears well-developed and well-nourished. No distress.  HENT:  Head: Normocephalic and atraumatic.  Right Ear: Hearing, tympanic membrane, external ear and ear canal normal.  Left Ear: Hearing, tympanic membrane, external ear and ear canal normal.  Nose: Nose normal.  Mouth/Throat: Uvula is midline, oropharynx is clear and moist and mucous membranes are normal. No oropharyngeal exudate, posterior oropharyngeal edema or  posterior oropharyngeal erythema.  Eyes: Conjunctivae and EOM are normal. Pupils are equal, round, and reactive to light. No scleral icterus.  Neck: Normal range of motion. Neck supple. No thyromegaly present.  Cardiovascular: Normal rate, regular rhythm, normal heart sounds and intact distal pulses.  No murmur heard. Pulses:      Radial pulses are 2+ on the right side, and 2+ on the left side.  Pulmonary/Chest: Effort normal and breath sounds normal. No respiratory distress. He has no wheezes. He has no rales.  Abdominal: Soft. Bowel sounds are normal. He exhibits no distension and no mass. There is no tenderness. There is no rebound and no guarding.  Musculoskeletal: Normal range of motion. He exhibits no edema.  Lymphadenopathy:    He has no cervical adenopathy.  Neurological: He is alert and oriented to person, place, and time.  CN grossly intact, station and gait intact  Skin: Skin is warm and dry. No rash noted.  L posterior mid thigh with small indurated erosion without surrounding erythema or warmth or fluctuance  Psychiatric: He has a normal mood and affect. His behavior is normal. Judgment and thought content normal.  Nursing note and vitals reviewed.  Results for orders placed or performed in visit on 70/35/00  Basic metabolic panel  Result Value Ref Range   Sodium 140 135 - 145 mEq/L   Potassium 4.3 3.5 - 5.1 mEq/L   Chloride 105 96 - 112 mEq/L   CO2 27 19 - 32 mEq/L   Glucose, Bld 94 70 - 99 mg/dL   BUN 18 6 - 23 mg/dL   Creatinine, Ser 0.93 0.40 - 1.50 mg/dL   Calcium 9.1 8.4 - 10.5 mg/dL   GFR 91.54 >60.00 mL/min  TSH  Result Value Ref Range   TSH 1.78 0.35 - 4.50 uIU/mL  Lipid panel  Result Value Ref Range   Cholesterol 184 0 - 200 mg/dL   Triglycerides 184.0 (H) 0.0 - 149.0 mg/dL   HDL 42.10 >39.00 mg/dL   VLDL 36.8 0.0 - 40.0 mg/dL   LDL Cholesterol 105 (H) 0 - 99 mg/dL   Total CHOL/HDL Ratio 4    NonHDL 141.79       Assessment & Plan:   Problem List  Items Addressed This Visit    Adult ADHD    Tolerating adderall well.  UDS reviewed from 03/2017 - appropriate Continue.       Dyslipidemia    Elevated trig - reviewed diet choices to improve readings.       Health maintenance examination - Primary    Preventative protocols reviewed and updated unless pt declined. Discussed healthy diet and lifestyle.       Obesity, morbid, BMI 40.0-49.9 ([place])    Discussed healthy diet and lifestyle changes to affect  sustainable weight loss. He tries to walk 1 mile 3 d/wk at work.        Other Visit Diagnoses    Need for influenza vaccination       Relevant Orders   Flu Vaccine QUAD 6+ mos PF IM (Fluarix  Quad PF) (Completed)   Need for Tdap vaccination       Relevant Orders   Tdap vaccine greater than or equal to 7yo IM (Completed)       Follow up plan: Return in about 1 year (around 07/24/2018) for annual exam, prior fasting for blood work.  Ria Bush, MD

## 2017-07-24 NOTE — Assessment & Plan Note (Signed)
Elevated trig - reviewed diet choices to improve readings.

## 2017-07-24 NOTE — Assessment & Plan Note (Addendum)
Tolerating adderall well.  UDS reviewed from 03/2017 - appropriate Continue.

## 2017-07-24 NOTE — Assessment & Plan Note (Signed)
Preventative protocols reviewed and updated unless pt declined. Discussed healthy diet and lifestyle.  

## 2017-08-01 ENCOUNTER — Other Ambulatory Visit: Payer: Self-pay | Admitting: Family Medicine

## 2017-08-01 NOTE — Telephone Encounter (Signed)
Last printed:  06/19/17, #60 Last OV (CPE):  07/24/17 Next OV:  07/25/18

## 2017-08-04 MED ORDER — AMPHETAMINE-DEXTROAMPHETAMINE 10 MG PO TABS: 10.0000 mg | ORAL_TABLET | Freq: Two times a day (BID) | ORAL | 0 refills | Status: DC

## 2017-08-04 NOTE — Telephone Encounter (Signed)
plz notify this was sent electronically 

## 2017-08-05 NOTE — Telephone Encounter (Signed)
Spoke with pt notifying him rx was sent electronically to the pharmacy.  Says great.

## 2017-09-12 ENCOUNTER — Other Ambulatory Visit: Payer: Self-pay | Admitting: Family Medicine

## 2017-09-12 NOTE — Telephone Encounter (Signed)
Pt requesting refill Adderall Last refilled and qty; # 60 on 08/04/17 Last seen: 07/24/17 annual exam Pharmacy: [facility]

## 2017-09-12 NOTE — Telephone Encounter (Signed)
Copied from [place] [phone]. Topic: Quick Communication - Rx Refill/Question >> Sep 12, 2017 10:38 AM Clack, [name] wrote: Medication:  amphetamine-dextroamphetamine (ADDERALL) 10 MG tablet [[id number]]    Has the patient contacted their pharmacy? No. ([facility] is no longer open)   (Agent: If no, request that the patient contact the pharmacy for the refill.)   Preferred Pharmacy (with phone number or street name): [id number] [place], [place] [phone] (Phone) [phone] (Fax)    Pt is aware that [name] is now close. Please contact pt if/when Rx is approval and called in.   Agent: Please be advised that RX refills may take up to 3 business days. We ask that you follow-up with your pharmacy.

## 2017-09-12 NOTE — Telephone Encounter (Signed)
Adderall 10 mg refill Last OV: 07/24/17 Last Refill:08/04/17 [facility] [phone]

## 2017-09-13 MED ORDER — AMPHETAMINE-DEXTROAMPHETAMINE 10 MG PO TABS: 10.0000 mg | ORAL_TABLET | Freq: Two times a day (BID) | ORAL | 0 refills | Status: DC

## 2017-09-13 NOTE — Telephone Encounter (Signed)
Sent electronically 

## 2017-10-15 ENCOUNTER — Encounter: Payer: Self-pay | Admitting: Family Medicine

## 2017-10-15 MED ORDER — AMPHETAMINE-DEXTROAMPHETAMINE 10 MG PO TABS: 10.0000 mg | ORAL_TABLET | Freq: Two times a day (BID) | ORAL | 0 refills | Status: DC

## 2017-10-15 NOTE — Telephone Encounter (Signed)
Eprescribed.

## 2017-11-18 ENCOUNTER — Ambulatory Visit: Payer: BC Managed Care – PPO | Admitting: Podiatry

## 2017-11-18 ENCOUNTER — Ambulatory Visit (INDEPENDENT_AMBULATORY_CARE_PROVIDER_SITE_OTHER): Payer: BC Managed Care – PPO

## 2017-11-18 DIAGNOSIS — M722 Plantar fascial fibromatosis: Secondary | ICD-10-CM

## 2017-11-18 MED ORDER — MELOXICAM 15 MG PO TABS: 15.0000 mg | ORAL_TABLET | Freq: Every day | ORAL | 0 refills | Status: DC

## 2017-11-18 NOTE — Patient Instructions (Signed)

## 2017-11-19 DIAGNOSIS — M722 Plantar fascial fibromatosis: Secondary | ICD-10-CM | POA: Insufficient documentation

## 2017-11-19 NOTE — Progress Notes (Signed)
Subjective: 52 year old male presents the office today for concerns of right heel pain.  He states he has a history of plantar fasciitis and has orthotics that were made in 2017.  He was doing well until a month ago when he started to have some throbbing recurrence of pain to the bottom of the heel.  He states the pain is worse than when he first gets up or if he continued for some time and stands back up.  He has no recent injury or trauma denies any swelling or redness.  The pain does not wake him up at night.  He has no other concerns today. Denies any systemic complaints such as fevers, chills, nausea, vomiting. No acute changes since last appointment, and no other complaints at this time.   Objective: AAO x3, NAD DP/PT pulses palpable bilaterally, CRT less than 3 seconds Tenderness to palpation along the plantar medial tubercle of the calcaneus at the insertion of plantar fascia on the right foot.  Very minimal discomfort to the left side.  There is no pain along the course of the plantar fascia within the arch of the foot. Plantar fascia appears to be intact. There is no pain with lateral compression of the calcaneus or pain with vibratory sensation. There is no pain along the course or insertion of the achilles tendon. No other areas of tenderness to bilateral lower extremities.  Negative Tinel sign. No open lesions or pre-ulcerative lesions.  No pain with calf compression, swelling, warmth, erythema  Assessment: Right heel pain, plantar fasciitis  Plan: -All treatment options discussed with the patient including all alternatives, risks, complications.  -X-rays were obtained and reviewed with the patient.  On the right side there is inferior calcaneal spurring present there is no evidence of acute fracture or stress fracture.  Did not want to get x-rays of the left foot. -This is a steroid injection he wishes to proceed.  See procedure note below. -Discussed stretching, icing exercises  daily. -Prescribed mobic. Discussed side effects of the medication and directed to stop if any are to occur and call the office.  -We also discussed a custom orthotic for address shoe issues and wishes to proceed with this.  He is molded today by Liliane Channel for this.  We will check insurance coverage before ordering and information was given to [facility].  -RTC 3-4 weeks or sooner if needed -Patient encouraged to call the office with any questions, concerns, change in symptoms.   Trula Slade DPM

## 2017-11-26 ENCOUNTER — Other Ambulatory Visit: Payer: Self-pay | Admitting: Family Medicine

## 2017-11-26 MED ORDER — AMPHETAMINE-DEXTROAMPHETAMINE 10 MG PO TABS: 10.0000 mg | ORAL_TABLET | Freq: Two times a day (BID) | ORAL | 0 refills | Status: DC

## 2017-11-26 NOTE — Telephone Encounter (Signed)
Last refill 10/15/17 #60 Last office visit 07/24/17

## 2017-11-26 NOTE — Telephone Encounter (Signed)
Eprescribed.

## 2017-12-09 ENCOUNTER — Ambulatory Visit: Payer: BC Managed Care – PPO | Admitting: Podiatry

## 2018-01-08 ENCOUNTER — Other Ambulatory Visit: Payer: Self-pay | Admitting: Family Medicine

## 2018-01-08 NOTE — Telephone Encounter (Signed)
Name of [place] 10 mg Name of Pharmacy: [facility] or Written Date and Quantity: #60 on 11/26/17 Last Office Visit and Type: 07/24/17 annual exam Next Office Visit and Type: CPX scheduled on 07/25/18  Last Controlled Substance Agreement Date: 03/21/2017 Last UDS:03/21/17

## 2018-01-09 MED ORDER — AMPHETAMINE-DEXTROAMPHETAMINE 10 MG PO TABS: 10.0000 mg | ORAL_TABLET | Freq: Two times a day (BID) | ORAL | 0 refills | Status: DC

## 2018-01-09 NOTE — Telephone Encounter (Signed)
Eprescribed.

## 2018-02-19 ENCOUNTER — Other Ambulatory Visit: Payer: Self-pay | Admitting: Family Medicine

## 2018-02-19 NOTE — Telephone Encounter (Signed)
Name of Medication: Adderall 10 mg Name of Pharmacy: [facility] or Written Date and Quantity: #60 on 01/09/18 Last Office Visit and Type: 07/24/17 annual Next Office Visit and Type: 07/25/18 CPX Last Controlled Substance Agreement Date: 03/21/17 Last UDS:03/21/17

## 2018-02-20 MED ORDER — AMPHETAMINE-DEXTROAMPHETAMINE 10 MG PO TABS: 10.0000 mg | ORAL_TABLET | Freq: Two times a day (BID) | ORAL | 0 refills | Status: DC

## 2018-02-20 NOTE — Telephone Encounter (Signed)
Eprescribed.

## 2018-03-28 ENCOUNTER — Other Ambulatory Visit: Payer: Self-pay | Admitting: Family Medicine

## 2018-03-28 NOTE — Telephone Encounter (Signed)
Name of [place] 10 mg  Name of Pharmacy: [facility] or Written Date and Quantity: #60 on 02/20/18 Last Office Visit and Type: 07/24/17 annual Next Office Visit and Type: 07/25/18 CPX Last Controlled Substance Agreement Date: 03/21/17 Last UDS:03/21/17

## 2018-03-30 MED ORDER — AMPHETAMINE-DEXTROAMPHETAMINE 10 MG PO TABS: 10.0000 mg | ORAL_TABLET | Freq: Two times a day (BID) | ORAL | 0 refills | Status: DC

## 2018-03-30 NOTE — Telephone Encounter (Signed)
Eprescribed.

## 2018-04-27 ENCOUNTER — Ambulatory Visit (HOSPITAL_COMMUNITY)
Admission: EM | Admit: 2018-04-27 | Discharge: 2018-04-27 | Disposition: A | Discharge status: Home / Self Care | Payer: BC Managed Care – PPO | Attending: Family Medicine | Admitting: Family Medicine

## 2018-04-27 ENCOUNTER — Encounter (HOSPITAL_COMMUNITY): Payer: Self-pay | Admitting: Emergency Medicine

## 2018-04-27 DIAGNOSIS — M545 Low back pain, unspecified: Secondary | ICD-10-CM

## 2018-04-27 MED ORDER — KETOROLAC TROMETHAMINE 60 MG/2ML IM SOLN
INTRAMUSCULAR | Status: AC
  Filled 2018-04-27: qty 2

## 2018-04-27 MED ORDER — CYCLOBENZAPRINE HCL 10 MG PO TABS: 10.0000 mg | ORAL_TABLET | Freq: Two times a day (BID) | ORAL | 0 refills | Status: DC | PRN

## 2018-04-27 MED ORDER — KETOROLAC TROMETHAMINE 60 MG/2ML IM SOLN
60.0000 mg | Freq: Once | INTRAMUSCULAR | Status: AC
  Administered 2018-04-27: 60 mg via INTRAMUSCULAR

## 2018-04-27 MED ORDER — MELOXICAM 15 MG PO TABS: 15.0000 mg | ORAL_TABLET | Freq: Every day | ORAL | 0 refills | Status: DC

## 2018-04-27 NOTE — ED Provider Notes (Signed)
[place]    CSN: [id number] Arrival date & time: 04/27/18  1738     History   Chief Complaint Chief Complaint  Patient presents with  . Back Pain    HPI Nathan Camacho is a 52 y.o. male presenting today for evaluation of right lower back pain.  Patient states that yesterday he was trimming bushes and weed eating.  The of the day his back pain gradually set and.  Denies any specific injury that triggered his back pain.  Since he has had increased difficulty and pain moving and walking.  He has been using a crutch to help him walk.  Denies any changes in his urination or bowel habits.  Denies numbness or tingling, denies saddle anesthesia.  States that the pain radiates into his right groin area, but denies any radiation of numbness/tingling.  Denies shortness of breath or difficulty breathing.  Denies chest pain.  Denies nausea or vomiting.  He is taking 2 Aleve without relief.  States that he has had 2 prior episodes of throwing his back out.  HPI  Past Medical History:  Diagnosis Date  . Adult ADHD 08/24/2014   S/p eval by our psychologist Dr Lurline Hare - consistent with ADHD but mild case currently.    . Allergy     Patient Active Problem List   Diagnosis Date Noted  . Plantar fasciitis 11/19/2017  . Dyslipidemia 07/24/2017  . Health maintenance examination 11/11/2014  . Adult ADHD 08/24/2014  . Obesity, morbid, BMI 40.0-49.9 ([place]) 03/27/2013  . ALLERGIC RHINITIS 12/04/2006    History reviewed. No pertinent surgical history.     Home Medications    Prior to Admission medications   Medication Sig Start Date End Date Taking? Authorizing Provider  amphetamine-dextroamphetamine (ADDERALL) 10 MG tablet Take 1 tablet (10 mg total) by mouth 2 (two) times daily. 03/30/18   Ria Bush, MD  aspirin 81 MG tablet Take 81 mg by mouth daily.      [provider]  cyclobenzaprine (FLEXERIL) 10 MG tablet Take 1 tablet (10 mg total) by mouth 2 (two) times  daily as needed for muscle spasms. 04/27/18   Mahogani Holohan C, PA-C  fexofenadine (ALLEGRA) 180 MG tablet Take 1 tablet (180 mg total) by mouth daily. 07/24/17   Ria Bush, MD  fluticasone [facility]) 50 MCG/ACT nasal spray Place 2 sprays into both nostrils daily. 07/24/17   Ria Bush, MD  meloxicam (MOBIC) 15 MG tablet Take 1 tablet (15 mg total) by mouth daily. With food 04/27/18 04/27/19  Shamon Lobo C, PA-C  Multiple Vitamin (MULTIVITAMIN) tablet Take 1 tablet by mouth daily.      [provider]    Family History Family History  Problem Relation Age of Onset  . Crohn's disease Mother   . Atrial fibrillation Mother   . Hypertension Mother   . COPD Father        smoker  . Cancer Father 80       anal  . Cancer Paternal Grandfather        stomach    Social History Social History   Tobacco Use  . Smoking status: Former Smoker    Types: Cigarettes    Last attempt to quit: 12/17/1993    Years since quitting: 24.3  . Smokeless tobacco: Never Used  Substance Use Topics  . Alcohol use: Yes  . Drug use: No     Allergies   Bee venom   Review of Systems Review of Systems  Constitutional: Negative for activity change, chills, diaphoresis and fatigue.  HENT: Negative for ear pain, tinnitus and trouble swallowing.   Eyes: Negative for photophobia and visual disturbance.  Respiratory: Negative for cough, chest tightness and shortness of breath.   Cardiovascular: Negative for chest pain and leg swelling.  Gastrointestinal: Negative for abdominal pain, blood in stool, nausea and vomiting.  Genitourinary: Negative for decreased urine volume and difficulty urinating.  Musculoskeletal: Positive for back pain, gait problem and myalgias. Negative for arthralgias, neck pain and neck stiffness.  Skin: Negative for color change and wound.  Neurological: Negative for dizziness, weakness, light-headedness, numbness and headaches.     Physical Exam Triage Vital  Signs ED Triage Vitals [04/27/18 1808]  Enc Vitals Group     BP (!) 150/87     Pulse Rate 73     Resp 18     Temp 98.1 F (36.7 C)     Temp Source Oral     SpO2 97 %     Weight      Height      Head Circumference      Peak Flow      Pain Score      Pain Loc      Pain Edu?      Excl. in [place]?    No data found.  Updated Vital Signs BP (!) 150/87 (BP Location: Right Arm)   Pulse 73   Temp 98.1 F (36.7 C) (Oral)   Resp 18   SpO2 97%   Visual Acuity Right Eye Distance:   Left Eye Distance:   Bilateral Distance:    Right Eye Near:   Left Eye Near:    Bilateral Near:     Physical Exam  Constitutional: He appears well-developed and well-nourished.  HENT:  Head: Normocephalic and atraumatic.  Eyes: Conjunctivae are normal.  Neck: Neck supple.  Cardiovascular: Normal rate and regular rhythm.  No murmur heard. Pulmonary/Chest: Effort normal and breath sounds normal. No respiratory distress.  Abdominal: Soft. There is no tenderness.  Musculoskeletal: He exhibits no edema.  Patient is able to ambulate from chair to exam table with using one crutch, has to turn on side in order to transition from sitting to supine.  Nontender to palpation entire length of spine, nontender to palpation of lateral lumbar musculature, not reproducible with palpation.  Pain is reproduced with movement.  Negative straight leg raise bilaterally, increased pain with lowering legs back to the table  Strength 5/5 and equal bilaterally at hips and knees, patellar reflexes 1+ bilaterally  Neurological: He is alert.  Skin: Skin is warm and dry.  Psychiatric: He has a normal mood and affect.  Nursing note and vitals reviewed.    UC Treatments / Results  Labs (all labs ordered are listed, but only abnormal results are displayed) Labs Reviewed - No data to display  EKG None  Radiology No results found.  Procedures Procedures (including critical care time)  Medications Ordered in  UC Medications  ketorolac (TORADOL) injection 60 mg (60 mg Intramuscular Given 04/27/18 1842)    Initial Impression / Assessment and Plan / UC Course  I have reviewed the triage vital signs and the nursing notes.  Pertinent labs & imaging results that were available during my care of the patient were reviewed by me and considered in my medical decision making (see chart for details).     Patient likely with muscular strain of lumbar region of back after increased work/activity.  No neuro deficits,  no red flags for cauda equina.  Recommending anti-inflammatories and muscle relaxers.  Will provide Toradol prior to discharge.  Continue with Tylenol and ibuprofen or may try Mobic.  Flexeril provided.  Discussed sedation regarding Flexeril and advised to only use at home or at bedtime, not to work or drive after taking.  Advised about daily activities, but no excessive lifting, avoid total bedrest.Discussed strict return precautions. Patient verbalized understanding and is agreeable with plan.  Final Clinical Impressions(s) / UC Diagnoses   Final diagnoses:  Acute right-sided low back pain without sciatica     Discharge Instructions     Back pain is most likely from a strain, please go about her normal daily tasks, avoid complete bedrest, but also avoid any heavy lifting or overexertion I expect this pain to gradually improve over the next 1 to 2 weeks  Use anti-inflammatories for pain/swelling. You may take up to 800 mg Ibuprofen every 8 hours OR Mobic 15 mg daily with food. You may supplement Ibuprofen with Tylenol [phone] mg every 8 hours.   You may use flexeril as needed to help with pain. This is a muscle relaxer and causes sedation- please use only at bedtime or when you will be home and not have to drive/work  Alternate ice and heat  Please follow-up if symptoms not improving, worsening, developing weakness in legs, loss of bowel or bladder control, numbness and tingling between  your thighs/groin   ED Prescriptions    Medication Sig Dispense Auth. Provider   cyclobenzaprine (FLEXERIL) 10 MG tablet Take 1 tablet (10 mg total) by mouth 2 (two) times daily as needed for muscle spasms. 20 tablet Malyk Girouard C, PA-C   meloxicam (MOBIC) 15 MG tablet Take 1 tablet (15 mg total) by mouth daily. With food 30 tablet Lynsie Mcwatters, [place] C, PA-C     Controlled Substance Prescriptions [place] Controlled Substance Registry consulted? Not Applicable   Janith Lima, [place] 04/28/18 1031

## 2018-04-27 NOTE — ED Triage Notes (Signed)
Pt sts right lower back pain with radiation to right leg after doing yard work

## 2018-04-27 NOTE — Discharge Instructions (Signed)
Back pain is most likely from a strain, please go about her normal daily tasks, avoid complete bedrest, but also avoid any heavy lifting or overexertion I expect this pain to gradually improve over the next 1 to 2 weeks  Use anti-inflammatories for pain/swelling. You may take up to 800 mg Ibuprofen every 8 hours OR Mobic 15 mg daily with food. You may supplement Ibuprofen with Tylenol [phone] every 8 hours.   You may use flexeril as needed to help with pain. This is a muscle relaxer and causes sedation- please use only at bedtime or when you will be home and not have to drive/work  Alternate ice and heat  Please follow-up if symptoms not improving, worsening, developing weakness in legs, loss of bowel or bladder control, numbness and tingling between your thighs/groin

## 2018-05-16 ENCOUNTER — Other Ambulatory Visit: Payer: Self-pay | Admitting: Family Medicine

## 2018-05-16 NOTE — Telephone Encounter (Signed)
Name of Medication:  amphetamine-dextroamphetamine (ADDERALL) 10 MG tablet    Name of [facility]acy: [facility] Last Fill or Written Date and Quantity:  60 tablet 0 03/30/2018   Last Office Visit and Type: 07/2017 CPE Next Office Visit and Type: 07/25/2018 CPE Last Controlled Substance Agreement Date: Last UDS:03/21/2017

## 2018-05-18 MED ORDER — AMPHETAMINE-DEXTROAMPHETAMINE 10 MG PO TABS: 10.0000 mg | ORAL_TABLET | Freq: Two times a day (BID) | ORAL | 0 refills | Status: DC

## 2018-05-18 NOTE — Telephone Encounter (Signed)
Eprescribed.

## 2018-07-01 ENCOUNTER — Other Ambulatory Visit: Payer: Self-pay | Admitting: *Deleted

## 2018-07-01 ENCOUNTER — Encounter: Payer: Self-pay | Admitting: Family Medicine

## 2018-07-01 ENCOUNTER — Ambulatory Visit (INDEPENDENT_AMBULATORY_CARE_PROVIDER_SITE_OTHER)
Admission: RE | Admit: 2018-07-01 | Discharge: 2018-07-01 | Disposition: A | Discharge status: Home / Self Care | Payer: BC Managed Care – PPO | Source: Ambulatory Visit | Attending: Family Medicine | Admitting: Family Medicine

## 2018-07-01 ENCOUNTER — Ambulatory Visit: Payer: BC Managed Care – PPO | Admitting: Family Medicine

## 2018-07-01 VITALS — BP 148/76 | HR 70 | Temp 98.0°F | Ht 71.5 in | Wt 292.5 lb

## 2018-07-01 DIAGNOSIS — M25562 Pain in left knee: Secondary | ICD-10-CM

## 2018-07-01 MED ORDER — MELOXICAM 15 MG PO TABS: 15.0000 mg | ORAL_TABLET | Freq: Every day | ORAL | 1 refills | Status: DC

## 2018-07-01 MED ORDER — AMPHETAMINE-DEXTROAMPHETAMINE 10 MG PO TABS: 10.0000 mg | ORAL_TABLET | Freq: Two times a day (BID) | ORAL | 0 refills | Status: DC

## 2018-07-01 NOTE — Telephone Encounter (Signed)
Eprescribed.

## 2018-07-01 NOTE — Assessment & Plan Note (Addendum)
Enc wt loss for knee and back problems as well as general health  When improved-regular exercise is a good start  bp is also mildly elevated today

## 2018-07-01 NOTE — Patient Instructions (Signed)
Xray today  Use ice - every chance you get for 10 minutes elevate leg and use a cold compress  Try to avoid activities that hurt  Try meloxicam 15 mg daily with food   Plan to follow

## 2018-07-01 NOTE — Progress Notes (Signed)
Subjective:    Patient ID: Nathan Camacho, male    DOB: Sep 24, 1965, 52 y.o.   MRN: [id number]  HPI  Here for L knee pain /burning sensation   52 yo pt of Dr Harlow Ohms Readings from Last 3 Encounters:  07/01/18 292 lb 8 oz (132.7 kg)  07/24/17 292 lb (132.5 kg)  08/27/16 (!) 300 lb 4 oz (136.2 kg)   40.23 kg/m   Sharp pain in knee (points to medial knee)  No injury or event to cause it  No swelling or tightness  A little numb feeling at times  No warmth  No redness    No current athletics  In the past used to walk trails  No low back problems now  No hx of knee injury or surgery  No hx of arthritis   Noted mowing lawn on Sunday  Then worse-up all night /could not get comfortable  Twisting and side to side movement hurts the worst (not as bad with bending)   Took aleve-not a lot of help   Stairs to bother  Also movement after inactivity  bp is a little elevated today  BP Readings from Last 3 Encounters:  07/01/18 (!) 148/76  04/27/18 (!) 150/87  07/24/17 128/80   Has f/u with pcp next mo   Aware weight loss would help  Patient Active Problem List   Diagnosis Date Noted  . Left knee pain 07/01/2018  . Plantar fasciitis 11/19/2017  . Dyslipidemia 07/24/2017  . Health maintenance examination 11/11/2014  . Adult ADHD 08/24/2014  . Obesity, morbid, BMI 40.0-49.9 ([place]) 03/27/2013  . ALLERGIC RHINITIS 12/04/2006   Past Medical History:  Diagnosis Date  . Adult ADHD 08/24/2014   S/p eval by our psychologist Dr Lurline Hare - consistent with ADHD but mild case currently.    . Allergy    History reviewed. No pertinent surgical history. Social History   Tobacco Use  . Smoking status: Former Smoker    Types: Cigarettes    Last attempt to quit: 12/17/1993    Years since quitting: 24.5  . Smokeless tobacco: Never Used  Substance Use Topics  . Alcohol use: Yes  . Drug use: No   Family History  Problem Relation Age of Onset  . Crohn's disease Mother   .  Atrial fibrillation Mother   . Hypertension Mother   . COPD Father        smoker  . Cancer Father 37       anal  . Cancer Paternal Grandfather        stomach   Allergies  Allergen Reactions  . Bee Venom    Current Outpatient Medications on File Prior to Visit  Medication Sig Dispense Refill  . amphetamine-dextroamphetamine (ADDERALL) 10 MG tablet Take 1 tablet (10 mg total) by mouth 2 (two) times daily. 60 tablet 0  . aspirin 81 MG tablet Take 81 mg by mouth daily.      . fexofenadine (ALLEGRA) 180 MG tablet Take 1 tablet (180 mg total) by mouth daily. 30 tablet 11  . fluticasone (FLONASE) 50 MCG/ACT nasal spray Place 2 sprays into both nostrils daily. 16 g 11  . Multiple Vitamin (MULTIVITAMIN) tablet Take 1 tablet by mouth daily.       No current facility-administered medications on file prior to visit.     Review of Systems  Constitutional: Negative for activity change, appetite change, fatigue, fever and unexpected weight change.  HENT: Negative for sore throat.  Eyes: Negative for pain, redness, itching and visual disturbance.  Respiratory: Negative for cough, chest tightness, shortness of breath and wheezing.   Cardiovascular: Negative for chest pain and palpitations.  Gastrointestinal: Negative for abdominal pain.       Tolerates nsaids   Endocrine: Negative for cold intolerance, heat intolerance, polydipsia and polyuria.  Genitourinary: Negative for frequency.  Musculoskeletal: Negative for arthralgias, joint swelling and myalgias.       L knee pain - medial w/o swelling  Skin: Negative for pallor and rash.  Neurological: Negative for dizziness, tremors, weakness, numbness and headaches.       Occ tingling in L leg/knee area   Hematological: Negative for adenopathy. Does not bruise/bleed easily.  Psychiatric/Behavioral: Negative for decreased concentration and dysphoric mood. The patient is not nervous/anxious.        Objective:   Physical Exam  Constitutional: He  appears well-developed and well-nourished. No distress.  obese and well appearing   HENT:  Head: Normocephalic and atraumatic.  Eyes: Pupils are equal, round, and reactive to light. EOM are normal. No scleral icterus.  Neck: Normal range of motion. Neck supple.  Cardiovascular: Normal rate, regular rhythm and normal heart sounds.  Pulmonary/Chest: Effort normal and breath sounds normal. No respiratory distress. He has no wheezes. He has no rales.  Musculoskeletal: Normal range of motion. He exhibits tenderness. He exhibits no edema or deformity.       Left knee: He exhibits normal range of motion, no swelling, no effusion, no ecchymosis, no deformity, no laceration, no erythema, normal alignment, no LCL laxity, normal patellar mobility and no bony tenderness. Tenderness found. Medial joint line tenderness noted.  L knee Tender over medial joint line and medial edge of patella  No swelling/warmth or redness Nl rom flex/ext (not apprehensive to fully extend) Pos murphy sign for knee pain  Some discomfort with pivot maneuver  Very mild crepitus both knees   Generally tight quads and hamstrings   Neg ant drawer/lachman   (stable)   Lymphadenopathy:    He has no cervical adenopathy.  Neurological: He displays no atrophy. No sensory deficit. Coordination normal.  Skin: Skin is warm and dry. No erythema.  Psychiatric: He has a normal mood and affect.  Pleasant           Assessment & Plan:   Problem List Items Addressed This Visit      Other   Obesity, morbid, BMI 40.0-49.9 ([place])    Enc wt loss for knee and back problems as well as general health  When improved-regular exercise is a good start  bp is also mildly elevated today      Left knee pain - Primary    Acute/ medial with some joint line tenderness in obese male  No known injury and no swelling or effusion apparent on exam  consv tx- ice/elevate/relative rest from positions that hurt and meloxicam 15 mg daily with  food Knee sleeve for compression if this helps when ambulating  Xray today to r/o arthritis or other bony abn  Cannot r/o plica problem or meniscal injury Plan to follow after rad rev      Relevant Orders   DG Knee 4 Views W/Patella Left

## 2018-07-01 NOTE — Telephone Encounter (Signed)
Pt came in for an acute appt with Dr. Glori Bickers and request I send a refill request for his adderall  Name of Medication: amphetamine-dextroamphetamine (ADDERALL) 10 MG tablet Name of [facility] Last Fill or Written Date and Quantity: 05/18/18 #60 tabs with 0 refills  Last Office Visit and Type: Acute appt today with Dr. Glori Bickers Next Office Visit and Type: 07/25/2018 CPE Last Controlled Substance Agreement Date: Last UDS:03/21/2017

## 2018-07-01 NOTE — Assessment & Plan Note (Addendum)
Acute/ medial with some joint line tenderness in obese male  No known injury and no swelling or effusion apparent on exam  consv tx- ice/elevate/relative rest from positions that hurt and meloxicam 15 mg daily with food Knee sleeve for compression if this helps when ambulating  Xray today to r/o arthritis or other bony abn  Cannot r/o plica problem or meniscal injury Plan to follow after rad rev

## 2018-07-09 ENCOUNTER — Ambulatory Visit: Payer: Self-pay | Admitting: Family Medicine

## 2018-07-17 ENCOUNTER — Other Ambulatory Visit: Payer: Self-pay | Admitting: Family Medicine

## 2018-07-17 DIAGNOSIS — Z125 Encounter for screening for malignant neoplasm of prostate: Secondary | ICD-10-CM

## 2018-07-17 DIAGNOSIS — E785 Hyperlipidemia, unspecified: Secondary | ICD-10-CM

## 2018-07-22 ENCOUNTER — Other Ambulatory Visit (INDEPENDENT_AMBULATORY_CARE_PROVIDER_SITE_OTHER): Payer: BC Managed Care – PPO

## 2018-07-22 DIAGNOSIS — Z125 Encounter for screening for malignant neoplasm of prostate: Secondary | ICD-10-CM

## 2018-07-22 DIAGNOSIS — E785 Hyperlipidemia, unspecified: Secondary | ICD-10-CM

## 2018-07-22 LAB — COMPREHENSIVE METABOLIC PANEL
ALK PHOS: 63 U/L (ref 39–117)
ALT: 22 U/L (ref 0–53)
AST: 16 U/L (ref 0–37)
Albumin: 4.2 g/dL (ref 3.5–5.2)
BILIRUBIN TOTAL: 0.6 mg/dL (ref 0.2–1.2)
BUN: 14 mg/dL (ref 6–23)
CALCIUM: 9.1 mg/dL (ref 8.4–10.5)
CO2: 28 mEq/L (ref 19–32)
Chloride: 103 mEq/L (ref 96–112)
Creatinine, Ser: 0.9 mg/dL (ref 0.40–1.50)
GFR: 94.69 mL/min (ref >60.00)
Glucose, Bld: 93 mg/dL (ref 70–99)
Potassium: 4.3 mEq/L (ref 3.5–5.1)
Sodium: 138 mEq/L (ref 135–145)
TOTAL PROTEIN: 6.5 g/dL (ref 6.0–8.3)

## 2018-07-22 LAB — LIPID PANEL
CHOLESTEROL: 194 mg/dL (ref 0–200)
HDL: 37 mg/dL — ABNORMAL LOW (ref >39.00)
NonHDL: 157.27
TRIGLYCERIDES: 326 mg/dL — AB (ref 0.0–149.0)
Total CHOL/HDL Ratio: 5
VLDL: 65.2 mg/dL — ABNORMAL HIGH (ref 0.0–40.0)

## 2018-07-22 LAB — LDL CHOLESTEROL, DIRECT: LDL DIRECT: 109 mg/dL

## 2018-07-22 LAB — PSA: PSA: 0.33 ng/mL (ref 0.10–4.00)

## 2018-07-22 LAB — TSH: TSH: 1.14 u[IU]/mL (ref 0.35–4.50)

## 2018-07-25 ENCOUNTER — Ambulatory Visit (INDEPENDENT_AMBULATORY_CARE_PROVIDER_SITE_OTHER): Payer: BC Managed Care – PPO | Admitting: Family Medicine

## 2018-07-25 ENCOUNTER — Encounter: Payer: Self-pay | Admitting: Family Medicine

## 2018-07-25 VITALS — BP 120/74 | HR 73 | Temp 97.8°F | Ht 71.5 in | Wt 294.5 lb

## 2018-07-25 DIAGNOSIS — Z1211 Encounter for screening for malignant neoplasm of colon: Secondary | ICD-10-CM | POA: Diagnosis not present

## 2018-07-25 DIAGNOSIS — Z0000 Encounter for general adult medical examination without abnormal findings: Secondary | ICD-10-CM

## 2018-07-25 DIAGNOSIS — F909 Attention-deficit hyperactivity disorder, unspecified type: Secondary | ICD-10-CM

## 2018-07-25 DIAGNOSIS — M25562 Pain in left knee: Secondary | ICD-10-CM

## 2018-07-25 DIAGNOSIS — E785 Hyperlipidemia, unspecified: Secondary | ICD-10-CM

## 2018-07-25 NOTE — Assessment & Plan Note (Signed)
Encouraged healthy diet and lifestyle changes to affect sustainable weight loss. He will look into standing desk for work, as well as considering going vegetarian.

## 2018-07-25 NOTE — Patient Instructions (Addendum)
Look into standing desk at work, [employer] changes.  We will refer you to GI for colonoscopy.  Work on diet changes to improve triglycerides. Return as needed orin  1 year for next physical.  Try knee sleeve brace for prolonged periods on your feet, see Dr Lorelei Pont if no better.  Health Maintenance, Male A healthy lifestyle and preventive care is important for your health and wellness. Ask your health care provider about what schedule of regular examinations is right for you. What should I know about weight and diet? Eat a Healthy Diet  Eat plenty of vegetables, fruits, whole grains, low-fat dairy products, and lean protein.  Do not eat a lot of foods high in solid fats, added sugars, or salt.  Maintain a Healthy Weight Regular exercise can help you achieve or maintain a healthy weight. You should:  Do at least 150 minutes of exercise each week. The exercise should increase your heart rate and make you sweat (moderate-intensity exercise).  Do strength-training exercises at least twice a week. Watch Your Levels of Cholesterol and Blood Lipids  Have your blood tested for lipids and cholesterol every 5 years starting at 50 years of age. If you are at high risk for heart disease, you should start having your blood tested when you are 50 years old. You may need to have your cholesterol levels checked more often if: ? Your lipid or cholesterol levels are high. ? You are older than 50 years of age. ? You are at high risk for heart disease. What should I know about cancer screening? Many types of cancers can be detected early and may often be prevented. Lung Cancer  You should be screened every year for lung cancer if: ? You are a current smoker who has smoked for at least 30 years. ? You are a former smoker who has quit within the past 15 years.  Talk to your health care provider about your screening options, when you should start screening, and how often you should be  screened. Colorectal Cancer  Routine colorectal cancer screening usually begins at 50 years of age and should be repeated every 5-10 years until you are 50 years old. You may need to be screened more often if early forms of precancerous polyps or small growths are found. Your health care provider may recommend screening at an earlier age if you have risk factors for colon cancer.  Your health care provider may recommend using home test kits to check for hidden blood in the stool.  A small camera at the end of a tube can be used to examine your colon (sigmoidoscopy or colonoscopy). This checks for the earliest forms of colorectal cancer. Prostate and Testicular Cancer  Depending on your age and overall health, your health care provider may do certain tests to screen for prostate and testicular cancer.  Talk to your health care provider about any symptoms or concerns you have about testicular or prostate cancer. Skin Cancer  Check your skin from head to toe regularly.  Tell your health care provider about any new moles or changes in moles, especially if: ? There is a change in a mole's size, shape, or color. ? You have a mole that is larger than a pencil eraser.  Always use sunscreen. Apply sunscreen liberally and repeat throughout the day.  Protect yourself by wearing long sleeves, pants, a wide-brimmed hat, and sunglasses when outside. What should I know about heart disease, diabetes, and high blood pressure?  If you  are 75-57 years of age, have your blood pressure checked every 3-5 years. If you are 51 years of age or older, have your blood pressure checked every year. You should have your blood pressure measured twice-once when you are at a hospital or clinic, and once when you are not at a hospital or clinic. Record the average of the two measurements. To check your blood pressure when you are not at a hospital or clinic, you can use: ? An automated blood pressure machine at a  pharmacy. ? A home blood pressure monitor.  Talk to your health care provider about your target blood pressure.  If you are between 71-48 years old, ask your health care provider if you should take aspirin to prevent heart disease.  Have regular diabetes screenings by checking your fasting blood sugar level. ? If you are at a normal weight and have a low risk for diabetes, have this test once every three years after the age of 39. ? If you are overweight and have a high risk for diabetes, consider being tested at a younger age or more often.  A one-time screening for abdominal aortic aneurysm (AAA) by ultrasound is recommended for men aged 1-75 years who are current or former smokers. What should I know about preventing infection? Hepatitis B If you have a higher risk for hepatitis B, you should be screened for this virus. Talk with your health care provider to find out if you are at risk for hepatitis B infection. Hepatitis C Blood testing is recommended for:  Everyone born from [birth year] through 1963.  Anyone with known risk factors for hepatitis C. Sexually Transmitted Diseases (STDs)  You should be screened each year for STDs including gonorrhea and chlamydia if: ? You are sexually active and are younger than 50 years of age. ? You are older than 50 years of age and your health care provider tells you that you are at risk for this type of infection. ? Your sexual activity has changed since you were last screened and you are at an increased risk for chlamydia or gonorrhea. Ask your health care provider if you are at risk.  Talk with your health care provider about whether you are at high risk of being infected with HIV. Your health care provider may recommend a prescription medicine to help prevent HIV infection. What else can I do?  Schedule regular health, dental, and eye exams.  Stay current with your vaccines (immunizations).  Do not use any tobacco products, such as cigarettes,  chewing tobacco, and e-cigarettes. If you need help quitting, ask your health care provider.  Limit alcohol intake to no more than 2 drinks per day. One drink equals 12 ounces of beer, 5 ounces of wine, or 1 ounces of hard liquor.  Do not use street drugs.  Do not share needles.  Ask your health care provider for help if you need support or information about quitting drugs.  Tell your health care provider if you often feel depressed.  Tell your health care provider if you have ever been abused or do not feel safe at home. This information is not intended to replace advice given to you by your health care provider. Make sure you discuss any questions you have with your health care provider. Document Released: 01/19/2008 Document Revised: 03/21/2016 Document Reviewed: 04/26/2015 Elsevier Interactive Patient Education  2019 [employer].

## 2018-07-25 NOTE — Assessment & Plan Note (Signed)
Chronic, deteriorated. Off medications. Reviewed diet changes to improve triglyceride levels.  The 10-year ASCVD risk score Mikey Bussing DC Brooke Bonito., et al., 2013) is: 4.1%   Values used to calculate the score:     Age: 52 years     Sex: Male     Is Non-Hispanic African American: No     Diabetic: No     Tobacco smoker: No     Systolic Blood Pressure: 369 mmHg     Is BP treated: No     HDL Cholesterol: 37 mg/dL     Total Cholesterol: 194 mg/dL

## 2018-07-25 NOTE — Progress Notes (Signed)
BP 120/74 (BP Location: Left Arm, Patient Position: Sitting, Cuff Size: Large)   Pulse 73   Temp 97.8 F (36.6 C) (Oral)   Ht 5' 11.5" (1.816 m)   Wt 294 lb 8 oz (133.6 kg)   SpO2 96%   BMI 40.50 kg/m    CC: CPE Subjective:    Patient ID: Nathan Camacho, male    DOB: [birth year], 52 y.o.   MRN: [id number]  HPI: Nathan Camacho is a 52 y.o. male presenting on 07/25/2018 for Annual Exam   ADD dx by psychology. On adderall 10mg  BID (5am and noon-1pm) on week days and tolerating this well. 4-5 hour effect. Weekend holidays. Feels adderall is effective. Some dry mouth, denies insomnia, chest pain, headache, appetite ok. planning holiday break from stimulant.   Ongoing L knee pain - points to anterior/medial knee. Denies inciting trauma or injury. Treating with melixcam.   Preventative: Colon cancer screening - father with h/o anal cancer. Colonoscopy will be recommended at age 57.  Prostate cancer screening - discussed. Would like baseline screening.  Flu shot 06/2018 Td 2008. Tdap 07/2017 Seat belt use discussed Sunscreen use discussed. No changing moles on skin.  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 Interim medical history since our last visit reviewed. Allergies and medications reviewed and updated. Outpatient Medications Prior to Visit  Medication Sig Dispense Refill  . amphetamine-dextroamphetamine (ADDERALL) 10 MG tablet Take 1 tablet (10 mg total) by mouth 2 (two) times daily. 60 tablet 0  . aspirin 81 MG tablet Take 81 mg by  mouth daily.      . Multiple Vitamin (MULTIVITAMIN) tablet Take 1 tablet by mouth daily.      . fexofenadine (ALLEGRA) 180 MG tablet Take 1 tablet (180 mg total) by mouth daily. (Patient not taking: Reported on 07/25/2018) 30 tablet 11  . fluticasone (FLONASE) 50 MCG/ACT nasal spray Place 2 sprays into both nostrils daily. (Patient not taking: Reported on 07/25/2018) 16 g 11  . meloxicam (MOBIC) 15 MG tablet Take 1 tablet (15 mg total) by mouth daily. With a meal (Patient not taking: Reported on 07/25/2018) 30 tablet 1   No facility-administered medications prior to visit.      Per HPI unless specifically indicated in ROS section below Review of Systems  Constitutional: Negative for activity change, appetite change, chills, fatigue, fever and unexpected weight change.  HENT: Positive for congestion (chest congestion). Negative for hearing loss.   Eyes: Negative for visual disturbance.  Respiratory: Positive for cough (residual dry). Negative for chest tightness, shortness of breath and wheezing.   Cardiovascular: Negative for chest pain, palpitations and leg swelling.  Gastrointestinal: Negative for abdominal distention, abdominal pain, blood in stool, constipation, diarrhea, nausea and vomiting.  Genitourinary: Negative for difficulty urinating and hematuria.  Musculoskeletal: Negative for arthralgias, myalgias and neck pain.  Skin: Negative for rash.  Neurological: Negative for dizziness, seizures, syncope and headaches.  Hematological: Negative for adenopathy. Does not bruise/bleed easily.  Psychiatric/Behavioral: Negative for dysphoric mood. The patient is not nervous/anxious.        Objective:  BP 120/74 (BP Location: Left Arm, Patient Position: Sitting, Cuff Size: Large)   Pulse 73   Temp 97.8 F (36.6 C) (Oral)   Ht 5' 11.5" (1.816 m)   Wt 294 lb 8 oz (133.6 kg)   SpO2 96%   BMI 40.50 kg/m   Wt Readings from Last 3 Encounters:  07/25/18 294 lb 8 oz (133.6 kg)  07/01/18  292 lb 8 oz (132.7 kg)  07/24/17 292 lb (132.5 kg)    Physical Exam Vitals signs and nursing note reviewed.  Constitutional:      General: He is not in acute distress.    Appearance: He is well-developed.  HENT:     Head: Normocephalic and atraumatic.     Right Ear: Hearing, tympanic membrane, ear canal and external ear normal.     Left Ear: Hearing, tympanic membrane, ear canal and external ear normal.     Nose: Nose normal.     Mouth/Throat:     Pharynx: Uvula midline. No oropharyngeal exudate or posterior oropharyngeal erythema.  Eyes:     General: No scleral icterus.    Conjunctiva/sclera: Conjunctivae normal.     Pupils: Pupils are equal, round, and reactive to light.  Neck:     Musculoskeletal: Normal range of motion and neck supple.  Cardiovascular:     Rate and Rhythm: Normal rate and regular rhythm.     Pulses:          Radial pulses are 2+ on the right side and 2+ on the left side.     Heart sounds: Normal heart sounds. No murmur.  Pulmonary:     Effort: Pulmonary effort is normal. No respiratory distress.     Breath sounds: Normal breath sounds. No wheezing or rales.  Abdominal:     General: Bowel sounds are normal. There is no distension.     Palpations: Abdomen is soft. There is no mass.     Tenderness: There is no abdominal tenderness. There is no guarding or rebound.  Genitourinary:    Prostate: Normal. Not enlarged (20gm), not tender and no nodules present.     Rectum: Normal. No mass, tenderness, anal fissure, external hemorrhoid or internal hemorrhoid. Normal anal tone.  Musculoskeletal: Normal range of motion.  Lymphadenopathy:     Cervical: No cervical adenopathy.  Skin:    General: Skin is warm and dry.     Findings: No rash.  Neurological:     Mental Status: He is alert and oriented to person, place, and time.     Comments: CN grossly intact, station and gait intact  Psychiatric:        Behavior: Behavior normal.        Thought Content: Thought  content normal.        Judgment: Judgment normal.    Results for orders placed or performed in visit on 07/22/18  PSA  Result Value Ref Range   PSA 0.33 0.10 - 4.00 ng/mL  Lipid panel  Result Value Ref Range   Cholesterol 194 0 - 200 mg/dL   Triglycerides 326.0 (H) 0.0 - 149.0 mg/dL   HDL 37.00 (L) >39.00 mg/dL   VLDL 65.2 (H) 0.0 - 40.0 mg/dL   Total CHOL/HDL Ratio 5    NonHDL 157.27   Comprehensive metabolic panel  Result Value Ref Range   Sodium 138 135 - 145 mEq/L   Potassium 4.3 3.5 - 5.1 mEq/L   Chloride 103 96 - 112 mEq/L   CO2 28 19 - 32 mEq/L  Glucose, Bld 93 70 - 99 mg/dL   BUN 14 6 - 23 mg/dL   Creatinine, Ser 0.90 0.40 - 1.50 mg/dL   Total Bilirubin 0.6 0.2 - 1.2 mg/dL   Alkaline Phosphatase 63 39 - 117 U/L   AST 16 0 - 37 U/L   ALT 22 0 - 53 U/L   Total Protein 6.5 6.0 - 8.3 g/dL   Albumin 4.2 3.5 - 5.2 g/dL   Calcium 9.1 8.4 - 10.5 mg/dL   GFR 94.69 >60.00 mL/min  TSH  Result Value Ref Range   TSH 1.14 0.35 - 4.50 uIU/mL  LDL cholesterol, direct  Result Value Ref Range   Direct LDL 109.0 mg/dL      Assessment & Plan:   Problem List Items Addressed This Visit    Obesity, morbid, BMI 40.0-49.9 (HCC)    Encouraged healthy diet and lifestyle changes to affect sustainable weight loss. He will look into standing desk for work, as well as considering going vegetarian.       Left knee pain    Encouraged knee brace use and f/u with sports med if not better.       Health maintenance examination - Primary    Preventative protocols reviewed and updated unless pt declined. Discussed healthy diet and lifestyle.       Dyslipidemia    Chronic, deteriorated. Off medications. Reviewed diet changes to improve triglyceride levels.  The 10-year ASCVD risk score Mikey Bussing DC Brooke Bonito., et al., 2013) is: 4.1%   Values used to calculate the score:     Age: 79 years     Sex: Male     Is Non-Hispanic African American: No     Diabetic: No     Tobacco smoker: No      Systolic Blood Pressure: 330 mmHg     Is BP treated: No     HDL Cholesterol: 37 mg/dL     Total Cholesterol: 194 mg/dL       Adult ADHD    Chronic, stable. Continue adderall 10mg  bid.  Planning on stimulant holiday over next 2 wks.        Other Visit Diagnoses    Special screening for malignant neoplasms, colon       Relevant Orders   Ambulatory referral to Gastroenterology       No orders of the defined types were placed in this encounter.  Orders Placed This Encounter  Procedures  . Ambulatory referral to Gastroenterology    Referral Priority:   Routine    Referral Type:   Consultation    Referral Reason:   Specialty Services Required    Number of Visits Requested:   1    Follow up plan: Return in about 1 year (around 07/26/2019) for annual exam, prior fasting for blood work.  Ria Bush, MD

## 2018-07-25 NOTE — Assessment & Plan Note (Signed)
Chronic, stable. Continue adderall 10mg  bid.  Planning on stimulant holiday over next 2 wks.

## 2018-07-25 NOTE — Assessment & Plan Note (Signed)
Preventative protocols reviewed and updated unless pt declined. Discussed healthy diet and lifestyle.  

## 2018-07-25 NOTE — Assessment & Plan Note (Signed)
Encouraged knee brace use and f/u with sports med if not better.

## 2018-08-19 ENCOUNTER — Other Ambulatory Visit: Payer: Self-pay | Admitting: Family Medicine

## 2018-08-19 NOTE — Telephone Encounter (Signed)
Name of Medication: Adderall Name of Pharmacy: [facility] Last Fill or Written Date and Quantity: 07/01/18, #60/0 Last Office Visit and Type: 07/25/18, CPE Next Office Visit and Type: 07/27/19, CPE Last Controlled Substance Agreement Date: 03/21/17 Last UDS: 03/21/17

## 2018-08-20 MED ORDER — AMPHETAMINE-DEXTROAMPHET ER 20 MG PO CP24: 20.0000 mg | ORAL_CAPSULE | ORAL | 0 refills | Status: DC

## 2018-08-20 NOTE — Telephone Encounter (Signed)
Will try 1 month extended release adderall per pt request.

## 2018-08-31 NOTE — Progress Notes (Signed)
Dr. Frederico Hamman T. Ibraham Levi, MD, [facility] [place], [address] Phone: [phone] Fax: [phone]  09/01/2018  Patient: Nathan Camacho, MRN: [id number], DOB: August 04, 1966, 53 y.o.  Primary Physician:  Ria Bush, MD   Chief Complaint  Patient presents with  . Knee Pain    Left  . Cough    Dry-Since flu in December   Subjective:   Nathan Camacho is a 53 y.o. very pleasant male patient who presents with the following:  53 year old gentleman with several months history of L sided knee pain referred by Dr. Glori Bickers.   November 26, he ended up have the flu.   Body mass index is 40.81 kg/m.  Points to the medial joint line and in the patellofemoral joint. Taking some Mobic.  Bending the knee now hurts a lot, getting into the car. No mechanical clicks.  No locking up of the joint.   No known acute injury.  Worse going up and down stairs, rising from a seated position - mostly medially.  No fx or surgeries.  Taking some meloxicam. Alleve now -  Icing some off and on. Bracing.  This has helped some.   Had some illness in December. Quit smoking many years ago. 25 year ago quit.  He has had some persistent coughing  L knee inj  Past Medical History, Surgical History, Social History, Family History, Problem List, Medications, and Allergies have been reviewed and updated if relevant.  Patient Active Problem List   Diagnosis Date Noted  . Left knee pain 07/01/2018  . Plantar fasciitis 11/19/2017  . Dyslipidemia 07/24/2017  . Health maintenance examination 11/11/2014  . Adult ADHD 08/24/2014  . Obesity, morbid, BMI 40.0-49.9 ([place]) 03/27/2013  . ALLERGIC RHINITIS 12/04/2006    Past Medical History:  Diagnosis Date  . Adult ADHD 08/24/2014   S/p eval by our psychologist Dr Lurline Hare - consistent with ADHD but mild case currently.    . Allergy     History reviewed. No pertinent surgical history.  Social  History   Socioeconomic History  . Marital status: Married    Spouse name: Not on file  . Number of children: Not on file  . Years of education: Not on file  . Highest education level: Not on file  Occupational History  . Not on file  Social Needs  . Financial resource strain: Not on file  . Food insecurity:    Worry: Not on file    Inability: Not on file  . Transportation needs:    Medical: Not on file    Non-medical: Not on file  Tobacco Use  . Smoking status: Former Smoker    Types: Cigarettes    Last attempt to quit: 12/17/1993    Years since quitting: 24.7  . Smokeless tobacco: Never Used  Substance and Sexual Activity  . Alcohol use: Yes  . Drug use: No  . Sexual activity: Not on file  Lifestyle  . Physical activity:    Days per week: Not on file    Minutes per session: Not on file  . Stress: Not on file  Relationships  . Social connections:    Talks on phone: Not on file    Gets together: Not on file    Attends religious service: Not on file    Active member of club or organization: Not on file    Attends meetings of clubs or organizations: Not on file  Relationship status: Not on file  . Intimate partner violence:    Fear of current or ex partner: Not on file    Emotionally abused: Not on file    Physically abused: Not on file    Forced sexual activity: Not on file  Other Topics Concern  . 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 Crohn's disease Mother   . Atrial fibrillation Mother   . Hypertension Mother   . COPD Father        smoker  . Cancer Father 29       anal  . Cancer Paternal Grandfather        stomach    Allergies  Allergen Reactions  . Bee Venom     Medication list reviewed and updated in full in  [place].  GEN: No fevers, chills. Nontoxic. Primarily MSK c/o today. MSK: Detailed in the HPI GI: tolerating PO intake without difficulty Neuro: No numbness, parasthesias, or tingling associated. Otherwise the pertinent positives of the ROS are noted above.   Objective:   BP 120/72   Pulse 82   Temp 98.2 F (36.8 C) (Oral)   Ht 5' 11.5" (1.816 m)   Wt 296 lb 12 oz (134.6 kg)   BMI 40.81 kg/m    GEN: WDWN, NAD, Non-toxic, Alert & Oriented x 3 HEENT: Atraumatic, Normocephalic.  Ears and Nose: No external deformity. PULM: Normal respiratory rate, no accessory muscle use. No wheezes, crackles or rhonchi  EXTR: No clubbing/cyanosis/edema NEURO: Normal gait.  PSYCH: Normally interactive. Conversant. Not depressed or anxious appearing.  Calm demeanor.    Knee:  L Gait: Normal heel toe pattern ROM: 0-115 Effusion: neg Echymosis or edema: none Patellar tendon NT Painful PLICA: neg Patellar grind: negative Medial and lateral patellar facet loading: negative medial and lateral joint lines:NOTABLE MEDIAL JOINT LINE TENDERNESS Mcmurray's POS Flexion-pinch POS BOUNCE HOME IS POS Varus and valgus stress: stable Lachman: neg Ant and Post drawer: neg Hip abduction, IR, ER: WNL Hip flexion str: 5/5 Hip abd: 5/5 Quad: 5/5 VMO atrophy:No Hamstring concentric and eccentric: 5/5   Radiology: Dg Knee 4 Views W/patella Left  Result Date: 07/02/2018 CLINICAL DATA:  Atraumatic acute medial left knee pain. EXAM: LEFT KNEE - COMPLETE 4+ VIEW COMPARISON:  None. FINDINGS: No evidence of fracture, dislocation, or joint effusion. No evidence of arthropathy or other focal bone abnormality. Soft tissues are unremarkable. IMPRESSION: Negative. Electronically Signed   By: Monte Fantasia M.D.   On: 07/02/2018 08:41   Independently reviewed: X-rays: AP Bilateral Weight-bearing, Lutricia Feil, Weightbearing Lateral, Sunrise views Indication: knee pain Findings:  There is minimal to mild  medial compartmental joint space narrowing without osteophyte formation or subchondral sclerosis.  No fractures or dislocation.  Electronically Signed  By: Owens Loffler, MD On: 09/01/2018 11:09 AM   Assessment and Plan:   Internal derangement of knee joint, left  Acute pain of left knee  Primary osteoarthritis of left knee - Plan: methylPREDNISolone acetate (DEPO-MEDROL) injection 80 mg  Obesity, morbid, BMI 40.0-49.9 ([facility])  Strong suspicion degenerative medial meniscal tear.   Continue conservative care: Alleve, bracing, Ice, ROM, Str. Injection today.  I gave him a meniscal tear rehab protocol with close f/u.  Knee Injection, L  Date of procedure: 09/01/2018 Patient verbally consented to procedure. Risks (including potential rare risk of infection), benefits, and alternatives explained. Sterilely prepped with Chloraprep. Ethyl cholride used for anesthesia. 8 cc Lidocaine 1% mixed with 2 mL Depo-Medrol 40 mg injected using the anteromedial approach without difficulty. No complications with procedure and tolerated well. Patient had decreased pain post-injection. Medication: 2 mL of Depo-Medrol 40 mg, equaling Depo-Medrol 80 mg total   Follow-up: Return in about 2 months (around 10/31/2018).  Meds ordered this encounter  Medications  . methylPREDNISolone acetate (DEPO-MEDROL) injection 80 mg   Signed,  Angelmarie Ponzo T. Jaysiah Marchetta, MD   Outpatient Encounter Medications as of 09/01/2018  Medication Sig  . amphetamine-dextroamphetamine (ADDERALL XR) 20 MG 24 hr capsule Take 1 capsule (20 mg total) by mouth every morning.  Marland Kitchen aspirin 81 MG tablet Take 81 mg by mouth daily.    . fexofenadine (ALLEGRA) 180 MG tablet Take 1 tablet (180 mg total) by mouth daily.  . fluticasone (FLONASE) 50 MCG/ACT nasal spray Place 2 sprays into both nostrils daily.  . meloxicam (MOBIC) 15 MG tablet Take 1 tablet (15 mg total) by mouth daily. With a meal  . Multiple Vitamin (MULTIVITAMIN) tablet Take 1 tablet by  mouth daily.    . [EXPIRED] methylPREDNISolone acetate (DEPO-MEDROL) injection 80 mg    No facility-administered encounter medications on file as of 09/01/2018.

## 2018-09-01 ENCOUNTER — Encounter: Payer: Self-pay | Admitting: Family Medicine

## 2018-09-01 ENCOUNTER — Ambulatory Visit: Payer: BC Managed Care – PPO | Admitting: Family Medicine

## 2018-09-01 VITALS — BP 120/72 | HR 82 | Temp 98.2°F | Ht 71.5 in | Wt 296.8 lb

## 2018-09-01 DIAGNOSIS — M25562 Pain in left knee: Secondary | ICD-10-CM | POA: Diagnosis not present

## 2018-09-01 DIAGNOSIS — M1712 Unilateral primary osteoarthritis, left knee: Secondary | ICD-10-CM | POA: Diagnosis not present

## 2018-09-01 DIAGNOSIS — M2392 Unspecified internal derangement of left knee: Secondary | ICD-10-CM | POA: Diagnosis not present

## 2018-09-01 MED ORDER — METHYLPREDNISOLONE ACETATE 40 MG/ML IJ SUSP
80.0000 mg | Freq: Once | INTRAMUSCULAR | Status: AC
  Administered 2018-09-01: 80 mg via INTRA_ARTICULAR

## 2018-09-02 ENCOUNTER — Encounter: Payer: Self-pay | Admitting: Family Medicine

## 2018-09-08 ENCOUNTER — Ambulatory Visit: Payer: Self-pay | Admitting: Family Medicine

## 2018-09-23 ENCOUNTER — Other Ambulatory Visit: Payer: Self-pay | Admitting: Family Medicine

## 2018-09-23 MED ORDER — AMPHETAMINE-DEXTROAMPHET ER 20 MG PO CP24: 20.0000 mg | ORAL_CAPSULE | ORAL | 0 refills | Status: DC

## 2018-09-23 NOTE — Telephone Encounter (Signed)
Will you address in Dr. Synthia Innocent absence?  Name of Medication: Adderall XR Name of Pharmacy: [facility] Last Fill or Written Date and Quantity: 08/20/18, #30 Last Office Visit and Type: 09/01/18, acute Next Office Visit and Type: 07/27/19, CPE Last Controlled Substance Agreement Date: 03/21/17 Last UDS: 03/21/17

## 2018-10-14 ENCOUNTER — Encounter: Payer: Self-pay | Admitting: Family Medicine

## 2018-10-21 ENCOUNTER — Other Ambulatory Visit: Payer: Self-pay | Admitting: Orthopedic Surgery

## 2018-10-21 DIAGNOSIS — R531 Weakness: Secondary | ICD-10-CM

## 2018-10-21 DIAGNOSIS — R29898 Other symptoms and signs involving the musculoskeletal system: Secondary | ICD-10-CM

## 2018-10-21 DIAGNOSIS — R52 Pain, unspecified: Secondary | ICD-10-CM

## 2018-10-23 ENCOUNTER — Ambulatory Visit
Admission: RE | Admit: 2018-10-23 | Discharge: 2018-10-23 | Disposition: A | Discharge status: Home / Self Care | Payer: BC Managed Care – PPO | Source: Ambulatory Visit | Attending: Orthopedic Surgery | Admitting: Orthopedic Surgery

## 2018-10-23 ENCOUNTER — Other Ambulatory Visit: Payer: Self-pay

## 2018-10-23 DIAGNOSIS — R52 Pain, unspecified: Secondary | ICD-10-CM

## 2018-10-23 DIAGNOSIS — R29898 Other symptoms and signs involving the musculoskeletal system: Secondary | ICD-10-CM

## 2018-10-23 DIAGNOSIS — R531 Weakness: Secondary | ICD-10-CM

## 2018-10-29 ENCOUNTER — Other Ambulatory Visit: Payer: Self-pay

## 2018-10-29 NOTE — Telephone Encounter (Signed)
Name of Medication: [place] Name of Pharmacy: [facility] Last Fill or Written Date and Quantity: 09/23/18, #30 Last Office Visit and Type: 09/01/18, acute w/ Dr. Lorelei Pont Next Office Visit and Type: 07/27/19, CPE Last Controlled Substance Agreement Date: 03/21/17 Last UDS: 03/21/17

## 2018-10-30 MED ORDER — AMPHETAMINE-DEXTROAMPHET ER 20 MG PO CP24: 20.0000 mg | ORAL_CAPSULE | ORAL | 0 refills | Status: DC

## 2018-10-30 NOTE — Telephone Encounter (Signed)
Eprescribed.

## 2018-12-10 ENCOUNTER — Other Ambulatory Visit: Payer: Self-pay | Admitting: Family Medicine

## 2018-12-10 NOTE — Telephone Encounter (Signed)
Name of Medication:Adderall XR 20 mg  Name of Pharmacy: [facility] or Written Date and Quantity:# 30 on 10/30/18  Last Office Visit and Type: 07/25/18 annual Next Office Visit and Type: 07/27/19 CPX Last Controlled Substance Agreement Date: 03/21/17 Last UDS:03/21/17

## 2018-12-11 MED ORDER — AMPHETAMINE-DEXTROAMPHET ER 20 MG PO CP24: 20.0000 mg | ORAL_CAPSULE | ORAL | 0 refills | Status: DC

## 2018-12-11 NOTE — Telephone Encounter (Signed)
Eprescribed.

## 2018-12-24 ENCOUNTER — Other Ambulatory Visit: Payer: Self-pay

## 2018-12-24 ENCOUNTER — Encounter: Payer: Self-pay | Admitting: Family Medicine

## 2018-12-24 ENCOUNTER — Ambulatory Visit: Payer: BC Managed Care – PPO | Admitting: Family Medicine

## 2018-12-24 VITALS — BP 130/82 | HR 76 | Temp 98.3°F | Ht 71.5 in | Wt 293.4 lb

## 2018-12-24 DIAGNOSIS — R1902 Left upper quadrant abdominal swelling, mass and lump: Secondary | ICD-10-CM | POA: Diagnosis not present

## 2018-12-24 DIAGNOSIS — D171 Benign lipomatous neoplasm of skin and subcutaneous tissue of trunk: Secondary | ICD-10-CM | POA: Insufficient documentation

## 2018-12-24 DIAGNOSIS — R1030 Lower abdominal pain, unspecified: Secondary | ICD-10-CM | POA: Insufficient documentation

## 2018-12-24 DIAGNOSIS — R1900 Intra-abdominal and pelvic swelling, mass and lump, unspecified site: Secondary | ICD-10-CM | POA: Insufficient documentation

## 2018-12-24 LAB — POC URINALSYSI DIPSTICK (AUTOMATED)
Bilirubin, UA: NEGATIVE
Blood, UA: NEGATIVE
Glucose, UA: NEGATIVE
Ketones, UA: NEGATIVE
Leukocytes, UA: NEGATIVE
Nitrite, UA: NEGATIVE
Protein, UA: NEGATIVE
Spec Grav, UA: >=1.03 — AB (ref 1.010–1.025)
Urobilinogen, UA: 0.2 E.U./dL
pH, UA: 6 (ref 5.0–8.0)

## 2018-12-24 NOTE — Patient Instructions (Addendum)
Urine looked ok today, but very concentrated - increase water intake. I think lump may be either cyst or benign lipoma (fatty tumor) of the skin. We can watch this, let me know if enlarging or becoming painful.  For lower abdominal discomfort, let's watch for now, increase water, let me know if recurrent or associated with fever or bowel changes. Change diet to clear liquid if it happens again.

## 2018-12-24 NOTE — Progress Notes (Signed)
This visit was conducted in person.  BP 130/82 (BP Location: Left Arm, Patient Position: Sitting, Cuff Size: Large)   Pulse 76   Temp 98.3 F (36.8 C) (Oral)   Ht 5' 11.5" (1.816 m)   Wt 293 lb 6 oz (133.1 kg)   SpO2 98%   BMI 40.35 kg/m    CC: check knot on abdomen, abd pain Subjective:    Patient ID: Nathan Camacho, male    DOB: [birth year], 53 y.o.   MRN: [id number]  HPI: Nathan Camacho is a 53 y.o. male presenting on 12/24/2018 for Cyst (C/o knot on LUQ of abd. Noticed about 1 mo ago. Denies any pain. ) and Abdominal Pain (C/o off and on low abd pain. Started a couple of weeks ago. )   1 mo ago noticed lump/knot on LUQ region, not changing or painful. No skin changes.   Endorses intermittent episodes of lower abd suprapubic discomfort - always happens when he goes to the beach - does drink more almond milk at the beach. Discomfort when he urinates. Latest episode happened 2 wks ago, better when laying still supine, worse with activity such as walking.   Self treated with cranberry with benefit.  No current pain. When episodes occur they last 2-3 days.  No fevers/chills, nausea/vomiting, diarrhea or constipation.  Normal bowel movements.   Wonders of favoring L knee (torn meniscus) may have caused groin strain/MSK strain.      Relevant past medical, surgical, family and social history reviewed and updated as indicated. Interim medical history since our last visit reviewed. Allergies and medications reviewed and updated. Outpatient Medications Prior to Visit  Medication Sig Dispense Refill  . amphetamine-dextroamphetamine (ADDERALL XR) 20 MG 24 hr capsule Take 1 capsule (20 mg total) by mouth every morning. 30 capsule 0  . aspirin 81 MG tablet Take 81 mg by mouth daily.      . fexofenadine (ALLEGRA) 180 MG tablet Take 1 tablet (180 mg total) by mouth daily. 30 tablet 11  . fluticasone (FLONASE) 50 MCG/ACT nasal spray Place 2 sprays into both nostrils daily. 16 g 11  .  Multiple Vitamin (MULTIVITAMIN) tablet Take 1 tablet by mouth daily.      . meloxicam (MOBIC) 15 MG tablet Take 1 tablet (15 mg total) by mouth daily. With a meal 30 tablet 1   No facility-administered medications prior to visit.      Per HPI unless specifically indicated in ROS section below Review of Systems Objective:    BP 130/82 (BP Location: Left Arm, Patient Position: Sitting, Cuff Size: Large)   Pulse 76   Temp 98.3 F (36.8 C) (Oral)   Ht 5' 11.5" (1.816 m)   Wt 293 lb 6 oz (133.1 kg)   SpO2 98%   BMI 40.35 kg/m   Wt Readings from Last 3 Encounters:  12/24/18 293 lb 6 oz (133.1 kg)  09/01/18 296 lb 12 oz (134.6 kg)  07/25/18 294 lb 8 oz (133.6 kg)    Physical Exam Vitals signs and nursing note reviewed.  Constitutional:      General: He is not in acute distress.    Appearance: Normal appearance. He is not ill-appearing.  Abdominal:     General: Abdomen is flat. Bowel sounds are normal. There is no distension.     Palpations: Abdomen is soft. There is no mass.     Tenderness: There is abdominal tenderness (mild) in the left lower quadrant. There is no right CVA tenderness, left  CVA tenderness, guarding or rebound. Negative signs include Murphy's sign.     Hernia: No hernia is present.     Comments: Small well circumscribed mobile lump palpated LUQ below ribcage, more noticeable when upright.  Musculoskeletal: Normal range of motion.     Comments: No pain with resisted flexion of thigh or abduction/adduction of thigh  Neurological:     Mental Status: He is alert.  Psychiatric:        Mood and Affect: Mood normal.       Results for orders placed or performed in visit on 12/24/18  POCT Urinalysis Dipstick (Automated)  Result Value Ref Range   Color, UA yellow    Clarity, UA clear    Glucose, UA Negative Negative   Bilirubin, UA negative    Ketones, UA negative    Spec Grav, UA >=1.030 (A) 1.010 - 1.025   Blood, UA negative    pH, UA 6.0 5.0 - 8.0   Protein,  UA Negative Negative   Urobilinogen, UA 0.2 0.2 or 1.0 E.U./dL   Nitrite, UA negative    Leukocytes, UA Negative Negative   Assessment & Plan:   Problem List Items Addressed This Visit    Lump in the abdomen    Superficial lump noted at LUQ anticipate at skin layer. Anticipate lipoma or benign skin cyst - will watch for now, let me know if enlarging or becoming painful or changing. Pt agrees with plan.       Lower abdominal pain - Primary    Suprapubic to L lower abd discomfort, no significant pain currently. ?diverticulitis vs GI upset from increased fiber. UA today without signs of infection today.  Discussed increased water, clear liquid diet if recurs and if persistent lower abd pain past 2-3 days he will return for in office evaluation at that time.       Relevant Orders   POCT Urinalysis Dipstick (Automated) (Completed)       No orders of the defined types were placed in this encounter.  Orders Placed This Encounter  Procedures  . POCT Urinalysis Dipstick (Automated)    Follow up plan: Return if symptoms worsen or fail to improve.  Ria Bush, MD

## 2018-12-24 NOTE — Assessment & Plan Note (Signed)
Suprapubic to L lower abd discomfort, no significant pain currently. ?diverticulitis vs GI upset from increased fiber. UA today without signs of infection today.  Discussed increased water, clear liquid diet if recurs and if persistent lower abd pain past 2-3 days he will return for in office evaluation at that time.

## 2018-12-24 NOTE — Assessment & Plan Note (Signed)
Superficial lump noted at LUQ anticipate at skin layer. Anticipate lipoma or benign skin cyst - will watch for now, let me know if enlarging or becoming painful or changing. Pt agrees with plan.

## 2019-01-05 HISTORY — PX: MENISCUS REPAIR: SHX5179

## 2019-01-28 ENCOUNTER — Other Ambulatory Visit: Payer: Self-pay | Admitting: Family Medicine

## 2019-01-28 NOTE — Telephone Encounter (Signed)
Name of Medication:adderall XR 20 mg  Name of Pharmacy: [facility] or Written Date and Quantity:# 30 on 12/11/18  Last Office Visit and Type: acute on 12/24/18 & annual on 07/25/18 Next Office Visit and Type: 07/27/19 CPX Last Controlled Substance Agreement Date: 03/21/17 Last UDS:03/21/17

## 2019-01-29 MED ORDER — AMPHETAMINE-DEXTROAMPHET ER 20 MG PO CP24: 20.0000 mg | ORAL_CAPSULE | ORAL | 0 refills | Status: DC

## 2019-01-29 NOTE — Telephone Encounter (Signed)
Eprescribed.

## 2019-03-30 ENCOUNTER — Other Ambulatory Visit: Payer: Self-pay | Admitting: Family Medicine

## 2019-03-31 MED ORDER — AMPHETAMINE-DEXTROAMPHET ER 20 MG PO CP24: 20.0000 mg | ORAL_CAPSULE | ORAL | 0 refills | Status: DC

## 2019-03-31 NOTE — Telephone Encounter (Signed)
Eprescribed.

## 2019-03-31 NOTE — Telephone Encounter (Signed)
LOV 12/24/2018 for acute visit, future appointment on 07/27/2019.last filled on 01/29/2019

## 2019-04-03 ENCOUNTER — Other Ambulatory Visit: Payer: Self-pay | Admitting: Family Medicine

## 2019-04-03 ENCOUNTER — Encounter: Payer: Self-pay | Admitting: Gastroenterology

## 2019-04-04 NOTE — Telephone Encounter (Signed)
plz fill out PA for patient.

## 2019-04-08 NOTE — Telephone Encounter (Signed)
Received faxed PA request for Adderall XR 20 mg cap, key:  [id number], PA case ID:  [id number], Rx #:  [id number].  Decision pending.

## 2019-04-09 NOTE — Telephone Encounter (Signed)
Pt called in to check on his medication.   CB [phone]

## 2019-04-10 NOTE — Telephone Encounter (Signed)
Received faxed PA approval.  Spoke with pharmacy, pt has picked up.

## 2019-04-24 ENCOUNTER — Other Ambulatory Visit: Payer: Self-pay

## 2019-04-24 ENCOUNTER — Encounter: Payer: Self-pay | Admitting: Gastroenterology

## 2019-04-24 ENCOUNTER — Ambulatory Visit (AMBULATORY_SURGERY_CENTER): Payer: BC Managed Care – PPO | Admitting: *Deleted

## 2019-04-24 VITALS — Temp 96.6°F | Ht 71.0 in | Wt 295.0 lb

## 2019-04-24 DIAGNOSIS — Z800 Family history of malignant neoplasm of digestive organs: Secondary | ICD-10-CM

## 2019-04-24 DIAGNOSIS — Z1211 Encounter for screening for malignant neoplasm of colon: Secondary | ICD-10-CM

## 2019-04-24 MED ORDER — SUPREP BOWEL PREP KIT 17.5-3.13-1.6 GM/177ML PO SOLN: 1.0000 | Freq: Once | ORAL | 0 refills | Status: AC

## 2019-04-24 NOTE — Progress Notes (Signed)
No egg or soy allergy known to patient  No issues with past sedation with any surgeries  or procedures, no intubation problems  No diet pills per patient No home 02 use per patient  No blood thinners per patient  Pt denies issues with constipation  No A fib or A flutter  EMMI video sent to pt's e mail    Due to the COVID-19 pandemic we are asking patients to follow these guidelines. Please only bring one care partner. Please be aware that your care partner may wait in the car in the parking lot or if they feel like they will be too hot to wait in the car, they may wait in the lobby on the 4th floor. All care partners are required to wear a mask the entire time (we do not have any that we can provide them), they need to practice social distancing, and we will do a Covid check for all patient's and care partners when you arrive. Also we will check their temperature and your temperature. If the care partner waits in their car they need to stay in the parking lot the entire time and we will call them on their cell phone when the patient is ready for discharge so they can bring the car to the front of the building. Also all patient's will need to wear a mask into building.  Suprep $15 coupon to pt

## 2019-05-07 HISTORY — PX: COLONOSCOPY: SHX174

## 2019-05-08 ENCOUNTER — Encounter: Payer: BC Managed Care – PPO | Admitting: Gastroenterology

## 2019-05-25 ENCOUNTER — Other Ambulatory Visit: Payer: Self-pay | Admitting: Family Medicine

## 2019-05-25 ENCOUNTER — Encounter: Payer: Self-pay | Admitting: Family Medicine

## 2019-05-26 MED ORDER — AMPHETAMINE-DEXTROAMPHET ER 20 MG PO CP24: 20.0000 mg | ORAL_CAPSULE | ORAL | 0 refills | Status: DC

## 2019-05-26 NOTE — Telephone Encounter (Signed)
Name of Medication: Adderall XR Name of Pharmacy: [facility] Last Fill or Written Date and Quantity: 03/31/19, #30 Last Office Visit and Type: 12/24/18, acute abd pain Next Office Visit and Type: 07/27/19, CPE Last Controlled Substance Agreement Date: 03/21/17 Last UDS: 03/21/17

## 2019-05-26 NOTE — Telephone Encounter (Signed)
ERx 

## 2019-05-28 ENCOUNTER — Other Ambulatory Visit: Payer: Self-pay

## 2019-05-28 DIAGNOSIS — Z1159 Encounter for screening for other viral diseases: Secondary | ICD-10-CM

## 2019-06-02 LAB — SARS CORONAVIRUS 2 (TAT 6-24 HRS): SARS Coronavirus 2: NEGATIVE

## 2019-06-03 ENCOUNTER — Encounter: Payer: Self-pay | Admitting: Gastroenterology

## 2019-06-03 ENCOUNTER — Other Ambulatory Visit: Payer: Self-pay

## 2019-06-03 ENCOUNTER — Ambulatory Visit (AMBULATORY_SURGERY_CENTER): Payer: BC Managed Care – PPO | Admitting: Gastroenterology

## 2019-06-03 ENCOUNTER — Other Ambulatory Visit: Payer: Self-pay | Admitting: Gastroenterology

## 2019-06-03 VITALS — BP 118/52 | HR 67 | Temp 98.4°F | Resp 19 | Ht 71.0 in | Wt 295.0 lb

## 2019-06-03 DIAGNOSIS — Z800 Family history of malignant neoplasm of digestive organs: Secondary | ICD-10-CM

## 2019-06-03 DIAGNOSIS — Z1211 Encounter for screening for malignant neoplasm of colon: Secondary | ICD-10-CM

## 2019-06-03 DIAGNOSIS — D120 Benign neoplasm of cecum: Secondary | ICD-10-CM

## 2019-06-03 DIAGNOSIS — D123 Benign neoplasm of transverse colon: Secondary | ICD-10-CM

## 2019-06-03 MED ORDER — SODIUM CHLORIDE 0.9 % IV SOLN: 500.0000 mL | Freq: Once | INTRAVENOUS | Status: DC

## 2019-06-03 NOTE — Progress Notes (Signed)
Called to room to assist during endoscopic procedure.  Patient ID and intended procedure confirmed with present staff. Received instructions for my participation in the procedure from the performing physician.  

## 2019-06-03 NOTE — Op Note (Signed)
[facility] Patient Name: Nathan Camacho Procedure Date: 06/03/2019 11:10 AM MRN: [id number] Endoscopist: Mauri Pole , MD Age: 53 Referring MD:  Date of Birth: 23-Oct-1965 Gender: Male Account #: [id number] Procedure:                Colonoscopy Indications:              Screening in patient at increased risk: Family                            history of 1st-degree relative with colorectal                            cancer Medicines:                Monitored Anesthesia Care Procedure:                Pre-Anesthesia Assessment:                           - Prior to the procedure, a History and Physical                            was performed, and patient medications and                            allergies were reviewed. The patient's tolerance of                            previous anesthesia was also reviewed. The risks                            and benefits of the procedure and the sedation                            options and risks were discussed with the patient.                            All questions were answered, and informed consent                            was obtained. Prior Anticoagulants: The patient has                            taken no previous anticoagulant or antiplatelet                            agents. ASA Grade Assessment: II - A patient with                            mild systemic disease. After reviewing the risks                            and benefits, the patient was deemed in  satisfactory condition to undergo the procedure.                           After obtaining informed consent, the colonoscope                            was passed under direct vision. Throughout the                            procedure, the patient's blood pressure, pulse, and                            oxygen saturations were monitored continuously. The                            Colonoscope was introduced through the anus and                        advanced to the the cecum, identified by                            appendiceal orifice and ileocecal valve. The                            colonoscopy was performed without difficulty. The                            patient tolerated the procedure well. The quality                            of the bowel preparation was good. The ileocecal                            valve, appendiceal orifice, and rectum were                            photographed. Scope In: 11:16:45 AM Scope Out: 11:36:04 AM Scope Withdrawal Time: 0 hours 11 minutes 27 seconds  Total Procedure Duration: 0 hours 19 minutes 19 seconds  Findings:                 The perianal and digital rectal examinations were                            normal.                           A less than 1 mm polyp was found in the cecum. The                            polyp was sessile. The polyp was removed with a                            cold biopsy forceps. Resection and retrieval were  complete.                           A 3 mm polyp was found in the transverse colon. The                            polyp was sessile. The polyp was removed with a                            cold snare. Resection and retrieval were complete.                           Multiple small and large-mouthed diverticula were                            found in the sigmoid colon and descending colon.                           Non-bleeding internal hemorrhoids were found during                            retroflexion. The hemorrhoids were small. Complications:            No immediate complications. Estimated Blood Loss:     Estimated blood loss was minimal. Impression:               - One less than 1 mm polyp in the cecum, removed                            with a cold biopsy forceps. Resected and retrieved.                           - One 3 mm polyp in the transverse colon, removed                            with a  cold snare. Resected and retrieved.                           - Moderate diverticulosis in the sigmoid colon and                            in the descending colon.                           - Non-bleeding internal hemorrhoids. Recommendation:           - Patient has a contact number available for                            emergencies. The signs and symptoms of potential                            delayed complications were discussed with the  patient. Return to normal activities tomorrow.                            Written discharge instructions were provided to the                            patient.                           - Resume previous diet.                           - Continue present medications.                           - Await pathology results.                           - Repeat colonoscopy in 5 years for surveillance                            based on pathology results. Mauri Pole, MD 06/03/2019 11:40:54 AM This report has been signed electronically.

## 2019-06-03 NOTE — Progress Notes (Signed)
Pt's states no medical or surgical changes since previsit or office visit.  Covid- June Vitals- [place]  

## 2019-06-03 NOTE — Patient Instructions (Signed)
Handout on polyps, and divertiulosis given  YOU HAD AN ENDOSCOPIC PROCEDURE TODAY AT [facility]:   Refer to the procedure report that was given to you for any specific questions about what was found during the examination.  If the procedure report does not answer your questions, please call your gastroenterologist to clarify.  If you requested that your care partner not be given the details of your procedure findings, then the procedure report has been included in a sealed envelope for you to review at your convenience later.  YOU SHOULD EXPECT: Some feelings of bloating in the abdomen. Passage of more gas than usual.  Walking can help get rid of the air that was put into your GI tract during the procedure and reduce the bloating. If you had a lower endoscopy (such as a colonoscopy or flexible sigmoidoscopy) you may notice spotting of blood in your stool or on the toilet paper. If you underwent a bowel prep for your procedure, you may not have a normal bowel movement for a few days.  Please Note:  You might notice some irritation and congestion in your nose or some drainage.  This is from the oxygen used during your procedure.  There is no need for concern and it should clear up in a day or so.  SYMPTOMS TO REPORT IMMEDIATELY:   Following lower endoscopy (colonoscopy or flexible sigmoidoscopy):  Excessive amounts of blood in the stool  Significant tenderness or worsening of abdominal pains  Swelling of the abdomen that is new, acute  Fever of 100F or higher   For urgent or emergent issues, a gastroenterologist can be reached at any hour by calling [phone].   DIET:  We do recommend a small meal at first, but then you may proceed to your regular diet.  Drink plenty of fluids but you should avoid alcoholic beverages for 24 hours.  ACTIVITY:  You should plan to take it easy for the rest of today and you should NOT DRIVE or use heavy machinery until tomorrow (because of  the sedation medicines used during the test).    FOLLOW UP: Our staff will call the number listed on your records 48-72 hours following your procedure to check on you and address any questions or concerns that you may have regarding the information given to you following your procedure. If we do not reach you, we will leave a message.  We will attempt to reach you two times.  During this call, we will ask if you have developed any symptoms of COVID 19. If you develop any symptoms (ie: fever, flu-like symptoms, shortness of breath, cough etc.) before then, please call [phone].  If you test positive for Covid 19 in the 2 weeks post procedure, please call and report this information to [place].    If any biopsies were taken you will be contacted by phone or by letter within the next 1-3 weeks.  Please call us at [phone] if you have not heard about the biopsies in 3 weeks.    SIGNATURES/CONFIDENTIALITY: You and/or your care partner have signed paperwork which will be entered into your electronic medical record.  These signatures attest to the fact that that the information above on your After Visit Summary has been reviewed and is understood.  Full responsibility of the confidentiality of this discharge information lies with you and/or your care-partner.

## 2019-06-03 NOTE — Progress Notes (Signed)
To PACU, VSS. Report to [email or web address] 

## 2019-06-05 ENCOUNTER — Telehealth: Payer: Self-pay | Admitting: *Deleted

## 2019-06-05 NOTE — Telephone Encounter (Signed)
  Follow up Call-  Call back number 06/03/2019  Post procedure Call Back phone  # [phone]  Permission to leave phone message Yes  Some recent data might be hidden     Patient questions:  Do you have a fever, pain , or abdominal swelling? No. Pain Score  0 *  Have you tolerated food without any problems? Yes.    Have you been able to return to your normal activities? Yes.    Do you have any questions about your discharge instructions: Diet   No. Medications  No. Follow up visit  No.  Do you have questions or concerns about your Care? No.  Actions: * If pain score is 4 or above: No action needed, pain <4.  1. Have you developed a fever since your procedure? no  2.   Have you had an respiratory symptoms (SOB or cough) since your procedure? no  3.   Have you tested positive for COVID 19 since your procedure no  4.   Have you had any family members/close contacts diagnosed with the COVID 19 since your procedure?  no   If yes to any of these questions please route to Joylene John, RN and Alphonsa Gin, Therapist, sports.

## 2019-06-10 ENCOUNTER — Encounter: Payer: Self-pay | Admitting: Gastroenterology

## 2019-07-08 ENCOUNTER — Other Ambulatory Visit: Payer: Self-pay | Admitting: Family Medicine

## 2019-07-08 NOTE — Telephone Encounter (Signed)
Name of Medication: Adderall XR 20 mg Name of Pharmacy: [facility] or Written Date and Quantity: # 30 on 05/26/19 Last Office Visit and Type: 12/24/18 acute and 07/25/2018 annual Next Office Visit and Type:07/27/19 CPX

## 2019-07-09 MED ORDER — AMPHETAMINE-DEXTROAMPHET ER 20 MG PO CP24: 20.0000 mg | ORAL_CAPSULE | ORAL | 0 refills | Status: DC

## 2019-07-09 NOTE — Telephone Encounter (Signed)
ERx 

## 2019-07-23 ENCOUNTER — Other Ambulatory Visit: Payer: Self-pay | Admitting: Family Medicine

## 2019-07-23 DIAGNOSIS — E785 Hyperlipidemia, unspecified: Secondary | ICD-10-CM

## 2019-07-23 DIAGNOSIS — Z125 Encounter for screening for malignant neoplasm of prostate: Secondary | ICD-10-CM

## 2019-07-24 ENCOUNTER — Other Ambulatory Visit: Payer: Self-pay

## 2019-07-27 ENCOUNTER — Ambulatory Visit: Payer: BC Managed Care – PPO | Attending: Internal Medicine

## 2019-07-27 ENCOUNTER — Encounter: Payer: Self-pay | Admitting: Family Medicine

## 2019-07-27 DIAGNOSIS — Z20822 Contact with and (suspected) exposure to covid-19: Secondary | ICD-10-CM

## 2019-07-28 LAB — NOVEL CORONAVIRUS, NAA: SARS-CoV-2, NAA: NOT DETECTED

## 2019-08-10 ENCOUNTER — Other Ambulatory Visit: Payer: Self-pay

## 2019-08-10 ENCOUNTER — Other Ambulatory Visit (INDEPENDENT_AMBULATORY_CARE_PROVIDER_SITE_OTHER): Payer: BC Managed Care – PPO

## 2019-08-10 DIAGNOSIS — Z125 Encounter for screening for malignant neoplasm of prostate: Secondary | ICD-10-CM | POA: Diagnosis not present

## 2019-08-10 DIAGNOSIS — E785 Hyperlipidemia, unspecified: Secondary | ICD-10-CM

## 2019-08-10 LAB — COMPREHENSIVE METABOLIC PANEL
ALT: 27 U/L (ref 0–53)
AST: 18 U/L (ref 0–37)
Albumin: 4.5 g/dL (ref 3.5–5.2)
Alkaline Phosphatase: 78 U/L (ref 39–117)
BUN: 12 mg/dL (ref 6–23)
CO2: 24 mEq/L (ref 19–32)
Calcium: 9.3 mg/dL (ref 8.4–10.5)
Chloride: 107 mEq/L (ref 96–112)
Creatinine, Ser: 0.95 mg/dL (ref 0.40–1.50)
GFR: 83.35 mL/min (ref >60.00)
Glucose, Bld: 106 mg/dL — ABNORMAL HIGH (ref 70–99)
Potassium: 4.4 mEq/L (ref 3.5–5.1)
Sodium: 146 mEq/L — ABNORMAL HIGH (ref 135–145)
Total Bilirubin: 0.4 mg/dL (ref 0.2–1.2)
Total Protein: 6.6 g/dL (ref 6.0–8.3)

## 2019-08-10 LAB — LIPID PANEL
Cholesterol: 222 mg/dL — ABNORMAL HIGH (ref 0–200)
HDL: 42.5 mg/dL (ref >39.00)
LDL Cholesterol: 151 mg/dL — ABNORMAL HIGH (ref 0–99)
NonHDL: 179.23
Total CHOL/HDL Ratio: 5
Triglycerides: 141 mg/dL (ref 0.0–149.0)
VLDL: 28.2 mg/dL (ref 0.0–40.0)

## 2019-08-10 LAB — PSA: PSA: 0.35 ng/mL (ref 0.10–4.00)

## 2019-08-16 ENCOUNTER — Encounter: Payer: Self-pay | Admitting: Family Medicine

## 2019-08-16 NOTE — Progress Notes (Signed)
This visit was conducted in person.  BP 122/84 (BP Location: Left Arm, Patient Position: Sitting, Cuff Size: Large)   Pulse 62   Temp 97.8 F (36.6 C) (Temporal)   Ht 6' (1.829 m)   Wt 294 lb 7 oz (133.6 kg)   SpO2 98%   BMI 39.93 kg/m    CC: CPE Subjective:    Patient ID: Nathan Camacho, male    DOB: 1966-05-05, 54 y.o.   MRN: [id number]  HPI: Nathan Camacho is a 54 y.o. male presenting on 08/17/2019 for Annual Exam   ADD - on adderall 20mg  XR daily 6am, stopped 07/25/2019 due to 2 wks off work. Finding less effective than normal.   Dry cough present for last few months, worse with prolonged driving, better when he is up and about. Some tightness at upper chest. No relation to food, just occasional GERD. Allergies overall stable - on flonase and allegra PRN spring/fall. Mild PNdrainage. No dyspnea or wheezing. No fevers/chills.   Preventative: COLONOSCOPY 05/2019 - TAx2, diverticulosis, rpt 5 yrs (Nandigam)  Prostate cancer screening - baseline DRE/PSA normal, rpt yearly PSA Flu shot yearly Td 2008. Tdap 07/2017 Zostavax - discussed, first dose today Seat belt use discussed Sunscreen use discussed. No changing moles on skin.  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 Interim medical history since our last visit reviewed. Allergies and medications reviewed and updated. Outpatient Medications Prior to Visit  Medication Sig Dispense Refill  . aspirin 81 MG tablet Take 81 mg by mouth daily.      . fexofenadine (ALLEGRA) 180 MG tablet Take 1 tablet (180 mg  total) by mouth daily. 30 tablet 11  . fluticasone (FLONASE) 50 MCG/ACT nasal spray Place 2 sprays into both nostrils daily. 16 g 11  . Multiple Vitamin (MULTIVITAMIN) tablet Take 1 tablet by mouth daily.      Marland Kitchen amphetamine-dextroamphetamine (ADDERALL XR) 20 MG 24 hr capsule Take 1 capsule (20 mg total) by mouth every morning. (Patient not taking: Reported on 08/17/2019) 30 capsule 0   No facility-administered medications prior to visit.     Per HPI unless specifically indicated in ROS section below Review of Systems  Constitutional: Negative for activity change, appetite change, chills, fatigue, fever and unexpected weight change.  HENT: Negative for hearing loss.   Eyes: Negative for visual disturbance.  Respiratory: Positive for cough (dry). Negative for chest tightness, shortness of breath and wheezing.   Cardiovascular: Negative for chest pain, palpitations and leg swelling.  Gastrointestinal: Negative for abdominal distention, abdominal pain, blood in stool, constipation, diarrhea, nausea and vomiting.  Genitourinary: Negative for difficulty urinating and hematuria.  Musculoskeletal: Negative for arthralgias, myalgias and neck pain.  Skin: Negative for rash.  Neurological: Negative for dizziness, seizures, syncope and headaches.  Hematological: Negative for adenopathy. Does not bruise/bleed easily.  Psychiatric/Behavioral: Negative for dysphoric mood. The patient is not nervous/anxious.    Objective:    BP 122/84 (BP Location: Left Arm, Patient Position: Sitting, Cuff Size: Large)   Pulse 62   Temp 97.8 F (36.6 C) (Temporal)   Ht 6' (1.829  m)   Wt 294 lb 7 oz (133.6 kg)   SpO2 98%   BMI 39.93 kg/m   Wt Readings from Last 3 Encounters:  08/17/19 294 lb 7 oz (133.6 kg)  06/03/19 295 lb (133.8 kg)  04/24/19 295 lb (133.8 kg)    Physical Exam Vitals and nursing note reviewed.  Constitutional:      General: He is not in acute distress.    Appearance: Normal appearance. He  is well-developed. He is obese. He is not ill-appearing.  HENT:     Head: Normocephalic and atraumatic.     Right Ear: Hearing, tympanic membrane, ear canal and external ear normal.     Left Ear: Hearing, tympanic membrane, ear canal and external ear normal.     Mouth/Throat:     Pharynx: Uvula midline.  Eyes:     General: No scleral icterus.    Extraocular Movements: Extraocular movements intact.     Conjunctiva/sclera: Conjunctivae normal.     Pupils: Pupils are equal, round, and reactive to light.  Neck:     Thyroid: No thyromegaly or thyroid tenderness.  Cardiovascular:     Rate and Rhythm: Normal rate and regular rhythm.     Pulses: Normal pulses.          Radial pulses are 2+ on the right side and 2+ on the left side.     Heart sounds: Normal heart sounds. No murmur.  Pulmonary:     Effort: Pulmonary effort is normal. No respiratory distress.     Breath sounds: Normal breath sounds. No wheezing, rhonchi or rales.  Abdominal:     General: Abdomen is flat. Bowel sounds are normal. There is no distension.     Palpations: Abdomen is soft. There is no mass.     Tenderness: There is no abdominal tenderness. There is no guarding or rebound.     Hernia: No hernia is present.     Comments: Firm nontender lump below L ribcage  Musculoskeletal:        General: Normal range of motion.     Cervical back: Normal range of motion and neck supple.     Right lower leg: No edema.     Left lower leg: No edema.  Lymphadenopathy:     Cervical: No cervical adenopathy.  Skin:    General: Skin is warm and dry.     Findings: No rash.  Neurological:     General: No focal deficit present.     Mental Status: He is alert and oriented to person, place, and time.     Comments: CN grossly intact, station and gait intact  Psychiatric:        Mood and Affect: Mood normal.        Behavior: Behavior normal.        Thought Content: Thought content normal.        Judgment: Judgment normal.         Results for orders placed or performed in visit on 08/10/19  PSA  Result Value Ref Range   PSA 0.35 0.10 - 4.00 ng/mL  Comprehensive metabolic panel  Result Value Ref Range   Sodium 146 (H) 135 - 145 mEq/L   Potassium 4.4 3.5 - 5.1 mEq/L   Chloride 107 96 - 112 mEq/L   CO2 24 19 - 32 mEq/L   Glucose, Bld 106 (H) 70 - 99 mg/dL   BUN 12 6 - 23 mg/dL   Creatinine, Ser 0.95 0.40 - 1.50 mg/dL  Total Bilirubin 0.4 0.2 - 1.2 mg/dL   Alkaline Phosphatase 78 39 - 117 U/L   AST 18 0 - 37 U/L   ALT 27 0 - 53 U/L   Total Protein 6.6 6.0 - 8.3 g/dL   Albumin 4.5 3.5 - 5.2 g/dL   GFR 83.35 >60.00 mL/min   Calcium 9.3 8.4 - 10.5 mg/dL  Lipid panel  Result Value Ref Range   Cholesterol 222 (H) 0 - 200 mg/dL   Triglycerides 141.0 0.0 - 149.0 mg/dL   HDL 42.50 >39.00 mg/dL   VLDL 28.2 0.0 - 40.0 mg/dL   LDL Cholesterol 151 (H) 0 - 99 mg/dL   Total CHOL/HDL Ratio 5    NonHDL 179.23    Assessment & Plan:  This visit occurred during the SARS-CoV-2 public health emergency.  Safety protocols were in place, including screening questions prior to the visit, additional usage of staff PPE, and extensive cleaning of exam room while observing appropriate contact time as indicated for disinfecting solutions.   Problem List Items Addressed This Visit    Persistent dry cough    Benign exam today - anticipate allergic or GERD related - rec daily allegra/flonase x 1wk and if ineffective to control cough, then trial daily pepcid at night.       Obesity, morbid, BMI 40.0-49.9 ([place])    Encouraged healthy diet and lifestyle changes to affect sustainable weight loss. He is considering daily walk at lunch.       Hyperglycemia    Encouraged weight loss. Discussed possible prediabetes.       Health maintenance examination - Primary    Preventative protocols reviewed and updated unless pt declined. Discussed healthy diet and lifestyle.       Dyslipidemia    Chronic, mixed results off meds.  The 10-year  ASCVD risk score Mikey Bussing DC Brooke Bonito., et al., 2013) is: 4.8%   Values used to calculate the score:     Age: 27 years     Sex: Male     Is Non-Hispanic African American: No     Diabetic: No     Tobacco smoker: No     Systolic Blood Pressure: 123XX123 mmHg     Is BP treated: No     HDL Cholesterol: 42.5 mg/dL     Total Cholesterol: 222 mg/dL       Adult ADHD    Chronic, stable. Finds adderall XL 20mg  daily losing effect - he does take weekend breaks. Suggested daily adderall use 7d/wk. Update with effect. Consider increased dose if ineffective.  Update UDS and controlled substance agreement today.       Relevant Orders   Pain Mgmt, Profile 8 w/Conf, U    Other Visit Diagnoses    Need for shingles vaccine       Relevant Orders   Varicella-zoster vaccine IM (Completed)       No orders of the defined types were placed in this encounter.  Orders Placed This Encounter  Procedures  . Varicella-zoster vaccine IM  . Pain Mgmt, Profile 8 w/Conf, U    Order Specific Question:   Prescribed drugs 1:    Answer:   AMPHETAMINE    Follow up plan: Return in about 1 year (around 08/16/2020) for annual exam, prior fasting for blood work.  Ria Bush, MD

## 2019-08-17 ENCOUNTER — Encounter: Payer: Self-pay | Admitting: Family Medicine

## 2019-08-17 ENCOUNTER — Ambulatory Visit (INDEPENDENT_AMBULATORY_CARE_PROVIDER_SITE_OTHER): Payer: BC Managed Care – PPO | Admitting: Family Medicine

## 2019-08-17 ENCOUNTER — Other Ambulatory Visit: Payer: Self-pay

## 2019-08-17 VITALS — BP 122/84 | HR 62 | Temp 97.8°F | Ht 72.0 in | Wt 294.4 lb

## 2019-08-17 DIAGNOSIS — R053 Chronic cough: Secondary | ICD-10-CM

## 2019-08-17 DIAGNOSIS — Z0000 Encounter for general adult medical examination without abnormal findings: Secondary | ICD-10-CM | POA: Diagnosis not present

## 2019-08-17 DIAGNOSIS — R739 Hyperglycemia, unspecified: Secondary | ICD-10-CM

## 2019-08-17 DIAGNOSIS — E785 Hyperlipidemia, unspecified: Secondary | ICD-10-CM

## 2019-08-17 DIAGNOSIS — Z23 Encounter for immunization: Secondary | ICD-10-CM | POA: Diagnosis not present

## 2019-08-17 DIAGNOSIS — R05 Cough: Secondary | ICD-10-CM

## 2019-08-17 DIAGNOSIS — F909 Attention-deficit hyperactivity disorder, unspecified type: Secondary | ICD-10-CM

## 2019-08-17 NOTE — Assessment & Plan Note (Signed)
Benign exam today - anticipate allergic or GERD related - rec daily allegra/flonase x 1wk and if ineffective to control cough, then trial daily pepcid at night.

## 2019-08-17 NOTE — Assessment & Plan Note (Addendum)
Chronic, stable. Finds adderall XL 20mg  daily losing effect - he does take weekend breaks. Suggested daily adderall use 7d/wk. Update with effect. Consider increased dose if ineffective.  Update UDS and controlled substance agreement today.

## 2019-08-17 NOTE — Assessment & Plan Note (Addendum)
Encouraged weight loss. Discussed possible prediabetes.

## 2019-08-17 NOTE — Assessment & Plan Note (Signed)
Preventative protocols reviewed and updated unless pt declined. Discussed healthy diet and lifestyle.  

## 2019-08-17 NOTE — Assessment & Plan Note (Signed)
Chronic, mixed results off meds.  The 10-year ASCVD risk score Mikey Bussing DC Brooke Bonito., et al., 2013) is: 4.8%   Values used to calculate the score:     Age: 54 years     Sex: Male     Is Non-Hispanic African American: No     Diabetic: No     Tobacco smoker: No     Systolic Blood Pressure: 123XX123 mmHg     Is BP treated: No     HDL Cholesterol: 42.5 mg/dL     Total Cholesterol: 222 mg/dL

## 2019-08-17 NOTE — Assessment & Plan Note (Signed)
Exam consistent with lipoma

## 2019-08-17 NOTE — Assessment & Plan Note (Signed)
Encouraged healthy diet and lifestyle changes to affect sustainable weight loss. He is considering daily walk at lunch.

## 2019-08-17 NOTE — Patient Instructions (Addendum)
Shingrix today. Return in 2-6 months for nurse visit to complete series.  UDS today.  Try taking adderall XR daily even on weekends - update me with efrect on attention/concentration levels.  Work on regular exercise routine. AHA recommend 150 min mod intensity aerobic exercise weekly.  Try daily allegra/ flonase for 1 week and monitor effect on cough. If not improved, may try nightly pepcid 20mg  OTC for 1 week and monitor effect on cough.  Return as needed or in 1 year for next physical.   Health Maintenance, Male Adopting a healthy lifestyle and getting preventive care are important in promoting health and wellness. Ask your health care provider about:  The right schedule for you to have regular tests and exams.  Things you can do on your own to prevent diseases and keep yourself healthy. What should I know about diet, weight, and exercise? Eat a healthy diet   Eat a diet that includes plenty of vegetables, fruits, low-fat dairy products, and lean protein.  Do not eat a lot of foods that are high in solid fats, added sugars, or sodium. Maintain a healthy weight Body mass index (BMI) is a measurement that can be used to identify possible weight problems. It estimates body fat based on height and weight. Your health care provider can help determine your BMI and help you achieve or maintain a healthy weight. Get regular exercise Get regular exercise. This is one of the most important things you can do for your health. Most adults should:  Exercise for at least 150 minutes each week. The exercise should increase your heart rate and make you sweat (moderate-intensity exercise).  Do strengthening exercises at least twice a week. This is in addition to the moderate-intensity exercise.  Spend less time sitting. Even light physical activity can be beneficial. Watch cholesterol and blood lipids Have your blood tested for lipids and cholesterol at 52 years of age, then have this test every 5  years. You may need to have your cholesterol levels checked more often if:  Your lipid or cholesterol levels are high.  You are older than 52 years of age.  You are at high risk for heart disease. What should I know about cancer screening? Many types of cancers can be detected early and may often be prevented. Depending on your health history and family history, you may need to have cancer screening at various ages. This may include screening for:  Colorectal cancer.  Prostate cancer.  Skin cancer.  Lung cancer. What should I know about heart disease, diabetes, and high blood pressure? Blood pressure and heart disease  High blood pressure causes heart disease and increases the risk of stroke. This is more likely to develop in people who have high blood pressure readings, are of African descent, or are overweight.  Talk with your health care provider about your target blood pressure readings.  Have your blood pressure checked: ? Every 3-5 years if you are 36-12 years of age. ? Every year if you are 46 years old or older.  If you are between the ages of 46 and 58 and are a current or former smoker, ask your health care provider if you should have a one-time screening for abdominal aortic aneurysm (AAA). Diabetes Have regular diabetes screenings. This checks your fasting blood sugar level. Have the screening done:  Once every three years after age 1 if you are at a normal weight and have a low risk for diabetes.  More often and at a  younger age if you are overweight or have a high risk for diabetes. What should I know about preventing infection? Hepatitis B If you have a higher risk for hepatitis B, you should be screened for this virus. Talk with your health care provider to find out if you are at risk for hepatitis B infection. Hepatitis C Blood testing is recommended for:  Everyone born from [birth year] through 1963.  Anyone with known risk factors for hepatitis C. Sexually  transmitted infections (STIs)  You should be screened each year for STIs, including gonorrhea and chlamydia, if: ? You are sexually active and are younger than 52 years of age. ? You are older than 52 years of age and your health care provider tells you that you are at risk for this type of infection. ? Your sexual activity has changed since you were last screened, and you are at increased risk for chlamydia or gonorrhea. Ask your health care provider if you are at risk.  Ask your health care provider about whether you are at high risk for HIV. Your health care provider may recommend a prescription medicine to help prevent HIV infection. If you choose to take medicine to prevent HIV, you should first get tested for HIV. You should then be tested every 3 months for as long as you are taking the medicine. Follow these instructions at home: Lifestyle  Do not use any products that contain nicotine or tobacco, such as cigarettes, e-cigarettes, and chewing tobacco. If you need help quitting, ask your health care provider.  Do not use street drugs.  Do not share needles.  Ask your health care provider for help if you need support or information about quitting drugs. Alcohol use  Do not drink alcohol if your health care provider tells you not to drink.  If you drink alcohol: ? Limit how much you have to 0-2 drinks a day. ? Be aware of how much alcohol is in your drink. In the [place] one drink equals one 12 oz bottle of beer (355 mL), one 5 oz glass of wine (148 mL), or one 1 oz glass of hard liquor (44 mL). General instructions  Schedule regular health, dental, and eye exams.  Stay current with your vaccines.  Tell your health care provider if: ? You often feel depressed. ? You have ever been abused or do not feel safe at home. Summary  Adopting a healthy lifestyle and getting preventive care are important in promoting health and wellness.  Follow your health care provider's  instructions about healthy diet, exercising, and getting tested or screened for diseases.  Follow your health care provider's instructions on monitoring your cholesterol and blood pressure. This information is not intended to replace advice given to you by your health care provider. Make sure you discuss any questions you have with your health care provider. Document Revised: 07/16/2018 Document Reviewed: 07/16/2018 Elsevier Patient Education  2020 [employer].

## 2019-08-18 LAB — PAIN MGMT, PROFILE 8 W/CONF, U
6 Acetylmorphine: NEGATIVE ng/mL
Alcohol Metabolites: NEGATIVE ng/mL (ref <500)
Amphetamines: NEGATIVE ng/mL
Benzodiazepines: NEGATIVE ng/mL
Buprenorphine, Urine: NEGATIVE ng/mL
Cocaine Metabolite: NEGATIVE ng/mL
Creatinine: 146.1 mg/dL
MDMA: NEGATIVE ng/mL
Marijuana Metabolite: NEGATIVE ng/mL
Opiates: NEGATIVE ng/mL
Oxidant: NEGATIVE ug/mL
Oxycodone: NEGATIVE ng/mL
pH: 7.2 (ref 4.5–9.0)

## 2019-08-19 ENCOUNTER — Other Ambulatory Visit: Payer: Self-pay | Admitting: Family Medicine

## 2019-08-19 MED ORDER — AMPHETAMINE-DEXTROAMPHET ER 20 MG PO CP24: 20.0000 mg | ORAL_CAPSULE | ORAL | 0 refills | Status: DC

## 2019-09-24 ENCOUNTER — Other Ambulatory Visit: Payer: Self-pay | Admitting: Family Medicine

## 2019-09-24 NOTE — Telephone Encounter (Signed)
Upcoming Ov 08/18/2020  Last refilled 08/19/2019 #30 x 0  Please advise, thanks.

## 2019-09-25 MED ORDER — AMPHETAMINE-DEXTROAMPHET ER 20 MG PO CP24: 20.0000 mg | ORAL_CAPSULE | ORAL | 0 refills | Status: DC

## 2019-09-25 NOTE — Telephone Encounter (Signed)
ERx 

## 2019-10-22 ENCOUNTER — Other Ambulatory Visit: Payer: Self-pay | Admitting: Family Medicine

## 2019-10-22 MED ORDER — AMPHETAMINE-DEXTROAMPHET ER 25 MG PO CP24: 25.0000 mg | ORAL_CAPSULE | ORAL | 0 refills | Status: DC

## 2019-10-22 NOTE — Telephone Encounter (Signed)
Reasonable to try higher dose. 25mg  dose sent in.

## 2019-10-22 NOTE — Telephone Encounter (Signed)
Patient advised.

## 2019-10-22 NOTE — Telephone Encounter (Signed)
Not sure if you could see the following attached message from pt:  Good morning, I have been taking the Adderall XR 20mg  everyday as we discussed during my yearly physical. I am still not focusing like I did in the past. You mentioned changing the strength a little. Can we try 25mg  or whatever you recommend for a month and see if that helps? Thank you for your time. Nathan Camacho  Name of Medication: Adderall XR Name of Pharmacy: [facility] Last Fill or Written Date and Quantity: 09/25/19, #30 Last Office Visit and Type: 08/17/19, CPE Next Office Visit and Type: 08/18/20, CPE Last Controlled Substance Agreement Date: 08/17/19 Last UDS: 08/17/19

## 2019-10-27 ENCOUNTER — Encounter: Payer: Self-pay | Admitting: Family Medicine

## 2019-10-27 NOTE — Telephone Encounter (Signed)
Submitted PA for Adderall XR 25 mg cap; key: [id number].  Decision pending.  Notified pt via [place].

## 2019-10-30 NOTE — Telephone Encounter (Signed)
PA approved for dates 10/29/19-10/29/2022. Patient advised through [employer]

## 2019-11-17 ENCOUNTER — Ambulatory Visit: Payer: BC Managed Care – PPO

## 2019-11-26 ENCOUNTER — Other Ambulatory Visit: Payer: Self-pay | Admitting: Family Medicine

## 2019-11-26 NOTE — Telephone Encounter (Signed)
Note from pt via pharmacy: Everything is going well. The change from 20mg  to 25mg  has helped a lot. No issues. Thank you  Name of Medication: Adderall XR Name of Pharmacy: [facility] Last Fill or Written Date and Quantity: 10/22/19, #30 Last Office Visit and Type: 08/17/19, CPE Next Office Visit and Type: 08/19/19, CPE Last Controlled Substance Agreement Date: 08/17/19 Last UDS: 08/17/19

## 2019-11-29 MED ORDER — AMPHETAMINE-DEXTROAMPHET ER 25 MG PO CP24: 25.0000 mg | ORAL_CAPSULE | ORAL | 0 refills | Status: DC

## 2019-11-29 NOTE — Telephone Encounter (Signed)
ERx 

## 2019-12-01 ENCOUNTER — Ambulatory Visit: Payer: BC Managed Care – PPO

## 2019-12-29 ENCOUNTER — Other Ambulatory Visit: Payer: Self-pay | Admitting: Family Medicine

## 2019-12-29 NOTE — Telephone Encounter (Signed)
Last office visit 08/17/2019 for CPE.  Last refilled 11/29/2019 for #30 with no refills.  CPE scheduled for 08/18/2020.  UDS/Contract 08/17/2019.

## 2019-12-31 ENCOUNTER — Ambulatory Visit: Payer: BC Managed Care – PPO

## 2019-12-31 MED ORDER — AMPHETAMINE-DEXTROAMPHET ER 25 MG PO CP24: 25.0000 mg | ORAL_CAPSULE | ORAL | 0 refills | Status: DC

## 2019-12-31 NOTE — Telephone Encounter (Signed)
ERx 

## 2020-01-05 ENCOUNTER — Ambulatory Visit (INDEPENDENT_AMBULATORY_CARE_PROVIDER_SITE_OTHER): Payer: BC Managed Care – PPO | Admitting: *Deleted

## 2020-01-05 DIAGNOSIS — Z23 Encounter for immunization: Secondary | ICD-10-CM

## 2020-01-05 NOTE — Progress Notes (Signed)
Per orders of Dr. Danise Mina, injection of Shingrix #2 given by Virl Cagey. Patient tolerated injection well.

## 2020-01-06 ENCOUNTER — Encounter: Payer: Self-pay | Admitting: Family Medicine

## 2020-01-06 NOTE — Telephone Encounter (Signed)
Updated pt's chart.  

## 2020-02-01 ENCOUNTER — Other Ambulatory Visit: Payer: Self-pay | Admitting: Family Medicine

## 2020-02-01 MED ORDER — AMPHETAMINE-DEXTROAMPHET ER 25 MG PO CP24: 25.0000 mg | ORAL_CAPSULE | ORAL | 0 refills | Status: DC

## 2020-02-01 NOTE — Telephone Encounter (Signed)
Name of Medication: Adderall XR Name of Pharmacy: [facility] Last Fill or Written Date and Quantity: 12/31/19, #30 Last Office Visit and Type: 08/17/19, CPE Next Office Visit and Type: 08/18/20, CPE Last Controlled Substance Agreement Date: 08/17/19 Last UDS: 08/17/19

## 2020-02-01 NOTE — Telephone Encounter (Signed)
ERx 

## 2020-03-07 ENCOUNTER — Other Ambulatory Visit: Payer: Self-pay | Admitting: Family Medicine

## 2020-03-07 MED ORDER — AMPHETAMINE-DEXTROAMPHET ER 25 MG PO CP24: 25.0000 mg | ORAL_CAPSULE | ORAL | 0 refills | Status: DC

## 2020-03-07 NOTE — Telephone Encounter (Signed)
Last office visit 08/17/2019 for CPE.  Last refilled 02/01/2020 for 30 with no refills.  UDS/Contract 08/17/2019.  CPE schedule for 08/18/2020.

## 2020-03-07 NOTE — Telephone Encounter (Signed)
ERx 

## 2020-04-06 ENCOUNTER — Other Ambulatory Visit: Payer: Self-pay | Admitting: Family Medicine

## 2020-04-08 MED ORDER — AMPHETAMINE-DEXTROAMPHET ER 25 MG PO CP24: 25.0000 mg | ORAL_CAPSULE | ORAL | 0 refills | Status: DC

## 2020-04-08 NOTE — Telephone Encounter (Signed)
Name of Medication: Adderall XR Name of Pharmacy: [facility] Last Fill or Written Date and Quantity: 03/07/20, #30 Last Office Visit and Type: 08/17/19, CPE Next Office Visit and Type: 08/18/20, CPE Last Controlled Substance Agreement Date: 08/17/19 Last UDS: 08/17/19

## 2020-04-08 NOTE — Telephone Encounter (Signed)
ERx 

## 2020-05-09 ENCOUNTER — Other Ambulatory Visit: Payer: Self-pay | Admitting: Family Medicine

## 2020-05-10 NOTE — Telephone Encounter (Signed)
Name of Medication: Adderall Name of Pharmacy: [facility] Last Fill or Written Date and Quantity: 04/18/20 #30 Last Office Visit and Type: 08/17/19 Next Office Visit and Type: 08/18/20 Last Controlled Substance Agreement Date: 08/17/19 Last UDS: 08/17/19

## 2020-05-12 MED ORDER — AMPHETAMINE-DEXTROAMPHET ER 25 MG PO CP24: 25.0000 mg | ORAL_CAPSULE | ORAL | 0 refills | Status: DC

## 2020-05-12 NOTE — Telephone Encounter (Signed)
ERx 

## 2020-05-12 NOTE — Telephone Encounter (Signed)
Pt is calling in ref to status of refill for adderall.  He is out of medication as of today.  Please advise.  Thank you!

## 2020-06-17 ENCOUNTER — Other Ambulatory Visit: Payer: Self-pay | Admitting: Family Medicine

## 2020-06-17 MED ORDER — AMPHETAMINE-DEXTROAMPHET ER 25 MG PO CP24: 25.0000 mg | ORAL_CAPSULE | ORAL | 0 refills | Status: DC

## 2020-06-17 NOTE — Telephone Encounter (Signed)
ERx 

## 2020-06-17 NOTE — Telephone Encounter (Signed)
Pharmacy requests refill on: Amphetamine-Dextroamphetamine 25 mg 24 hr  LAST REFILL: 05/09/2020 LAST OV: 08/17/2019 NEXT OV: 08/18/2020 PHARMACY: [place], [place]

## 2020-06-24 ENCOUNTER — Telehealth: Payer: Self-pay | Admitting: Family Medicine

## 2020-06-24 NOTE — Telephone Encounter (Signed)
Need to reschedule 08/18/20 appt Left vm for the patient to call back. NO details left

## 2020-07-17 ENCOUNTER — Other Ambulatory Visit: Payer: Self-pay | Admitting: Family Medicine

## 2020-07-18 NOTE — Telephone Encounter (Signed)
Name of Medication: Adderall XR25mg    Name of Pharmacy: [facility] or Written Date and Quantity:  06/17/20, #30  Last Office Visit and Type: 08/17/19 Health Maintenance  Next Office Visit and Type: 09/21/19 Annual, rescheduled from 1/13 by our office Last Controlled Substance Agreement Date: 08/17/19 Last UDS: 08/17/19

## 2020-07-19 MED ORDER — AMPHETAMINE-DEXTROAMPHET ER 25 MG PO CP24: 25.0000 mg | ORAL_CAPSULE | ORAL | 0 refills | Status: DC

## 2020-07-19 NOTE — Telephone Encounter (Signed)
ERx 

## 2020-08-16 ENCOUNTER — Other Ambulatory Visit: Payer: BC Managed Care – PPO

## 2020-08-18 ENCOUNTER — Other Ambulatory Visit: Payer: Self-pay | Admitting: Family Medicine

## 2020-08-18 ENCOUNTER — Encounter: Payer: BC Managed Care – PPO | Admitting: Family Medicine

## 2020-08-18 NOTE — Telephone Encounter (Signed)
Name of Medication: Adderall XR Name of Pharmacy: [facility] Last Fill or Written Date and Quantity: 07/19/20, #30 Last Office Visit and Type: 08/17/19, CPE Next Office Visit and Type: 09/20/20, CPE Last Controlled Substance Agreement Date: 08/17/19 Last UDS: 08/17/19

## 2020-08-20 MED ORDER — AMPHETAMINE-DEXTROAMPHET ER 25 MG PO CP24: 25.0000 mg | ORAL_CAPSULE | ORAL | 0 refills | Status: DC

## 2020-08-20 NOTE — Telephone Encounter (Signed)
ERx 

## 2020-09-04 ENCOUNTER — Other Ambulatory Visit: Payer: Self-pay | Admitting: Family Medicine

## 2020-09-04 DIAGNOSIS — Z125 Encounter for screening for malignant neoplasm of prostate: Secondary | ICD-10-CM

## 2020-09-04 DIAGNOSIS — R739 Hyperglycemia, unspecified: Secondary | ICD-10-CM

## 2020-09-04 DIAGNOSIS — Z1159 Encounter for screening for other viral diseases: Secondary | ICD-10-CM

## 2020-09-04 DIAGNOSIS — E785 Hyperlipidemia, unspecified: Secondary | ICD-10-CM

## 2020-09-08 ENCOUNTER — Other Ambulatory Visit: Payer: Self-pay

## 2020-09-08 ENCOUNTER — Other Ambulatory Visit (INDEPENDENT_AMBULATORY_CARE_PROVIDER_SITE_OTHER): Payer: BC Managed Care – PPO

## 2020-09-08 DIAGNOSIS — Z125 Encounter for screening for malignant neoplasm of prostate: Secondary | ICD-10-CM | POA: Diagnosis not present

## 2020-09-08 DIAGNOSIS — Z1159 Encounter for screening for other viral diseases: Secondary | ICD-10-CM | POA: Diagnosis not present

## 2020-09-08 DIAGNOSIS — R739 Hyperglycemia, unspecified: Secondary | ICD-10-CM | POA: Diagnosis not present

## 2020-09-08 DIAGNOSIS — E785 Hyperlipidemia, unspecified: Secondary | ICD-10-CM

## 2020-09-08 LAB — COMPREHENSIVE METABOLIC PANEL
ALT: 27 U/L (ref 0–53)
AST: 16 U/L (ref 0–37)
Albumin: 4.4 g/dL (ref 3.5–5.2)
Alkaline Phosphatase: 84 U/L (ref 39–117)
BUN: 19 mg/dL (ref 6–23)
CO2: 30 mEq/L (ref 19–32)
Calcium: 9.6 mg/dL (ref 8.4–10.5)
Chloride: 104 mEq/L (ref 96–112)
Creatinine, Ser: 1.06 mg/dL (ref 0.40–1.50)
GFR: 80.54 mL/min (ref >60.00)
Glucose, Bld: 95 mg/dL (ref 70–99)
Potassium: 4.9 mEq/L (ref 3.5–5.1)
Sodium: 138 mEq/L (ref 135–145)
Total Bilirubin: 0.6 mg/dL (ref 0.2–1.2)
Total Protein: 6.8 g/dL (ref 6.0–8.3)

## 2020-09-08 LAB — HEMOGLOBIN A1C: Hgb A1c MFr Bld: 5.6 % (ref 4.6–6.5)

## 2020-09-08 LAB — LIPID PANEL
Cholesterol: 186 mg/dL (ref 0–200)
HDL: 49.4 mg/dL (ref >39.00)
LDL Cholesterol: 117 mg/dL — ABNORMAL HIGH (ref 0–99)
NonHDL: 136.24
Total CHOL/HDL Ratio: 4
Triglycerides: 95 mg/dL (ref 0.0–149.0)
VLDL: 19 mg/dL (ref 0.0–40.0)

## 2020-09-08 LAB — PSA: PSA: 0.47 ng/mL (ref 0.10–4.00)

## 2020-09-09 LAB — HEPATITIS C ANTIBODY
Hepatitis C Ab: NONREACTIVE
SIGNAL TO CUT-OFF: 0 (ref <1.00)

## 2020-09-13 ENCOUNTER — Encounter: Payer: BC Managed Care – PPO | Admitting: Family Medicine

## 2020-09-20 ENCOUNTER — Encounter: Payer: Self-pay | Admitting: Family Medicine

## 2020-09-20 ENCOUNTER — Other Ambulatory Visit: Payer: Self-pay

## 2020-09-20 ENCOUNTER — Ambulatory Visit (INDEPENDENT_AMBULATORY_CARE_PROVIDER_SITE_OTHER): Payer: BC Managed Care – PPO | Admitting: Family Medicine

## 2020-09-20 VITALS — BP 140/80 | HR 88 | Temp 97.7°F | Ht 71.5 in | Wt 299.0 lb

## 2020-09-20 DIAGNOSIS — J309 Allergic rhinitis, unspecified: Secondary | ICD-10-CM

## 2020-09-20 DIAGNOSIS — R053 Chronic cough: Secondary | ICD-10-CM

## 2020-09-20 DIAGNOSIS — Z0000 Encounter for general adult medical examination without abnormal findings: Secondary | ICD-10-CM | POA: Diagnosis not present

## 2020-09-20 DIAGNOSIS — F909 Attention-deficit hyperactivity disorder, unspecified type: Secondary | ICD-10-CM

## 2020-09-20 DIAGNOSIS — E785 Hyperlipidemia, unspecified: Secondary | ICD-10-CM

## 2020-09-20 MED ORDER — FISH OIL 1000 MG PO CAPS: 1.0000 | ORAL_CAPSULE | Freq: Every day | ORAL | Status: AC

## 2020-09-20 MED ORDER — SAXENDA 18 MG/3ML ~~LOC~~ SOPN: PEN_INJECTOR | SUBCUTANEOUS | 1 refills | Status: DC

## 2020-09-20 MED ORDER — AMPHETAMINE-DEXTROAMPHET ER 25 MG PO CP24: 25.0000 mg | ORAL_CAPSULE | ORAL | 0 refills | Status: DC

## 2020-09-20 NOTE — Assessment & Plan Note (Signed)
Discussed healthy diet and lifestyle choices for goal sustainable weight loss.  He is interested in discussion of bariatric medication. Reviewed options including mechanism of action as well as side effects and pros/cons of each (phentermine, contrave, and saxenda/wegovy). Avoid phentermine with concomitant ADHD stimulant use. He declines contrave (possible poor reaction to wellbutrin in the past).  Interested in [place]. Reviewed mechanism of action as well as possible side effects. Reviewed titration schedule for starting. No personal or fmhx MTC. He will price out and let us know if unaffordable. He will schedule 4-6 wk f/u visit after starting medication.

## 2020-09-20 NOTE — Progress Notes (Signed)
Patient ID: Nathan Camacho, male    DOB: [birth year], 55 y.o.   MRN: [id number]  This visit was conducted in person.  BP 140/80   Pulse 88   Temp 97.7 F (36.5 C) (Temporal)   Ht 5' 11.5" (1.816 m)   Wt 299 lb (135.6 kg)   SpO2 99%   BMI 41.12 kg/m   BP Readings from Last 3 Encounters:  09/20/20 140/80  08/17/19 122/84  06/03/19 (!) 118/52    CC: CPE  Subjective:   HPI: Nathan Camacho is a 55 y.o. male presenting on 09/20/2020 for Annual Exam   ADD - on adderall 20mg  XR daily 6am.  Takes aspirin daily - encouraged he stop this.  Obesity - ongoing weight gain noted. New job limits time available for regular physical activity. He was previously on wellbutrin and felt zombie feeling. Has not been on weight loss meds in the past.   No fmhx medullary thyroid cancer  Preventative: COLONOSCOPY 05/2019 - TAx2, diverticulosis, rpt 5 yrs (Nandigam)  Prostate cancer screening - baseline DRE/PSA normal, rpt yearly PSA  Lung cancer screening - not eligible  Flu shotyearly  [place] 11/2019, 12/2019, 06/2020  Td 2008. Tdap12/2018  Zostavax - 08/2019, 01/2020 Seat belt use discussed Sunscreen use discussed. No changing moles on skin.  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 Interim medical history since our last visit reviewed. Allergies and medications reviewed and updated. Outpatient Medications Prior to Visit  Medication Sig Dispense Refill  . fexofenadine (ALLEGRA) 180 MG tablet Take 1 tablet (180 mg total) by mouth daily. 30 tablet 11  .  fluticasone (FLONASE) 50 MCG/ACT nasal spray Place 2 sprays into both nostrils daily. 16 g 11  . Glucosamine-Chondroit-Vit C-Mn (GLUCOSAMINE 1500 COMPLEX) CAPS daily.    . Multiple Vitamin (MULTIVITAMIN) tablet Take 1 tablet by mouth daily.    [place] Kitchen amphetamine-dextroamphetamine (ADDERALL XR) 25 MG 24 hr capsule Take 1 capsule by mouth every morning. 30 capsule 0  . aspirin 81 MG tablet Take 81 mg by mouth daily.    . Omega-3 Fatty Acids (FISH OIL) 1000 MG CAPS daily.     No facility-administered medications prior to visit.     Per HPI unless specifically indicated in ROS section below Review of Systems  Constitutional: Negative for activity change, appetite change, chills, fatigue, fever and unexpected weight change.  HENT: Negative for hearing loss.   Eyes: Negative for visual disturbance.  Respiratory: Positive for cough (dry, lingering for 1-2 months). Negative for chest tightness, shortness of breath and wheezing.   Cardiovascular: Negative for chest pain, palpitations and leg swelling.  Gastrointestinal: Negative for abdominal distention, abdominal pain, blood in stool, constipation, diarrhea, nausea and vomiting.  Genitourinary: Negative for difficulty urinating and hematuria.  Musculoskeletal: Negative for arthralgias, myalgias and neck pain.  Skin: Negative for rash.  Neurological: Negative for dizziness, seizures, syncope and headaches.  Hematological: Negative for adenopathy. Does not bruise/bleed easily.  Psychiatric/Behavioral: Negative for dysphoric mood. The patient is not nervous/anxious.    Objective:  BP 140/80   Pulse 88   Temp 97.7 F (  36.5 C) (Temporal)   Ht 5' 11.5" (1.816 m)   Wt 299 lb (135.6 kg)   SpO2 99%   BMI 41.12 kg/m   Wt Readings from Last 3 Encounters:  09/20/20 299 lb (135.6 kg)  08/17/19 294 lb 7 oz (133.6 kg)  06/03/19 295 lb (133.8 kg)      Physical Exam Vitals and nursing note reviewed.  Constitutional:      General: He is not in acute  distress.    Appearance: Normal appearance. He is well-developed and well-nourished. He is obese. He is not ill-appearing.  HENT:     Head: Normocephalic and atraumatic.     Right Ear: Hearing, tympanic membrane, ear canal and external ear normal.     Left Ear: Hearing, tympanic membrane, ear canal and external ear normal.     Mouth/Throat:     Mouth: Oropharynx is clear and moist and mucous membranes are normal.     Pharynx: No posterior oropharyngeal edema.  Eyes:     General: No scleral icterus.    Extraocular Movements: Extraocular movements intact and EOM normal.     Conjunctiva/sclera: Conjunctivae normal.     Pupils: Pupils are equal, round, and reactive to light.  Neck:     Thyroid: No thyroid mass or thyromegaly.  Cardiovascular:     Rate and Rhythm: Normal rate and regular rhythm.     Pulses: Normal pulses and intact distal pulses.          Radial pulses are 2+ on the right side and 2+ on the left side.     Heart sounds: Normal heart sounds. No murmur heard.   Pulmonary:     Effort: Pulmonary effort is normal. No respiratory distress.     Breath sounds: Normal breath sounds. No wheezing, rhonchi or rales.     Comments: Small lipoma to left anterior chest wall Abdominal:     General: Abdomen is flat. Bowel sounds are normal. There is no distension.     Palpations: Abdomen is soft. There is no mass.     Tenderness: There is no abdominal tenderness. There is no guarding or rebound.     Hernia: No hernia is present.  Musculoskeletal:        General: No edema. Normal range of motion.     Cervical back: Normal range of motion and neck supple.     Right lower leg: No edema.     Left lower leg: No edema.  Lymphadenopathy:     Cervical: No cervical adenopathy.  Skin:    General: Skin is warm and dry.     Findings: No rash.  Neurological:     General: No focal deficit present.     Mental Status: He is alert and oriented to person, place, and time.     Comments: CN grossly  intact, station and gait intact  Psychiatric:        Mood and Affect: Mood and affect and mood normal.        Behavior: Behavior normal.        Thought Content: Thought content normal.        Judgment: Judgment normal.       Results for orders placed or performed in visit on 09/08/20  Hepatitis C antibody  Result Value Ref Range   Hepatitis C Ab NON-REACTIVE NON-REACTI   SIGNAL TO CUT-OFF 0.00 <1.00  PSA  Result Value Ref Range   PSA 0.47 0.10 - 4.00 ng/mL  Hemoglobin A1c  Result  Value Ref Range   Hgb A1c MFr Bld 5.6 4.6 - 6.5 %  Comprehensive metabolic panel  Result Value Ref Range   Sodium 138 135 - 145 mEq/L   Potassium 4.9 3.5 - 5.1 mEq/L   Chloride 104 96 - 112 mEq/L   CO2 30 19 - 32 mEq/L   Glucose, Bld 95 70 - 99 mg/dL   BUN 19 6 - 23 mg/dL   Creatinine, Ser 1.06 0.40 - 1.50 mg/dL   Total Bilirubin 0.6 0.2 - 1.2 mg/dL   Alkaline Phosphatase 84 39 - 117 U/L   AST 16 0 - 37 U/L   ALT 27 0 - 53 U/L   Total Protein 6.8 6.0 - 8.3 g/dL   Albumin 4.4 3.5 - 5.2 g/dL   GFR 80.54 >60.00 mL/min   Calcium 9.6 8.4 - 10.5 mg/dL  Lipid panel  Result Value Ref Range   Cholesterol 186 0 - 200 mg/dL   Triglycerides 95.0 0.0 - 149.0 mg/dL   HDL 49.40 >39.00 mg/dL   VLDL 19.0 0.0 - 40.0 mg/dL   LDL Cholesterol 117 (H) 0 - 99 mg/dL   Total CHOL/HDL Ratio 4    NonHDL 136.24    Assessment & Plan:  This visit occurred during the SARS-CoV-2 public health emergency.  Safety protocols were in place, including screening questions prior to the visit, additional usage of staff PPE, and extensive cleaning of exam room while observing appropriate contact time as indicated for disinfecting solutions.   Problem List Items Addressed This Visit    Persistent dry cough    Ongoing since URI (?COVID) last month - ?hidden reflux GERD component - suggested daily pepcid or prilosec x 2-3 wks.       Obesity, morbid, BMI 40.0-49.9 ([place])    Discussed healthy diet and lifestyle choices for goal  sustainable weight loss.  He is interested in discussion of bariatric medication. Reviewed options including mechanism of action as well as side effects and pros/cons of each (phentermine, contrave, and saxenda/wegovy). Avoid phentermine with concomitant ADHD stimulant use. He declines contrave (possible poor reaction to wellbutrin in the past).  Interested in [place]. Reviewed mechanism of action as well as possible side effects. Reviewed titration schedule for starting. No personal or fmhx MTC. He will price out and let us know if unaffordable. He will schedule 4-6 wk f/u visit after starting medication.       Relevant Medications   amphetamine-dextroamphetamine (ADDERALL XR) 25 MG 24 hr capsule   Liraglutide -Weight Management (SAXENDA) 18 MG/3ML SOPN   Health maintenance examination - Primary    Preventative protocols reviewed and updated unless pt declined. Discussed healthy diet and lifestyle.       Dyslipidemia    Chronic, stable, actually improved over the past few years without cause for improvement. Continue low dose fish oil. The 10-year ASCVD risk score Mikey Bussing DC Brooke Bonito., et al., 2013) is: 4.5%   Values used to calculate the score:     Age: 31 years     Sex: Male     Is Non-Hispanic African American: No     Diabetic: No     Tobacco smoker: No     Systolic Blood Pressure: 062 mmHg     Is BP treated: No     HDL Cholesterol: 49.4 mg/dL     Total Cholesterol: 186 mg/dL       Allergic rhinitis    Chronic, on daily allegra and flonase.       Adult ADHD  Chronic, stable period on adderall XL 20mg  daily - continue. Tolerating well.  Will be due for UDS next visit.           Meds ordered this encounter  Medications  . amphetamine-dextroamphetamine (ADDERALL XR) 25 MG 24 hr capsule    Sig: Take 1 capsule by mouth every morning.    Dispense:  30 capsule    Refill:  0  . Omega-3 Fatty Acids (FISH OIL) 1000 MG CAPS    Sig: Take 1 capsule (1,000 mg total) by mouth daily.  .  Liraglutide -Weight Management (SAXENDA) 18 MG/3ML SOPN    Sig: Inject 0.6 mg into the skin daily for 7 days, THEN 1.2 mg daily for 7 days, THEN 1.8 mg daily for 7 days, THEN 2.4 mg daily for 7 days, THEN 3 mg daily.    Dispense:  9 mL    Refill:  1   No orders of the defined types were placed in this encounter.   Patient instructions: You are doing well today  Consider nightly pepcid or daily prilosec otc to see if this helps dry cough.  Price out saxenda daily injectable weight loss medicine - tapering dose 0.6mg  increasing every week. Stop at 1.8mg  dose until follow up. Return in 4-6 weeks for follow up.  Good to see you today  Follow up plan: Return in about 6 weeks (around 11/01/2020) for follow up visit.  Ria Bush, MD

## 2020-09-20 NOTE — Assessment & Plan Note (Signed)
Chronic, on daily allegra and flonase.

## 2020-09-20 NOTE — Assessment & Plan Note (Signed)
Chronic, stable, actually improved over the past few years without cause for improvement. Continue low dose fish oil. The 10-year ASCVD risk score Mikey Bussing DC Brooke Bonito., et al., 2013) is: 4.5%   Values used to calculate the score:     Age: 55 years     Sex: Male     Is Non-Hispanic African American: No     Diabetic: No     Tobacco smoker: No     Systolic Blood Pressure: 263 mmHg     Is BP treated: No     HDL Cholesterol: 49.4 mg/dL     Total Cholesterol: 186 mg/dL

## 2020-09-20 NOTE — Assessment & Plan Note (Addendum)
Chronic, stable period on adderall XL 20mg  daily - continue. Tolerating well.  Will be due for UDS next visit.

## 2020-09-20 NOTE — Assessment & Plan Note (Signed)
Ongoing since URI (?COVID) last month - ?hidden reflux GERD component - suggested daily pepcid or prilosec x 2-3 wks.

## 2020-09-20 NOTE — Assessment & Plan Note (Signed)
Preventative protocols reviewed and updated unless pt declined. Discussed healthy diet and lifestyle.  

## 2020-09-20 NOTE — Patient Instructions (Addendum)
You are doing well today  Consider nightly pepcid or daily prilosec otc to see if this helps dry cough.  Price out saxenda daily injectable weight loss medicine - tapering dose 0.6mg  increasing every week. Stop at 1.8mg  dose until follow up. Return in 4-6 weeks for follow up.  Good to see you today  Health Maintenance, Male Adopting a healthy lifestyle and getting preventive care are important in promoting health and wellness. Ask your health care provider about:  The right schedule for you to have regular tests and exams.  Things you can do on your own to prevent diseases and keep yourself healthy. What should I know about diet, weight, and exercise? Eat a healthy diet  Eat a diet that includes plenty of vegetables, fruits, low-fat dairy products, and lean protein.  Do not eat a lot of foods that are high in solid fats, added sugars, or sodium.   Maintain a healthy weight Body mass index (BMI) is a measurement that can be used to identify possible weight problems. It estimates body fat based on height and weight. Your health care provider can help determine your BMI and help you achieve or maintain a healthy weight. Get regular exercise Get regular exercise. This is one of the most important things you can do for your health. Most adults should:  Exercise for at least 150 minutes each week. The exercise should increase your heart rate and make you sweat (moderate-intensity exercise).  Do strengthening exercises at least twice a week. This is in addition to the moderate-intensity exercise.  Spend less time sitting. Even light physical activity can be beneficial. Watch cholesterol and blood lipids Have your blood tested for lipids and cholesterol at 53 years of age, then have this test every 5 years. You may need to have your cholesterol levels checked more often if:  Your lipid or cholesterol levels are high.  You are older than 53 years of age.  You are at high risk for heart  disease. What should I know about cancer screening? Many types of cancers can be detected early and may often be prevented. Depending on your health history and family history, you may need to have cancer screening at various ages. This may include screening for:  Colorectal cancer.  Prostate cancer.  Skin cancer.  Lung cancer. What should I know about heart disease, diabetes, and high blood pressure? Blood pressure and heart disease  High blood pressure causes heart disease and increases the risk of stroke. This is more likely to develop in people who have high blood pressure readings, are of African descent, or are overweight.  Talk with your health care provider about your target blood pressure readings.  Have your blood pressure checked: ? Every 3-5 years if you are 23-53 years of age. ? Every year if you are 74 years old or older.  If you are between the ages of 85 and 32 and are a current or former smoker, ask your health care provider if you should have a one-time screening for abdominal aortic aneurysm (AAA). Diabetes Have regular diabetes screenings. This checks your fasting blood sugar level. Have the screening done:  Once every three years after age 48 if you are at a normal weight and have a low risk for diabetes.  More often and at a younger age if you are overweight or have a high risk for diabetes. What should I know about preventing infection? Hepatitis B If you have a higher risk for hepatitis B, you  should be screened for this virus. Talk with your health care provider to find out if you are at risk for hepatitis B infection. Hepatitis C Blood testing is recommended for:  Everyone born from [birth year] through 1963.  Anyone with known risk factors for hepatitis C. Sexually transmitted infections (STIs)  You should be screened each year for STIs, including gonorrhea and chlamydia, if: ? You are sexually active and are younger than 53 years of age. ? You are older  than 53 years of age and your health care provider tells you that you are at risk for this type of infection. ? Your sexual activity has changed since you were last screened, and you are at increased risk for chlamydia or gonorrhea. Ask your health care provider if you are at risk.  Ask your health care provider about whether you are at high risk for HIV. Your health care provider may recommend a prescription medicine to help prevent HIV infection. If you choose to take medicine to prevent HIV, you should first get tested for HIV. You should then be tested every 3 months for as long as you are taking the medicine. Follow these instructions at home: Lifestyle  Do not use any products that contain nicotine or tobacco, such as cigarettes, e-cigarettes, and chewing tobacco. If you need help quitting, ask your health care provider.  Do not use street drugs.  Do not share needles.  Ask your health care provider for help if you need support or information about quitting drugs. Alcohol use  Do not drink alcohol if your health care provider tells you not to drink.  If you drink alcohol: ? Limit how much you have to 0-2 drinks a day. ? Be aware of how much alcohol is in your drink. In the [place] one drink equals one 12 oz bottle of beer (355 mL), one 5 oz glass of wine (148 mL), or one 1 oz glass of hard liquor (44 mL). General instructions  Schedule regular health, dental, and eye exams.  Stay current with your vaccines.  Tell your health care provider if: ? You often feel depressed. ? You have ever been abused or do not feel safe at home. Summary  Adopting a healthy lifestyle and getting preventive care are important in promoting health and wellness.  Follow your health care provider's instructions about healthy diet, exercising, and getting tested or screened for diseases.  Follow your health care provider's instructions on monitoring your cholesterol and blood pressure. This  information is not intended to replace advice given to you by your health care provider. Make sure you discuss any questions you have with your health care provider. Document Revised: 07/16/2018 Document Reviewed: 07/16/2018 Elsevier Patient Education  2021 [employer].

## 2020-09-23 ENCOUNTER — Telehealth: Payer: Self-pay

## 2020-09-23 NOTE — Telephone Encounter (Signed)
Received faxed PA for Saxenda 18 mg/5mL from [employer].  Submitted PA- key:  [id number].  Decision pending.

## 2020-09-26 ENCOUNTER — Emergency Department (HOSPITAL_COMMUNITY)
Admission: EM | Admit: 2020-09-26 | Discharge: 2020-09-27 | Disposition: A | Discharge status: Home / Self Care | Payer: BC Managed Care – PPO | Attending: Emergency Medicine | Admitting: Emergency Medicine

## 2020-09-26 ENCOUNTER — Other Ambulatory Visit: Payer: Self-pay

## 2020-09-26 ENCOUNTER — Encounter (HOSPITAL_COMMUNITY): Payer: Self-pay

## 2020-09-26 ENCOUNTER — Telehealth: Payer: Self-pay

## 2020-09-26 ENCOUNTER — Emergency Department (HOSPITAL_COMMUNITY): Payer: BC Managed Care – PPO

## 2020-09-26 DIAGNOSIS — W108XXA Fall (on) (from) other stairs and steps, initial encounter: Secondary | ICD-10-CM | POA: Diagnosis not present

## 2020-09-26 DIAGNOSIS — Z5321 Procedure and treatment not carried out due to patient leaving prior to being seen by health care provider: Secondary | ICD-10-CM | POA: Insufficient documentation

## 2020-09-26 DIAGNOSIS — R2241 Localized swelling, mass and lump, right lower limb: Secondary | ICD-10-CM | POA: Diagnosis not present

## 2020-09-26 MED ORDER — BD PEN NEEDLE SHORT U/F 31G X 8 MM MISC: 0 refills | Status: DC

## 2020-09-26 NOTE — Telephone Encounter (Signed)
Received faxed new rx request from [facility] for BD UF Short pen needle 8 mm X 31 G.   E-scribed rx.

## 2020-09-26 NOTE — ED Triage Notes (Signed)
Pt states the tripped going down the steps and hurt his R second toe, minimal swelling

## 2020-09-26 NOTE — Telephone Encounter (Signed)
Yes.  Submitted on 09/23/20.  Waiting for decision.

## 2020-09-26 NOTE — Telephone Encounter (Signed)
Did you get any request for PA? I haven't received anything.

## 2020-09-27 ENCOUNTER — Telehealth: Payer: Self-pay | Admitting: Family Medicine

## 2020-09-27 ENCOUNTER — Encounter (HOSPITAL_COMMUNITY): Payer: Self-pay

## 2020-09-27 ENCOUNTER — Ambulatory Visit (INDEPENDENT_AMBULATORY_CARE_PROVIDER_SITE_OTHER): Payer: BC Managed Care – PPO

## 2020-09-27 ENCOUNTER — Ambulatory Visit (HOSPITAL_COMMUNITY)
Admission: EM | Admit: 2020-09-27 | Discharge: 2020-09-27 | Disposition: A | Discharge status: Home / Self Care | Payer: BC Managed Care – PPO | Attending: Family Medicine | Admitting: Family Medicine

## 2020-09-27 ENCOUNTER — Other Ambulatory Visit: Payer: Self-pay

## 2020-09-27 DIAGNOSIS — S93104A Unspecified dislocation of right toe(s), initial encounter: Secondary | ICD-10-CM | POA: Diagnosis not present

## 2020-09-27 DIAGNOSIS — S93124A Dislocation of metatarsophalangeal joint of right lesser toe(s), initial encounter: Secondary | ICD-10-CM

## 2020-09-27 DIAGNOSIS — S92501A Displaced unspecified fracture of right lesser toe(s), initial encounter for closed fracture: Secondary | ICD-10-CM

## 2020-09-27 MED ORDER — LIDOCAINE HCL (PF) 1 % IJ SOLN
INTRAMUSCULAR | Status: AC
  Filled 2020-09-27: qty 30

## 2020-09-27 NOTE — Discharge Instructions (Signed)
If not allergic, you may use over the counter ibuprofen or acetaminophen as needed. ° °

## 2020-09-27 NOTE — Telephone Encounter (Signed)
Left message on vm per dpr relaying Dr. Synthia Innocent message.  Asked pt to call back letting us know if he has been evaluated or not for the toe injury.

## 2020-09-27 NOTE — Telephone Encounter (Signed)
Received note from ER pt seen at [facility] last night, left without being seen.  He did have dislocation of toe as well as fracture. plz ensure he was evaluated for this, would rec podiatry or ortho referral if hasn't been seen yet. Let me know.   IMPRESSION: Dorsolateral dislocation of the second digit at the level of the proximal interphalangeal joint Additional tiny ossific fragments towards the head of the middle phalanx, concerning for displaced fracture fragments, donor site unclear. Electronically Signed By: Lovena Le M.D. On: 09/26/2020 22:41

## 2020-09-27 NOTE — ED Notes (Signed)
Pt leaving due to wait time.

## 2020-09-27 NOTE — ED Triage Notes (Signed)
Pt reports falling from stairs down to the basement last night. He states he broke his toe. Pt reports going to ED and getting X-RAYS done. He states he was not seen by a Medical Provider at the ED. Pt states he has kept his toe iced and elevated since last night.

## 2020-09-27 NOTE — ED Provider Notes (Addendum)
[place]   [id number] 09/27/20 Arrival Time: 0907  ASSESSMENT & PLAN:  1. Closed dislocation of second toe of right foot, initial encounter   2. Closed fracture of phalanx of right second toe, initial encounter     I have personally viewed the imaging studies ordered this visit. Post-reduction: toe in place.  Procedure: Reduction of dislocated toe. Skin around R 2nd toe cleaned and prepped. Digital block performed with 1% lidocaine. Reduction accomplished with traction and pressure on toe. No complications. Pt tolerated well.   Reports that he has podiatry f/u tomorrow. Placed in post-op shoe here. OTC ibuprofen as needed.   Future Appointments  Encounter Information   Provider [place]  09/28/2020 2:15 PM Garrel Ridgel, [place] [facility] at [place] expectations re: course of current medical issues. Questions answered. Outlined signs and symptoms indicating need for more acute intervention. Patient verbalized understanding. After Visit Summary given.  SUBJECTIVE: History from: patient. Nathan Camacho is a 55 y.o. male who reports falling from stair 2 d ago; feels he broke toe; painful with some swelling; in ED last evening; LWBS. X-rays reviewed by me show dislocation and likely fx. Able to bear wt with pain. No extremity sensation changes or weakness.  Ice to toe has helped.  Past Surgical History:  Procedure Laterality Date  . COLONOSCOPY  05/2019   TAx2, diverticulosis, rpt 5 yrs (Nandigam)  . MENISCUS REPAIR Left 01/2019   [facility]  . WISDOM TOOTH EXTRACTION     not sedated       OBJECTIVE:  Vitals:   09/27/20 0956  BP: 135/85  Pulse: 71  Resp: 18  Temp: 98 F (36.7 C)  TempSrc: Temporal  SpO2: 98%    General appearance: alert; no distress HEENT: [place]; AT Neck: supple with FROM Resp: unlabored respirations Extremities: . RLE: warm with well perfused appearance; well localized moderate  tenderness over right second toe; with dorsal tending of proximal skin; swelling: minimal; bruising: minimal; 2nd toe ROM: limited by reported pain CV: brisk extremity capillary refill of RLE; 2+ DP pulse of RLE. Normal capillary refill of R 2nd toe. Skin: warm and dry; no visible rashes Neurologic: normal sensation and strength of RLE Psychological: alert and cooperative; normal mood and affect  Imaging: DG Toe 2nd Right  Result Date: 09/27/2020 CLINICAL DATA:  Dislocation status post reduction film EXAM: RIGHT SECOND TOE COMPARISON:  Toe radiograph September 26, 2020 FINDINGS: Interval reduction with satisfactory alignment of the second digit at the level of the proximal interphalangeal joint. Cortical irregularity with a small ossific fragment along the dorsal and distal aspect the middle phalanx. IMPRESSION: Interval reduction of the second digit at the level of the proximal interphalangeal joint. Cortical irregularity with a small ossific fragment along the dorsal and distal aspect the middle phalanx, concerning for displaced fracture fragment. Electronically Signed   By: Dahlia Bailiff MD   On: 09/27/2020 11:21   DG Toe 2nd Right  Result Date: 09/26/2020 CLINICAL DATA:  Swelling of the second digit, slipped EXAM: RIGHT SECOND TOE COMPARISON:  None. FINDINGS: Dorsal lateral dislocation of the second digit at the level of the proximal interphalangeal joint. Some tiny ossific fragments are also noted towards the head of the middle phalanx as well which could reflect displaced fracture fragments. Circumferential swelling of the digit. No soft tissue gas or foreign body. No other acute or conspicuous osseous or soft tissue abnormalities. IMPRESSION: Dorsolateral dislocation of the  second digit at the level of the proximal interphalangeal joint Additional tiny ossific fragments towards the head of the middle phalanx, concerning for displaced fracture fragments, donor site unclear. Electronically Signed    By: Lovena Le M.D.   On: 09/26/2020 22:41      Allergies  Allergen Reactions  . Bee Venom     Past Medical History:  Diagnosis Date  . Adult ADHD 08/24/2014   S/p eval by our psychologist Dr Lurline Hare - consistent with ADHD but mild case currently.    . Allergy    Social History   Socioeconomic History  . Marital status: Married    Spouse name: Not on file  . Number of children: Not on file  . Years of education: Not on file  . Highest education level: Not on file  Occupational History  . Not on file  Tobacco Use  . Smoking status: Former Smoker    Types: Cigarettes    Quit date: 12/17/1993    Years since quitting: 26.7  . Smokeless tobacco: Never Used  Vaping Use  . Vaping Use: Never used  Substance and Sexual Activity  . Alcohol use: Yes    Comment: social   . Drug use: No  . Sexual activity: Not on file  Other Topics Concern  . 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 Crohn's disease Mother   . Atrial fibrillation Mother   . Hypertension Mother   . COPD Father        smoker  . Cancer Father 97       anal  . Rectal cancer Father 78  . Cancer Paternal Grandfather        stomach  . Stomach cancer Paternal Grandfather   . Colon polyps Neg Hx   . Colon cancer Neg Hx   . Esophageal cancer Neg Hx    Past Surgical History:  Procedure Laterality Date  . COLONOSCOPY  05/2019   TAx2, diverticulosis, rpt 5 yrs (Nandigam)  . MENISCUS REPAIR Left 01/2019   [facility]  . WISDOM TOOTH EXTRACTION     not sedated        Vanessa Kick, MD 09/27/20 1126    Vanessa Kick, MD 09/27/20 1130

## 2020-09-28 ENCOUNTER — Telehealth: Payer: Self-pay | Admitting: Family Medicine

## 2020-09-28 ENCOUNTER — Encounter: Payer: Self-pay | Admitting: Podiatry

## 2020-09-28 ENCOUNTER — Ambulatory Visit: Payer: BC Managed Care – PPO | Admitting: Podiatry

## 2020-09-28 ENCOUNTER — Other Ambulatory Visit: Payer: Self-pay

## 2020-09-28 DIAGNOSIS — S93104D Unspecified dislocation of right toe(s), subsequent encounter: Secondary | ICD-10-CM | POA: Diagnosis not present

## 2020-09-28 NOTE — Telephone Encounter (Addendum)
Pt returning call.  States he got my message.  Says toe was pulled back into place at [facility] but after that he waited so long to be seen and left.  Treated area with ice at home.  Pt has appt today at [place] with Dr. Milinda Pointer.

## 2020-09-28 NOTE — Progress Notes (Signed)
Subjective:  Patient ID: Nathan Camacho, male    DOB: [birth year],  MRN: [id number] HPI Chief Complaint  Patient presents with  . Toe Injury    Patient presents today for right 2nd toe injury 3 days ago.  He states "I was coming down my stairs with the laundry basket and slipped, fell and hit my toe.  I went to [facility] ED and they reset it.  It doesn't hurt much, only when I move it a lot"   He was given a post op shoe and has been keeping eleveted    55 y.o. male presents with the above complaint.   ROS: Denies fever chills nausea vomiting muscle aches pains calf pain back pain chest pain shortness of breath.  Past Medical History:  Diagnosis Date  . Adult ADHD 08/24/2014   S/p eval by our psychologist Dr Lurline Hare - consistent with ADHD but mild case currently.    . Allergy    Past Surgical History:  Procedure Laterality Date  . COLONOSCOPY  05/2019   TAx2, diverticulosis, rpt 5 yrs (Nandigam)  . MENISCUS REPAIR Left 01/2019   [facility]  . WISDOM TOOTH EXTRACTION     not sedated     Current Outpatient Medications:  .  amphetamine-dextroamphetamine (ADDERALL XR) 25 MG 24 hr capsule, Take 1 capsule by mouth every morning., Disp: 30 capsule, Rfl: 0 .  fexofenadine (ALLEGRA) 180 MG tablet, Take 1 tablet (180 mg total) by mouth daily., Disp: 30 tablet, Rfl: 11 .  fluticasone (FLONASE) 50 MCG/ACT nasal spray, Place 2 sprays into both nostrils daily., Disp: 16 g, Rfl: 11 .  Glucosamine-Chondroit-Vit C-Mn (GLUCOSAMINE 1500 COMPLEX) CAPS, daily., Disp: , Rfl:  .  Insulin Pen Needle (B-D ULTRAFINE III SHORT PEN) 31G X 8 MM MISC, Use as instructed to administer medication daily., Disp: 100 each, Rfl: 0 .  Liraglutide -Weight Management (SAXENDA) 18 MG/3ML SOPN, Inject 0.6 mg into the skin daily for 7 days, THEN 1.2 mg daily for 7 days, THEN 1.8 mg daily for 7 days, THEN 2.4 mg daily for 7 days, THEN 3 mg daily., Disp: 9 mL, Rfl: 1 .  Multiple Vitamin (MULTIVITAMIN) tablet, Take 1 tablet by mouth  daily., Disp: , Rfl:  .  Omega-3 Fatty Acids (FISH OIL) 1000 MG CAPS, Take 1 capsule (1,000 mg total) by mouth daily., Disp: , Rfl:   Allergies  Allergen Reactions  . Bee Venom    Review of Systems Objective:  There were no vitals filed for this visit.  General: Well developed, nourished, in no acute distress, alert and oriented x3   Dermatological: Skin is warm, dry and supple bilateral. Nails x 10 are well maintained; remaining integument appears unremarkable at this time. There are no open sores, no preulcerative lesions, no rash or signs of infection present.  Vascular: Dorsalis Pedis artery and Posterior Tibial artery pedal pulses are 2/4 bilateral with immedate capillary fill time. Pedal hair growth present. No varicosities and no lower extremity edema present bilateral.   Neruologic: Grossly intact via light touch bilateral. Vibratory intact via tuning fork bilateral. Protective threshold with Semmes Wienstein monofilament intact to all pedal sites bilateral. Patellar and Achilles deep tendon reflexes 2+ bilateral. No Babinski or clonus noted bilateral.   Musculoskeletal: No gross boney pedal deformities bilateral. No pain, crepitus, or limitation noted with foot and ankle range of motion bilateral. Muscular strength 5/5 in all groups tested bilateral.  Mild edema of the second toe with some flexor contracture.  He does have  some extensor capability so I do not think that it tore the extensor.  Gait: Unassisted, Nonantalgic.    Radiographs:  Evaluated the dislocation and the reduction radiographs of the second digit of the right foot.  It looks very good and aligns perfectly.  Assessment & Plan:   Assessment: Dislocated second toe at the PIPJ secondary to trauma currently reduced looking good.  Plan: I showed him how to wrap the toe with Cob[place] today and he will continue use of the Darco shoe for least another 4 to 6 weeks.  Should this dislocate again he will notify us and we  will go to surgery.     Nathan Camacho T[place], [place]

## 2020-09-28 NOTE — Telephone Encounter (Signed)
Noted! Thank you

## 2020-09-28 NOTE — Telephone Encounter (Signed)
Open in error

## 2020-09-30 ENCOUNTER — Encounter: Payer: Self-pay | Admitting: Podiatry

## 2020-09-30 ENCOUNTER — Encounter: Payer: Self-pay | Admitting: Family Medicine

## 2020-09-30 NOTE — Telephone Encounter (Signed)
Received faxed PA approval, valid 09/23/2020- 01/21/2021.

## 2020-10-03 ENCOUNTER — Encounter: Payer: Self-pay | Admitting: Podiatry

## 2020-10-12 ENCOUNTER — Telehealth: Payer: Self-pay | Admitting: *Deleted

## 2020-10-12 DIAGNOSIS — M79676 Pain in unspecified toe(s): Secondary | ICD-10-CM

## 2020-10-12 NOTE — Telephone Encounter (Signed)
I'm calling to let you know that your form is ready.  Who do I send it to?  "You can email it to my HR."  It's easier for me to fax it.  "Let me call you back tomorrow.  I'm going to call HR and get the fax number.  Is that okay?"  Yes, that is fine.

## 2020-10-14 NOTE — Telephone Encounter (Signed)
Mr. Rockwell Germany called and requested the disability form to be faxed to the attention of Edwina Barth at [phone].

## 2020-10-17 ENCOUNTER — Other Ambulatory Visit: Payer: Self-pay | Admitting: Family Medicine

## 2020-10-17 NOTE — Telephone Encounter (Signed)
Refill request for Adderall XR 25 mg capsules  LOV - 09/20/20 Next OV - 10/25/20 Last refill - 09/20/20 #30/0

## 2020-10-21 MED ORDER — AMPHETAMINE-DEXTROAMPHET ER 25 MG PO CP24: 25.0000 mg | ORAL_CAPSULE | ORAL | 0 refills | Status: DC

## 2020-10-21 NOTE — Telephone Encounter (Signed)
ERx 

## 2020-10-24 ENCOUNTER — Other Ambulatory Visit: Payer: Self-pay

## 2020-10-25 ENCOUNTER — Encounter: Payer: Self-pay | Admitting: Family Medicine

## 2020-10-25 ENCOUNTER — Ambulatory Visit: Payer: BC Managed Care – PPO | Admitting: Family Medicine

## 2020-10-25 MED ORDER — SAXENDA 18 MG/3ML ~~LOC~~ SOPN: 3.0000 mg | PEN_INJECTOR | Freq: Every day | SUBCUTANEOUS | 0 refills | Status: AC

## 2020-10-25 NOTE — Patient Instructions (Signed)
Continue 2.4-3mg  daily injection of saxenda depending on nausea.  You are doing great!  Return in 4-6 weeks for follow up visit.

## 2020-10-25 NOTE — Assessment & Plan Note (Addendum)
Congratulated on 4 % body weight loss to date on Saxenda.  Already note marked improvement in blood pressure control.  Discussed Saxenda use, to drop to 2.4mg  daily if unable to tolerate 3mg  dose due to nausea.  Reviewed 24 hour food recall.  Questions answered.  RTC 4-6 wks f/u visit.  Pt agrees with plan.

## 2020-10-25 NOTE — Progress Notes (Signed)
Patient ID: Nathan Camacho, male    DOB: 1966-08-03, 55 y.o.   MRN: [id number]  This visit was conducted in person.  BP 108/76 (BP Location: Left Arm, Patient Position: Sitting, Cuff Size: Large)   Pulse 71   Temp (!) 97.3 F (36.3 C) (Temporal)   Ht 5' 11.5" (1.816 m)   Wt 287 lb (130.2 kg)   SpO2 97%   BMI 39.47 kg/m   BP Readings from Last 3 Encounters:  10/25/20 108/76  09/27/20 135/85  09/26/20 127/84    CC: 4-6 wk obesity f/u visit  Subjective:   HPI: Nathan Camacho is a 55 y.o. male presenting on 10/25/2020 for Follow-up (On medication )   Starting weight: 299 lbs Last weight: same  Today's weight 287 lbs (4% weight loss)   Last visit we started Saxenda daily - has titrated up to 3mg  daily.and tolerating fine. No GI upset, constipation. Notes some nausea after taking injection.   24 hour recall: 10am Breakfast - 1/2 cup oatmeal + handful of frozen berries, drank 3-4 cups black coffee  Skipped lunch  6pm Dinner - 2 steak fajitas (tortilla, onions, mushrooms, cheese), 7-up.  Sometimes eats nutbar as snack between meals, but not yesterday.   Activity regimen: Limited by foot (dislocated toe).    Since seen, dislocated great toe after injury at home. Seeing Dr Milinda Pointer.      Relevant past medical, surgical, family and social history reviewed and updated as indicated. Interim medical history since our last visit reviewed. Allergies and medications reviewed and updated. Outpatient Medications Prior to Visit  Medication Sig Dispense Refill  . amphetamine-dextroamphetamine (ADDERALL XR) 25 MG 24 hr capsule Take 1 capsule by mouth every morning. 30 capsule 0  . fexofenadine (ALLEGRA) 180 MG tablet Take 1 tablet (180 mg total) by mouth daily. 30 tablet 11  . fluticasone (FLONASE) 50 MCG/ACT nasal spray Place 2 sprays into both nostrils daily. 16 g 11  . Glucosamine-Chondroit-Vit C-Mn (GLUCOSAMINE 1500 COMPLEX) CAPS daily.    . Insulin Pen Needle (B-D ULTRAFINE III  SHORT PEN) 31G X 8 MM MISC Use as instructed to administer medication daily. 100 each 0  . Multiple Vitamin (MULTIVITAMIN) tablet Take 1 tablet by mouth daily.    . Omega-3 Fatty Acids (FISH OIL) 1000 MG CAPS Take 1 capsule (1,000 mg total) by mouth daily.    . Liraglutide -Weight Management (SAXENDA) 18 MG/3ML SOPN Inject 0.6 mg into the skin daily for 7 days, THEN 1.2 mg daily for 7 days, THEN 1.8 mg daily for 7 days, THEN 2.4 mg daily for 7 days, THEN 3 mg daily. 9 mL 1   No facility-administered medications prior to visit.     Per HPI unless specifically indicated in ROS section below Review of Systems Objective:  BP 108/76 (BP Location: Left Arm, Patient Position: Sitting, Cuff Size: Large)   Pulse 71   Temp (!) 97.3 F (36.3 C) (Temporal)   Ht 5' 11.5" (1.816 m)   Wt 287 lb (130.2 kg)   SpO2 97%   BMI 39.47 kg/m   Wt Readings from Last 3 Encounters:  10/25/20 287 lb (130.2 kg)  09/20/20 299 lb (135.6 kg)  08/17/19 294 lb 7 oz (133.6 kg)      Physical Exam Vitals and nursing note reviewed.  Constitutional:      Appearance: Normal appearance. He is obese. He is not ill-appearing.  Neck:     Thyroid: No thyroid mass or thyromegaly.  Cardiovascular:  Rate and Rhythm: Normal rate and regular rhythm.     Pulses: Normal pulses.     Heart sounds: Normal heart sounds. No murmur heard.   Pulmonary:     Effort: Pulmonary effort is normal. No respiratory distress.     Breath sounds: Normal breath sounds. No wheezing, rhonchi or rales.  Neurological:     Mental Status: He is alert.  Psychiatric:        Mood and Affect: Mood normal.        Behavior: Behavior normal.       Assessment & Plan:  This visit occurred during the SARS-CoV-2 public health emergency.  Safety protocols were in place, including screening questions prior to the visit, additional usage of staff PPE, and extensive cleaning of exam room while observing appropriate contact time as indicated for  disinfecting solutions.   Problem List Items Addressed This Visit    Severe obesity (BMI 35.0-39.9) with comorbidity ([place])    Congratulated on 4 % body weight loss to date on Saxenda.  Already note marked improvement in blood pressure control.  Discussed Saxenda use, to drop to 2.4mg  daily if unable to tolerate 3mg  dose due to nausea.  Reviewed 24 hour food recall.  Questions answered.  RTC 4-6 wks f/u visit.  Pt agrees with plan.       Relevant Medications   Liraglutide -Weight Management (SAXENDA) 18 MG/3ML SOPN       Meds ordered this encounter  Medications  . Liraglutide -Weight Management (SAXENDA) 18 MG/3ML SOPN    Sig: Inject 3 mg into the skin daily.    Dispense:  15 mL    Refill:  0   No orders of the defined types were placed in this encounter.   Patient Instructions  Continue 2.4-3mg  daily injection of saxenda depending on nausea.  You are doing great!  Return in 4-6 weeks for follow up visit.   Follow up plan: Return in about 4 weeks (around 11/22/2020), or if symptoms worsen or fail to improve, for follow up visit.  Ria Bush, MD

## 2020-11-21 ENCOUNTER — Other Ambulatory Visit: Payer: Self-pay | Admitting: Family Medicine

## 2020-11-23 MED ORDER — AMPHETAMINE-DEXTROAMPHET ER 25 MG PO CP24: 25.0000 mg | ORAL_CAPSULE | ORAL | 0 refills | Status: DC

## 2020-11-23 NOTE — Telephone Encounter (Signed)
ERx 

## 2020-11-23 NOTE — Telephone Encounter (Signed)
Name of Medication: Adderall XR Name of Pharmacy: [facility] Last Fill or Written Date and Quantity: 10/21/20, #30 Last Office Visit and Type: 10/25/20, wt mgmt f/u Next Office Visit and Type: 12/02/20, 4 wk wt mgmt f/u Last Controlled Substance Agreement Date: 08/17/19 Last UDS: 08/17/19

## 2020-11-28 ENCOUNTER — Ambulatory Visit: Payer: BC Managed Care – PPO | Admitting: Family Medicine

## 2020-12-02 ENCOUNTER — Ambulatory Visit: Payer: BC Managed Care – PPO | Admitting: Family Medicine

## 2020-12-02 ENCOUNTER — Encounter: Payer: Self-pay | Admitting: Family Medicine

## 2020-12-02 ENCOUNTER — Other Ambulatory Visit: Payer: Self-pay

## 2020-12-02 MED ORDER — LIRAGLUTIDE -WEIGHT MANAGEMENT 18 MG/3ML ~~LOC~~ SOPN: 3.0000 mg | PEN_INJECTOR | Freq: Every day | SUBCUTANEOUS | 0 refills | Status: DC

## 2020-12-02 NOTE — Assessment & Plan Note (Signed)
Congratulated on ongoing efforts with total 5.7% weight loss to date.  Continue saxenda 2.4-3mg  daily.  Reviewed healthy diet and lifestyle choices.  RTC 4-6 wks f/u visit.

## 2020-12-02 NOTE — Progress Notes (Signed)
Patient ID: Nathan Camacho, male    DOB: [birth year], 55 y.o.   MRN: [id number]  This visit was conducted in person.  BP 118/84   Pulse 72   Temp 97.9 F (36.6 C) (Temporal)   Ht 5' 11.5" (1.816 m)   Wt 282 lb 4 oz (128 kg)   SpO2 98%   BMI 38.82 kg/m    CC: weight management visit  Subjective:   HPI: Nathan Camacho is a 55 y.o. male presenting on 12/02/2020 for Weight Management (Here for 4 wk f/u.)   Starting weight: 299 lbs Last weight: 287 lbs Today's weight 282 lbs (5.7% total weight loss)  275 lbs on home scale.   Medication: Saxenda 2.4mg  daily, tolerating well without GI upset or constipation. Some nausea on 3mg  dose - but he has now retried this.   GERD managed with OTC med.   24 hour recall: See prior note - he has worked on protein intake - and started using premiere protein shakes daily as well as protein rich greek yogurt, peanut butter, etc.   Activity regimen: [employer] competitions at [employer] this week - lots of walking.  Planning 5k walk tomorrow in [place].  Bought new fitbit   Toe dislocation healing well - followed by podiatrist.      Relevant past medical, surgical, family and social history reviewed and updated as indicated. Interim medical history since our last visit reviewed. Allergies and medications reviewed and updated. Outpatient Medications Prior to Visit  Medication Sig Dispense Refill  . amphetamine-dextroamphetamine (ADDERALL XR) 25 MG 24 hr capsule Take 1 capsule by mouth every morning. 30 capsule 0  . fexofenadine (ALLEGRA) 180 MG tablet Take 1 tablet (180 mg total) by mouth daily. 30 tablet 11  . fluticasone (FLONASE) 50 MCG/ACT nasal spray Place 2 sprays into both nostrils daily. 16 g 11  . Glucosamine-Chondroit-Vit C-Mn (GLUCOSAMINE 1500 COMPLEX) CAPS daily.    . Insulin Pen Needle (B-D ULTRAFINE III SHORT PEN) 31G X 8 MM MISC Use as instructed to administer medication daily. 100 each 0  . Multiple Vitamin (MULTIVITAMIN)  tablet Take 1 tablet by mouth daily.    . Omega-3 Fatty Acids (FISH OIL) 1000 MG CAPS Take 1 capsule (1,000 mg total) by mouth daily.    . Liraglutide -Weight Management 18 MG/3ML SOPN Inject 3 mg into the skin daily.     No facility-administered medications prior to visit.     Per HPI unless specifically indicated in ROS section below Review of Systems Objective:  BP 118/84   Pulse 72   Temp 97.9 F (36.6 C) (Temporal)   Ht 5' 11.5" (1.816 m)   Wt 282 lb 4 oz (128 kg)   SpO2 98%   BMI 38.82 kg/m   Wt Readings from Last 3 Encounters:  12/02/20 282 lb 4 oz (128 kg)  10/25/20 287 lb (130.2 kg)  09/20/20 299 lb (135.6 kg)      Physical Exam Vitals and nursing note reviewed.  Constitutional:      Appearance: Normal appearance. He is obese. He is not ill-appearing.  Cardiovascular:     Rate and Rhythm: Normal rate and regular rhythm.     Pulses: Normal pulses.     Heart sounds: Normal heart sounds. No murmur heard.   Pulmonary:     Effort: Pulmonary effort is normal. No respiratory distress.     Breath sounds: Normal breath sounds. No wheezing, rhonchi or rales.  Neurological:  Mental Status: He is alert.  Psychiatric:        Mood and Affect: Mood normal.        Behavior: Behavior normal.        Assessment & Plan:  This visit occurred during the SARS-CoV-2 public health emergency.  Safety protocols were in place, including screening questions prior to the visit, additional usage of staff PPE, and extensive cleaning of exam room while observing appropriate contact time as indicated for disinfecting solutions.   Problem List Items Addressed This Visit    Severe obesity (BMI 35.0-39.9) with comorbidity ([place])    Congratulated on ongoing efforts with total 5.7% weight loss to date.  Continue saxenda 2.4-3mg  daily.  Reviewed healthy diet and lifestyle choices.  RTC 4-6 wks f/u visit.       Relevant Medications   Liraglutide -Weight Management 18 MG/3ML SOPN        Meds ordered this encounter  Medications  . Liraglutide -Weight Management 18 MG/3ML SOPN    Sig: Inject 3 mg into the skin daily.    Dispense:  15 mL    Refill:  0   No orders of the defined types were placed in this encounter.  Patient Instructions  Good to see you today  Congratulations on ongoing weight loss!  Continue healthy diet and lifestyle changes to date.  Return in 4-6 weeks for follow up visit   Follow up plan: Return in about 4 weeks (around 12/30/2020) for follow up visit.  Ria Bush, MD

## 2020-12-02 NOTE — Patient Instructions (Signed)
Good to see you today  Congratulations on ongoing weight loss!  Continue healthy diet and lifestyle changes to date.  Return in 4-6 weeks for follow up visit

## 2020-12-21 ENCOUNTER — Other Ambulatory Visit: Payer: Self-pay | Admitting: Family Medicine

## 2020-12-21 MED ORDER — BD PEN NEEDLE SHORT U/F 31G X 8 MM MISC
0 refills | Status: DC
Start: 2020-12-21 — End: 2021-03-29

## 2020-12-21 NOTE — Telephone Encounter (Signed)
Pharmacy requests refill on: Insulin Pen Needle 31 G X 8 mm  LAST REFILL: 09/26/2020 (Q-100, R-0) LAST OV: 12/02/2020 NEXT OV: 01/04/2021 PHARMACY: [facility] [place] requests refill on: Amphetamine-Dextroamphetamine 25 mg 24 hr    LAST REFILL: 11/23/2020 (Q-30, R-0) LAST OV: 12/02/2020 NEXT OV: 01/04/2021 PHARMACY: [id number] [place], [place] UDS & Contract: 08/17/2019

## 2020-12-23 MED ORDER — AMPHETAMINE-DEXTROAMPHET ER 25 MG PO CP24: 25.0000 mg | ORAL_CAPSULE | ORAL | 0 refills | Status: DC

## 2020-12-23 NOTE — Telephone Encounter (Signed)
ERx 

## 2021-01-04 ENCOUNTER — Other Ambulatory Visit: Payer: Self-pay

## 2021-01-04 ENCOUNTER — Encounter: Payer: Self-pay | Admitting: Family Medicine

## 2021-01-04 ENCOUNTER — Ambulatory Visit: Payer: BC Managed Care – PPO | Admitting: Family Medicine

## 2021-01-04 MED ORDER — LIRAGLUTIDE -WEIGHT MANAGEMENT 18 MG/3ML ~~LOC~~ SOPN: 3.0000 mg | PEN_INJECTOR | Freq: Every day | SUBCUTANEOUS | 1 refills | Status: DC

## 2021-01-04 NOTE — Assessment & Plan Note (Signed)
Congratulated on ongoing weight loss - total 6% body weight loss to date.  Continue saxenda 3mg  daily.  RTC 6 wks f/u visit.

## 2021-01-04 NOTE — Patient Instructions (Addendum)
You are doing great today - continue saxenda. Return in 6 weeks for follow up visit.

## 2021-01-04 NOTE — Progress Notes (Signed)
Patient ID: Nathan Camacho, male    DOB: 1966-08-03, 55 y.o.   MRN: [id number]  This visit was conducted in person.  BP 114/72   Pulse 74   Temp 97.9 F (36.6 C) (Temporal)   Ht 5' 11.5" (1.816 m)   Wt 272 lb (123.4 kg)   SpO2 98%   BMI 37.41 kg/m    CC: 4-6 wk f/u visit  Subjective:   HPI: Nathan Camacho is a 55 y.o. male presenting on 01/04/2021 for Weight Management (Here for 4-6 wk f/u.)   Starting weight: 299 lbs Last weight: 282 lbs Today's weight 272 lbs  Medication: Saxenda 3mg  daily, started 09/2020. Tolerating well without GI upset, constipation, nausea. Notes GERD has improved.   24 hour recall: Limited portion sizes.  Focusing on protein source with each meal.  Breakfast - banana, boiled egg, protein shake or greek yogurt  Activity regimen: Tries to regularly walk, active in the yard as well. Could do better with daily walking.      Relevant past medical, surgical, family and social history reviewed and updated as indicated. Interim medical history since our last visit reviewed. Allergies and medications reviewed and updated. Outpatient Medications Prior to Visit  Medication Sig Dispense Refill  . amphetamine-dextroamphetamine (ADDERALL XR) 25 MG 24 hr capsule Take 1 capsule by mouth every morning. 30 capsule 0  . fexofenadine (ALLEGRA) 180 MG tablet Take 1 tablet (180 mg total) by mouth daily. 30 tablet 11  . fluticasone (FLONASE) 50 MCG/ACT nasal spray Place 2 sprays into both nostrils daily. 16 g 11  . Glucosamine-Chondroit-Vit C-Mn (GLUCOSAMINE 1500 COMPLEX) CAPS daily.    . Insulin Pen Needle (B-D ULTRAFINE III SHORT PEN) 31G X 8 MM MISC Use as instructed to administer medication daily. 100 each 0  . Multiple Vitamin (MULTIVITAMIN) tablet Take 1 tablet by mouth daily.    . Omega-3 Fatty Acids (FISH OIL) 1000 MG CAPS Take 1 capsule (1,000 mg total) by mouth daily.    . Liraglutide -Weight Management 18 MG/3ML SOPN Inject 3 mg into the skin daily. 15 mL  0   No facility-administered medications prior to visit.     Per HPI unless specifically indicated in ROS section below Review of Systems Objective:  BP 114/72   Pulse 74   Temp 97.9 F (36.6 C) (Temporal)   Ht 5' 11.5" (1.816 m)   Wt 272 lb (123.4 kg)   SpO2 98%   BMI 37.41 kg/m   Wt Readings from Last 3 Encounters:  01/04/21 272 lb (123.4 kg)  12/02/20 282 lb 4 oz (128 kg)  10/25/20 287 lb (130.2 kg)      Physical Exam Vitals and nursing note reviewed.  Constitutional:      Appearance: Normal appearance. He is not ill-appearing.  Neck:     Thyroid: No thyroid mass or thyromegaly.  Cardiovascular:     Rate and Rhythm: Normal rate and regular rhythm.     Pulses: Normal pulses.     Heart sounds: No murmur heard.   Pulmonary:     Effort: Pulmonary effort is normal. No respiratory distress.     Breath sounds: Normal breath sounds. No wheezing, rhonchi or rales.  Abdominal:     General: Bowel sounds are normal. There is no distension.     Palpations: Abdomen is soft. There is no mass.     Tenderness: There is no abdominal tenderness. There is no guarding or rebound.     Hernia: No  hernia is present.  Musculoskeletal:     Right lower leg: No edema.     Left lower leg: No edema.  Skin:    General: Skin is warm and dry.     Findings: No rash.  Neurological:     Mental Status: He is alert.  Psychiatric:        Mood and Affect: Mood normal.        Behavior: Behavior normal.       Assessment & Plan:  This visit occurred during the SARS-CoV-2 public health emergency.  Safety protocols were in place, including screening questions prior to the visit, additional usage of staff PPE, and extensive cleaning of exam room while observing appropriate contact time as indicated for disinfecting solutions.   Problem List Items Addressed This Visit    Severe obesity (BMI 35.0-39.9) with comorbidity ([place])    Congratulated on ongoing weight loss - total 6% body weight loss to  date.  Continue saxenda 3mg  daily.  RTC 6 wks f/u visit.       Relevant Medications   Liraglutide -Weight Management 18 MG/3ML SOPN       Meds ordered this encounter  Medications  . Liraglutide -Weight Management 18 MG/3ML SOPN    Sig: Inject 3 mg into the skin daily.    Dispense:  15 mL    Refill:  1   No orders of the defined types were placed in this encounter.   Patient Instructions  You are doing great today - continue saxenda. Return in 6 weeks for follow up visit.    Follow up plan: Return in about 6 weeks (around 02/15/2021) for follow up visit.  Ria Bush, MD

## 2021-01-11 ENCOUNTER — Encounter: Payer: Self-pay | Admitting: Family Medicine

## 2021-01-22 ENCOUNTER — Other Ambulatory Visit: Payer: Self-pay | Admitting: Family Medicine

## 2021-01-23 NOTE — Telephone Encounter (Signed)
LAST APPOINTMENT DATE: 01/04/2021   NEXT APPOINTMENT DATE: 02/15/2021   UDS and contract  08/17/2019

## 2021-01-24 MED ORDER — AMPHETAMINE-DEXTROAMPHET ER 25 MG PO CP24: 25.0000 mg | ORAL_CAPSULE | ORAL | 0 refills | Status: DC

## 2021-01-24 NOTE — Telephone Encounter (Signed)
ERx 

## 2021-01-31 ENCOUNTER — Telehealth: Payer: Self-pay

## 2021-01-31 NOTE — Telephone Encounter (Signed)
Received faxed PA renewal for liraglutide (Saxenda) 18 mg/3 ml SOPN.  Submitted PA renewal; key:  [id number].  Decision pending.

## 2021-02-01 NOTE — Telephone Encounter (Signed)
Received faxed PA renewal approval, valid 01/31/2021- 01/31/2022.

## 2021-02-15 ENCOUNTER — Other Ambulatory Visit: Payer: Self-pay

## 2021-02-15 ENCOUNTER — Encounter: Payer: Self-pay | Admitting: Family Medicine

## 2021-02-15 ENCOUNTER — Ambulatory Visit: Payer: BC Managed Care – PPO | Admitting: Family Medicine

## 2021-02-15 NOTE — Patient Instructions (Addendum)
You are doing great today Continue saxenda 3mg  daily Return in 6 weeks for follow up visit.

## 2021-02-15 NOTE — Assessment & Plan Note (Signed)
Ongoing weight loss noted, tolerating Saxenda well - continue this. Discussed healthy physical activity goals.   RTC 6 wks f/u visit

## 2021-02-15 NOTE — Progress Notes (Signed)
Patient ID: Nathan Camacho, male    DOB: 17-Oct-1965, 55 y.o.   MRN: [id number]  This visit was conducted in person.  BP 124/82   Pulse 73   Temp 98.2 F (36.8 C) (Temporal)   Ht 5' 11.5" (1.816 m)   Wt 265 lb (120.2 kg)   SpO2 97%   BMI 36.44 kg/m    CC: 6 wk f/u visit  Subjective:   HPI: Nathan Camacho is a 55 y.o. male presenting on 02/15/2021 for Follow-up (6 wk/)   Starting weight: 299 lbs Last weight: 272 lbs Today's weight 265 lbs (total 11.4% body weight weight loss)   Medication: Saxenda 3mg  daily started 09/2020. Tolerating well without GI upset, constipation, nausea, epigastric pain.   24 hour recall: Not done.  Limiting portion sizes. Choosing protein source with each meal.   Activity regimen: Occasionally walks during lunch. More active at work and with home renovations  Busy recent weeks, hopeful to have more time later in the summer.      Relevant past medical, surgical, family and social history reviewed and updated as indicated. Interim medical history since our last visit reviewed. Allergies and medications reviewed and updated. Outpatient Medications Prior to Visit  Medication Sig Dispense Refill   amphetamine-dextroamphetamine (ADDERALL XR) 25 MG 24 hr capsule Take 1 capsule by mouth every morning. 30 capsule 0   fexofenadine (ALLEGRA) 180 MG tablet Take 1 tablet (180 mg total) by mouth daily. 30 tablet 11   fluticasone (FLONASE) 50 MCG/ACT nasal spray Place 2 sprays into both nostrils daily. 16 g 11   Glucosamine-Chondroit-Vit C-Mn (GLUCOSAMINE 1500 COMPLEX) CAPS daily.     Insulin Pen Needle (B-D ULTRAFINE III SHORT PEN) 31G X 8 MM MISC Use as instructed to administer medication daily. 100 each 0   Liraglutide -Weight Management 18 MG/3ML SOPN Inject 3 mg into the skin daily. 15 mL 1   Multiple Vitamin (MULTIVITAMIN) tablet Take 1 tablet by mouth daily.     Omega-3 Fatty Acids (FISH OIL) 1000 MG CAPS Take 1 capsule (1,000 mg total) by mouth  daily.     No facility-administered medications prior to visit.     Per HPI unless specifically indicated in ROS section below Review of Systems  Objective:  BP 124/82   Pulse 73   Temp 98.2 F (36.8 C) (Temporal)   Ht 5' 11.5" (1.816 m)   Wt 265 lb (120.2 kg)   SpO2 97%   BMI 36.44 kg/m   Wt Readings from Last 3 Encounters:  02/15/21 265 lb (120.2 kg)  01/04/21 272 lb (123.4 kg)  12/02/20 282 lb 4 oz (128 kg)      Physical Exam Vitals and nursing note reviewed.  Constitutional:      Appearance: Normal appearance. He is not ill-appearing.  Cardiovascular:     Rate and Rhythm: Normal rate and regular rhythm.     Pulses: Normal pulses.     Heart sounds: Normal heart sounds. No murmur heard. Pulmonary:     Effort: Pulmonary effort is normal. No respiratory distress.     Breath sounds: Normal breath sounds. No wheezing, rhonchi or rales.  Abdominal:     General: Bowel sounds are normal. There is no distension.     Palpations: Abdomen is soft. There is no mass.     Tenderness: There is no abdominal tenderness. There is no guarding or rebound.     Hernia: No hernia is present.  Musculoskeletal:  Right lower leg: No edema.     Left lower leg: No edema.  Skin:    General: Skin is warm and dry.     Findings: No rash.  Neurological:     Mental Status: He is alert.  Psychiatric:        Mood and Affect: Mood normal.        Behavior: Behavior normal.       Assessment & Plan:  This visit occurred during the SARS-CoV-2 public health emergency.  Safety protocols were in place, including screening questions prior to the visit, additional usage of staff PPE, and extensive cleaning of exam room while observing appropriate contact time as indicated for disinfecting solutions.   Problem List Items Addressed This Visit     Severe obesity (BMI 35.0-39.9) with comorbidity ([place])    Ongoing weight loss noted, tolerating Saxenda well - continue this. Discussed healthy physical  activity goals.   RTC 6 wks f/u visit          No orders of the defined types were placed in this encounter.  No orders of the defined types were placed in this encounter.   Patient Instructions  You are doing great today Continue saxenda 3mg  daily Return in 6 weeks for follow up visit.   Follow up plan: Return in about 6 weeks (around 03/29/2021) for follow up visit.  Ria Bush, MD

## 2021-02-22 ENCOUNTER — Other Ambulatory Visit: Payer: Self-pay | Admitting: Family Medicine

## 2021-02-22 MED ORDER — AMPHETAMINE-DEXTROAMPHET ER 25 MG PO CP24: 25.0000 mg | ORAL_CAPSULE | ORAL | 0 refills | Status: DC

## 2021-02-22 NOTE — Telephone Encounter (Signed)
ERx 

## 2021-02-22 NOTE — Telephone Encounter (Signed)
Refill request for amphetamine-dextroamphetamine (ADDERALL XR) 25 MG 24 hr capsule  LOV - 02/15/21 Next OV - 03/29/21 Last refill - 01/24/21 #30/0

## 2021-02-25 ENCOUNTER — Other Ambulatory Visit: Payer: Self-pay | Admitting: Family Medicine

## 2021-02-26 IMAGING — DX DG TOE 2ND 2+V*R*
2 series · 2 of 2 positions shown · non-contrast
Comparison: Toe radiograph September 26, 2020

CLINICAL DATA: Dislocation status post reduction film

EXAM:
RIGHT SECOND TOE

[toe ap]
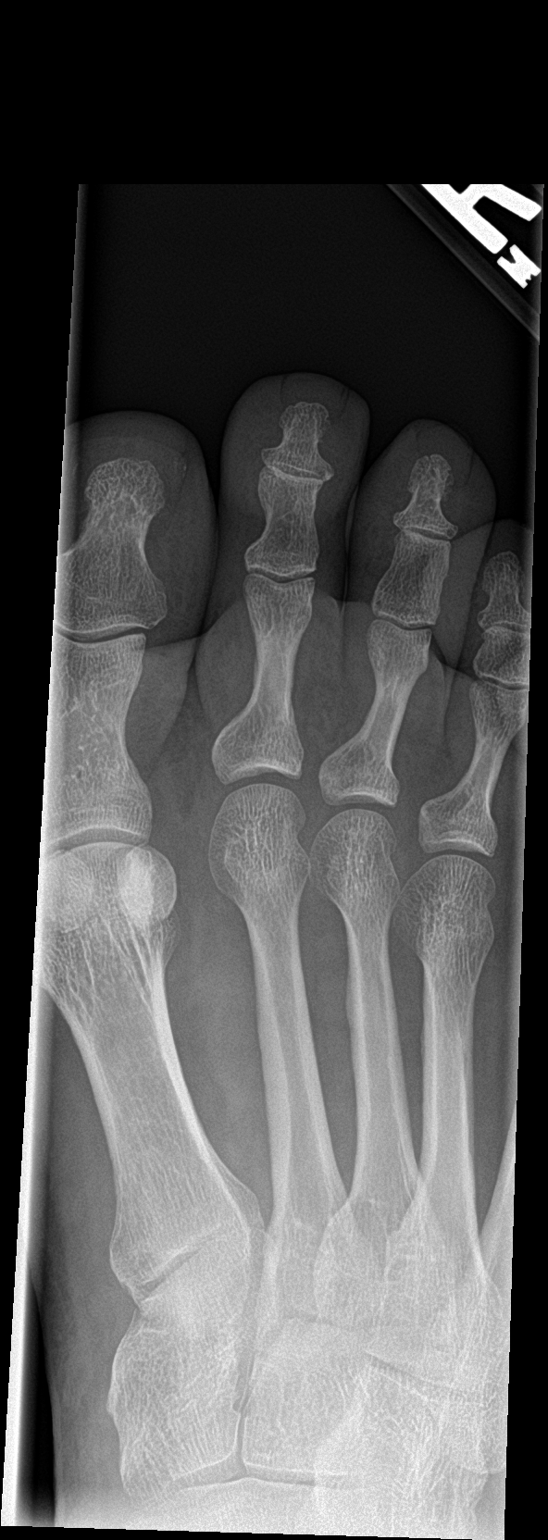

[toe lat]
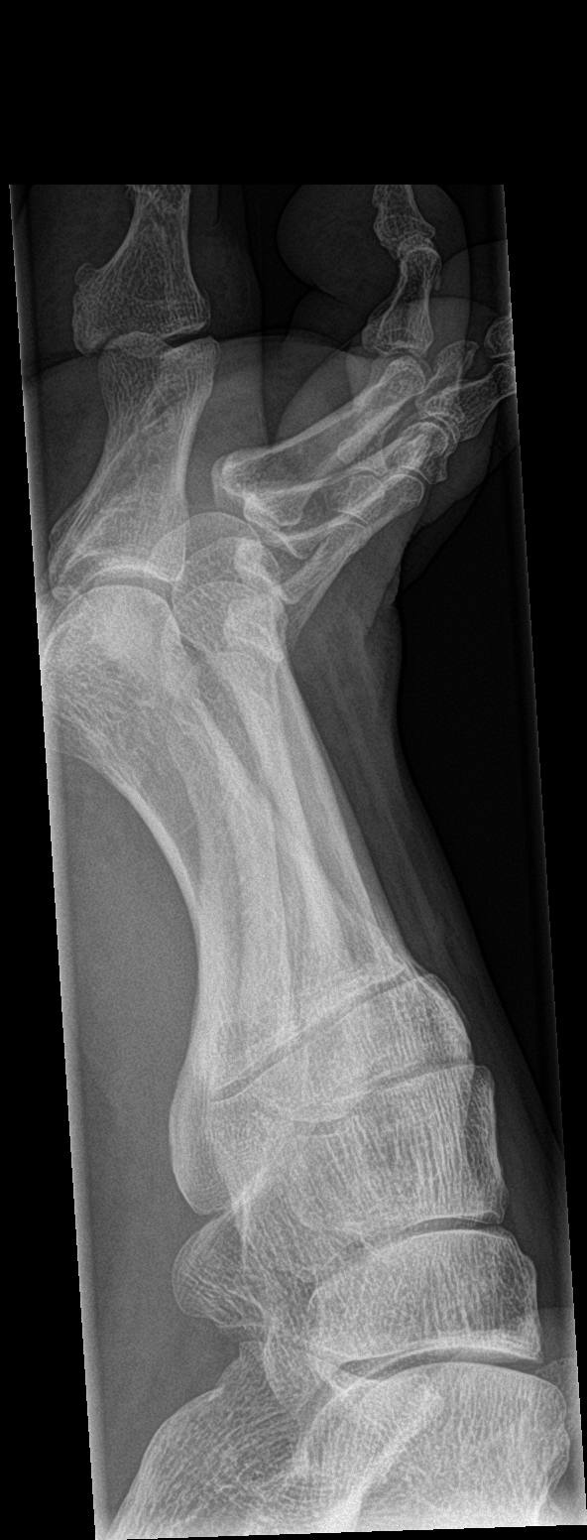

[2 of 2 positions shown; findings below may reference images not displayed]

FINDINGS: Interval reduction with satisfactory alignment of the second digit
at the level of the proximal interphalangeal joint. Cortical
irregularity with a small ossific fragment along the dorsal and
distal aspect the middle phalanx.
IMPRESSION: Interval reduction of the second digit at the level of the proximal
interphalangeal joint.

Cortical irregularity with a small ossific fragment along the dorsal
and distal aspect the middle phalanx, concerning for displaced
fracture fragment.

## 2021-02-28 NOTE — Telephone Encounter (Signed)
Saxenda Last filled:  02/10/21, #51 mL Last OV:  02/15/21, wt mgmt f/u Next OV:  03/29/21, 6 wk mgmt f/u

## 2021-02-28 NOTE — Telephone Encounter (Signed)
ERx 

## 2021-03-24 ENCOUNTER — Other Ambulatory Visit: Payer: Self-pay | Admitting: Family Medicine

## 2021-03-24 MED ORDER — AMPHETAMINE-DEXTROAMPHET ER 25 MG PO CP24: 25.0000 mg | ORAL_CAPSULE | ORAL | 0 refills | Status: DC

## 2021-03-24 NOTE — Telephone Encounter (Signed)
  Encourage patient to contact the pharmacy for refills or they can request refills through [place]:  Please schedule appointment if longer than 1 year  NEXT APPOINTMENT DATE: 03/29/21  MEDICATION: amphetamine-dextroamphetamine (ADDERALL XR) 25 MG 24 hr capsule  Is the patient out of medication? yes  PHARMACY: [facility]   Let patient know to contact pharmacy at the end of the day to make sure medication is ready.  Please notify patient to allow 48-72 hours to process  CLINICAL FILLS OUT ALL BELOW:   LAST REFILL:  QTY:  REFILL DATE:    OTHER COMMENTS:    Okay for refill?  Please advise

## 2021-03-24 NOTE — Telephone Encounter (Signed)
Name of Medication: Adderall XR Name of Pharmacy: [facility] Last Fill or Written Date and Quantity: 02/22/21, #30 Last Office Visit and Type: 02/15/21, wt mgmt f/u Next Office Visit and Type: 03/29/21, wt mgmt f/u Last Controlled Substance Agreement Date: 08/17/19 Last UDS: 08/17/19

## 2021-03-24 NOTE — Telephone Encounter (Signed)
ERx 

## 2021-03-29 ENCOUNTER — Ambulatory Visit: Payer: BC Managed Care – PPO | Admitting: Family Medicine

## 2021-03-29 ENCOUNTER — Encounter: Payer: Self-pay | Admitting: Family Medicine

## 2021-03-29 ENCOUNTER — Other Ambulatory Visit: Payer: Self-pay

## 2021-03-29 MED ORDER — BD PEN NEEDLE SHORT U/F 31G X 8 MM MISC: 0 refills | Status: DC

## 2021-03-29 NOTE — Assessment & Plan Note (Addendum)
Weight loss has plateaued on max dose saxenda. 24 hour recall reviewed. Will work on regular walking routine. RTC 6 wks f/u visit

## 2021-03-29 NOTE — Patient Instructions (Addendum)
Continue saxenda '3mg'$  daily.  Continue regular walking routine.  Return in another 6 weeks for follow up visit.

## 2021-03-29 NOTE — Progress Notes (Signed)
Patient ID: Nathan Camacho, male    DOB: 08/13/1965, 55 y.o.   MRN: [id number]  This visit was conducted in person.  BP 126/84   Pulse 72   Temp (!) 97.2 F (36.2 C) (Temporal)   Ht 5' 11.5" (1.816 m)   Wt 266 lb (120.7 kg)   SpO2 97%   BMI 36.58 kg/m    CC: 6 wk f/u visit  Subjective:   HPI: AHMAN PIRL is a 55 y.o. male presenting on 03/29/2021 for Follow-up   Starting weight: 299  lbs Last weight: 265 lbs Today's weight 266 lbs (11% total body weight loss)  Medication: saxenda '3mg'$  daily started 09/2020. Tolerating well without constipation, nausea, vomiting, epigastric pain.   24 hour recall: 6:30am - black coffee, greek yogurt 10gm protein and 30gm protein shake 12pm - water, 2 handfuls of granola with greek yogurt Skipped dinner  8pm - night time snack - bottles of water, gatorade zero, about 10 ritz crackers with creamy peanut butter, 30 gm protein shake   More meats on weekends.   Activity regimen: Has started walking more regularly - 3 miles on Sunday in <1hr. Wants to walk every 2-3 days. Has fit bit he uses to track his walking      Relevant past medical, surgical, family and social history reviewed and updated as indicated. Interim medical history since our last visit reviewed. Allergies and medications reviewed and updated. Outpatient Medications Prior to Visit  Medication Sig Dispense Refill   amphetamine-dextroamphetamine (ADDERALL XR) 25 MG 24 hr capsule Take 1 capsule by mouth every morning. 30 capsule 0   fexofenadine (ALLEGRA) 180 MG tablet Take 1 tablet (180 mg total) by mouth daily. 30 tablet 11   fluticasone (FLONASE) 50 MCG/ACT nasal spray Place 2 sprays into both nostrils daily. 16 g 11   Glucosamine-Chondroit-Vit C-Mn (GLUCOSAMINE 1500 COMPLEX) CAPS daily.     Liraglutide -Weight Management (SAXENDA) 18 MG/3ML SOPN INJECT 3 MG INTO THE SKIN DAILY. 15 mL 1   Multiple Vitamin (MULTIVITAMIN) tablet Take 1 tablet by mouth daily.     Omega-3  Fatty Acids (FISH OIL) 1000 MG CAPS Take 1 capsule (1,000 mg total) by mouth daily.     Insulin Pen Needle (B-D ULTRAFINE III SHORT PEN) 31G X 8 MM MISC Use as instructed to administer medication daily. 100 each 0   No facility-administered medications prior to visit.     Per HPI unless specifically indicated in ROS section below Review of Systems  Objective:  BP 126/84   Pulse 72   Temp (!) 97.2 F (36.2 C) (Temporal)   Ht 5' 11.5" (1.816 m)   Wt 266 lb (120.7 kg)   SpO2 97%   BMI 36.58 kg/m   Wt Readings from Last 3 Encounters:  03/29/21 266 lb (120.7 kg)  02/15/21 265 lb (120.2 kg)  01/04/21 272 lb (123.4 kg)      Physical Exam Vitals and nursing note reviewed.  Constitutional:      Appearance: Normal appearance. He is not ill-appearing.  Neck:     Thyroid: No thyroid mass or thyromegaly.  Cardiovascular:     Rate and Rhythm: Normal rate and regular rhythm.     Pulses: Normal pulses.     Heart sounds: Normal heart sounds. No murmur heard. Pulmonary:     Effort: Pulmonary effort is normal. No respiratory distress.     Breath sounds: Normal breath sounds. No wheezing, rhonchi or rales.  Abdominal:  General: Bowel sounds are normal. There is no distension.     Palpations: Abdomen is soft. There is no mass.     Tenderness: There is no abdominal tenderness. There is no guarding or rebound.     Hernia: No hernia is present.  Musculoskeletal:     Right lower leg: No edema.     Left lower leg: No edema.  Neurological:     Mental Status: He is alert.  Psychiatric:        Mood and Affect: Mood normal.        Behavior: Behavior normal.       Assessment & Plan:  This visit occurred during the SARS-CoV-2 public health emergency.  Safety protocols were in place, including screening questions prior to the visit, additional usage of staff PPE, and extensive cleaning of exam room while observing appropriate contact time as indicated for disinfecting solutions.   Problem  List Items Addressed This Visit     Severe obesity (BMI 35.0-39.9) with comorbidity ([place]) - Primary    Weight loss has plateaued on max dose saxenda. 24 hour recall reviewed. Will work on regular walking routine. RTC 6 wks f/u visit       Relevant Medications   Insulin Pen Needle (B-D ULTRAFINE III SHORT PEN) 31G X 8 MM MISC   Other Visit Diagnoses     Obesity, morbid, BMI 40.0-49.9 ([place])            Meds ordered this encounter  Medications   Insulin Pen Needle (B-D ULTRAFINE III SHORT PEN) 31G X 8 MM MISC    Sig: Use as instructed to administer medication daily.    Dispense:  100 each    Refill:  0   No orders of the defined types were placed in this encounter.    Patient Instructions  Continue saxenda '3mg'$  daily.  Continue regular walking routine.  Return in another 6 weeks for follow up visit.   Follow up plan: Return in about 6 weeks (around 05/10/2021), or if symptoms worsen or fail to improve, for follow up visit.  Ria Bush, MD

## 2021-04-17 ENCOUNTER — Encounter: Payer: Self-pay | Admitting: Nurse Practitioner

## 2021-04-17 ENCOUNTER — Telehealth (INDEPENDENT_AMBULATORY_CARE_PROVIDER_SITE_OTHER): Payer: BC Managed Care – PPO | Admitting: Nurse Practitioner

## 2021-04-17 VITALS — Temp 99.2°F

## 2021-04-17 DIAGNOSIS — U071 COVID-19: Secondary | ICD-10-CM

## 2021-04-17 HISTORY — DX: COVID-19: U07.1

## 2021-04-17 MED ORDER — MOLNUPIRAVIR EUA 200MG CAPSULE: 4.0000 | ORAL_CAPSULE | Freq: Two times a day (BID) | ORAL | 0 refills | Status: AC

## 2021-04-17 NOTE — Patient Instructions (Addendum)
Nice to see you virtually today Sent to pharmacy of choice Let us know if your symptoms fail to improve or worsen

## 2021-04-17 NOTE — Assessment & Plan Note (Addendum)
Discussed treatment with patient we did agree to start on molnupiravir.  Discussed with patient this is emergency use authorized only.  Patient can continue to use over-the-counter treatments for symptomatic relief.  He will go ahead and start molnupiravir. Also discussed current CDC guidelines in regards to quarantine and retesting.  Patient acknowledged

## 2021-04-17 NOTE — Progress Notes (Signed)
Patient ID: Nathan Camacho, male    DOB: 06-03-1966, 55 y.o.   MRN: [id number]  Virtual visit completed through [employer], a video enabled telemedicine application. Due to national recommendations of social distancing due to COVID-19, a virtual visit is felt to be most appropriate for this patient at this time. Reviewed limitations, risks, security and privacy concerns of performing a virtual visit and the availability of in person appointments. I also reviewed that there may be a patient responsible charge related to this service. The patient agreed to proceed.   Patient location: home Provider location: [place] at [facility], office Persons participating in this virtual visit: patient, provider   If any vitals were documented, they were collected by patient at home unless specified below.    Temp 99.2 F (37.3 C)    CC: Covid 19 infection  Subjective:   HPI: Nathan Camacho is a 55 y.o. male presenting on 04/17/2021 for    Symptoms started on 04/15/2021, he took an at home test on 04/16/2021 and it was positive. Patient is vaccinated against covid 19 with 2 pfizer's and 1 booster. Has not tried any OTC medications at current time.    Relevant past medical, surgical, family and social history reviewed and updated as indicated. Interim medical history since our last visit reviewed. Allergies and medications reviewed and updated. Outpatient Medications Prior to Visit  Medication Sig Dispense Refill   amphetamine-dextroamphetamine (ADDERALL XR) 25 MG 24 hr capsule Take 1 capsule by mouth every morning. 30 capsule 0   Insulin Pen Needle (B-D ULTRAFINE III SHORT PEN) 31G X 8 MM MISC Use as instructed to administer medication daily. 100 each 0   Liraglutide -Weight Management (SAXENDA) 18 MG/3ML SOPN INJECT 3 MG INTO THE SKIN DAILY. 15 mL 1   fexofenadine (ALLEGRA) 180 MG tablet Take 1 tablet (180 mg total) by mouth daily. (Patient not taking: Reported on 04/17/2021) 30 tablet 11    fluticasone (FLONASE) 50 MCG/ACT nasal spray Place 2 sprays into both nostrils daily. (Patient not taking: Reported on 04/17/2021) 16 g 11   Glucosamine-Chondroit-Vit C-Mn (GLUCOSAMINE 1500 COMPLEX) CAPS daily. (Patient not taking: Reported on 04/17/2021)     Multiple Vitamin (MULTIVITAMIN) tablet Take 1 tablet by mouth daily. (Patient not taking: Reported on 04/17/2021)     Omega-3 Fatty Acids (FISH OIL) 1000 MG CAPS Take 1 capsule (1,000 mg total) by mouth daily. (Patient not taking: No sig reported)     No facility-administered medications prior to visit.     Per HPI unless specifically indicated in ROS section below Review of Systems  Constitutional:  Positive for chills and fatigue. Negative for fever.       Taste has dulled   HENT:  Positive for congestion, postnasal drip and sore throat.   Respiratory:  Positive for cough. Negative for shortness of breath.   Cardiovascular:  Negative for chest pain.  Gastrointestinal:  Negative for abdominal pain, diarrhea, nausea and vomiting.  Musculoskeletal:  Positive for myalgias. Negative for arthralgias.  Objective:  Temp 99.2 F (37.3 C)   Wt Readings from Last 3 Encounters:  03/29/21 266 lb (120.7 kg)  02/15/21 265 lb (120.2 kg)  01/04/21 272 lb (123.4 kg)       Physical exam: Gen: alert, NAD, not ill appearing Pulm: speaks in complete sentences without increased work of breathing Psych: normal mood, normal thought content      Results for orders placed or performed in visit on 09/08/20  Hepatitis C antibody  Result Value Ref Range   Hepatitis C Ab NON-REACTIVE NON-REACTI   SIGNAL TO CUT-OFF 0.00 <1.00  PSA  Result Value Ref Range   PSA 0.47 0.10 - 4.00 ng/mL  Hemoglobin A1c  Result Value Ref Range   Hgb A1c MFr Bld 5.6 4.6 - 6.5 %  Comprehensive metabolic panel  Result Value Ref Range   Sodium 138 135 - 145 mEq/L   Potassium 4.9 3.5 - 5.1 mEq/L   Chloride 104 96 - 112 mEq/L   CO2 30 19 - 32 mEq/L   Glucose, Bld 95 70 -  99 mg/dL   BUN 19 6 - 23 mg/dL   Creatinine, Ser 1.06 0.40 - 1.50 mg/dL   Total Bilirubin 0.6 0.2 - 1.2 mg/dL   Alkaline Phosphatase 84 39 - 117 U/L   AST 16 0 - 37 U/L   ALT 27 0 - 53 U/L   Total Protein 6.8 6.0 - 8.3 g/dL   Albumin 4.4 3.5 - 5.2 g/dL   GFR 80.54 >60.00 mL/min   Calcium 9.6 8.4 - 10.5 mg/dL  Lipid panel  Result Value Ref Range   Cholesterol 186 0 - 200 mg/dL   Triglycerides 95.0 0.0 - 149.0 mg/dL   HDL 49.40 >39.00 mg/dL   VLDL 19.0 0.0 - 40.0 mg/dL   LDL Cholesterol 117 (H) 0 - 99 mg/dL   Total CHOL/HDL Ratio 4    NonHDL 136.24    Assessment & Plan:   Problem List Items Addressed This Visit       Other   COVID-19 - Primary    Discussed treatment with patient we did agree to start on molnupiravir.  Discussed with patient this is emergency use authorized only.  Patient can continue to use over-the-counter treatments for symptomatic relief.  He will go ahead and start molnupiravir. Also discussed current CDC guidelines in regards to quarantine and retesting.  Patient acknowledged      Relevant Medications   molnupiravir EUA 200 mg CAPS     No orders of the defined types were placed in this encounter.  No orders of the defined types were placed in this encounter.   I discussed the assessment and treatment plan with the patient. The patient was provided an opportunity to ask questions and all were answered. The patient agreed with the plan and demonstrated an understanding of the instructions. The patient was advised to call back or seek an in-person evaluation if the symptoms worsen or if the condition fails to improve as anticipated.  Follow up plan: No follow-ups on file.  Romilda Garret, NP

## 2021-04-28 ENCOUNTER — Other Ambulatory Visit: Payer: Self-pay | Admitting: Family Medicine

## 2021-05-01 MED ORDER — AMPHETAMINE-DEXTROAMPHET ER 25 MG PO CP24: 25.0000 mg | ORAL_CAPSULE | ORAL | 0 refills | Status: DC

## 2021-05-01 NOTE — Telephone Encounter (Signed)
Name of Medication: Adderall Name of Pharmacy: [facility] Last Fill or Written Date and Quantity: 03/24/21 #30 Last Office Visit and Type: 04/17/21 acute Next Office Visit and Type: 05/10/21 Last Controlled Substance Agreement Date: 08/17/19 Last UDS:08/17/19

## 2021-05-01 NOTE — Telephone Encounter (Signed)
ERx 

## 2021-05-05 ENCOUNTER — Ambulatory Visit: Payer: BC Managed Care – PPO | Admitting: Primary Care

## 2021-05-05 ENCOUNTER — Other Ambulatory Visit: Payer: Self-pay

## 2021-05-05 ENCOUNTER — Encounter: Payer: Self-pay | Admitting: Primary Care

## 2021-05-05 DIAGNOSIS — M546 Pain in thoracic spine: Secondary | ICD-10-CM | POA: Insufficient documentation

## 2021-05-05 MED ORDER — KETOROLAC TROMETHAMINE 60 MG/2ML IM SOLN
60.0000 mg | Freq: Once | INTRAMUSCULAR | Status: AC
  Administered 2021-05-05: 60 mg via INTRAMUSCULAR

## 2021-05-05 MED ORDER — CYCLOBENZAPRINE HCL 5 MG PO TABS: 5.0000 mg | ORAL_TABLET | Freq: Three times a day (TID) | ORAL | 0 refills | Status: DC | PRN

## 2021-05-05 NOTE — Progress Notes (Signed)
Subjective:    Patient ID: Nathan Camacho, male    DOB: [birth year], 55 y.o.   MRN: [id number]  HPI  [name] is a very pleasant 55 y.o. male patient of Dr. Danise Mina with a history of plantar fascitis, severe obesity, left knee pain who presents today to discuss thoracic back pain.  His pain is located to the left upper thoracic back (mid scapular region) which began about three weeks ago, mostly a tense feeling. Yesterday he noticed radiation of pain to left shoulder to upper humerus region and increased pain to the thoracic back with neck movements. Symptoms are worse with turning his neck and when laying down at night.  [name] applied a heating pad, taking Aleve BID, and applying Voltaren Gel without improvement.   He denies injury/trauma, numbness/tingling, rashes.      Review of Systems  Musculoskeletal:  Positive for myalgias.  Skin:  Negative for rash.  Neurological:  Negative for numbness.        Past Medical History:  Diagnosis Date   Adult ADHD 08/24/2014   S/p eval by our psychologist Dr Lurline Hare - consistent with ADHD but mild case currently.     Allergy     Social History   Socioeconomic History   Marital status: Married    Spouse name: Not on file   Number of children: Not on file   Years of education: Not on file   Highest education level: Not on file  Occupational History   Not on file  Tobacco Use   Smoking status: Former    Types: Cigarettes    Quit date: 12/17/1993    Years since quitting: 27.4   Smokeless tobacco: Never  Vaping Use   Vaping Use: Never used  Substance and Sexual Activity   Alcohol use: Yes    Comment: social    Drug use: No   Sexual activity: Not on file  Other Topics Concern   Not on file  Social History Narrative   Lives with wife, no pets   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)    Activity: no regular exercise   Diet: good water, fruits/vegetables some    Social Determinants  of Health   Financial Resource Strain: Not on file  Food Insecurity: Not on file  Transportation Needs: Not on file  Physical Activity: Not on file  Stress: Not on file  Social Connections: Not on file  Intimate Partner Violence: Not on file    Past Surgical History:  Procedure Laterality Date   COLONOSCOPY  05/2019   TAx2, diverticulosis, rpt 5 yrs ([facility])   MENISCUS REPAIR Left 01/2019   [facility]   WISDOM TOOTH EXTRACTION     not sedated     Family History  Problem Relation Age of Onset   Crohn's disease Mother    Atrial fibrillation Mother    Hypertension Mother    COPD Father        smoker   Cancer Father 60       anal   Rectal cancer Father 28   Cancer Paternal Grandfather        stomach   Stomach cancer Paternal Grandfather    Colon polyps Neg Hx    Colon cancer Neg Hx    Esophageal cancer Neg Hx     Allergies  Allergen Reactions   Bee Venom     Current Outpatient Medications on File Prior to Visit  Medication Sig Dispense Refill  amphetamine-dextroamphetamine (ADDERALL XR) 25 MG 24 hr capsule Take 1 capsule by mouth every morning. 30 capsule 0   Insulin Pen Needle (B-D ULTRAFINE III SHORT PEN) 31G X 8 MM MISC Use as instructed to administer medication daily. 100 each 0   Liraglutide -Weight Management (SAXENDA) 18 MG/3ML SOPN INJECT 3 MG INTO THE SKIN DAILY. 15 mL 1   Multiple Vitamin (MULTIVITAMIN) tablet Take 1 tablet by mouth daily.     Omega-3 Fatty Acids (FISH OIL) 1000 MG CAPS Take 1 capsule (1,000 mg total) by mouth daily.     fexofenadine (ALLEGRA) 180 MG tablet Take 1 tablet (180 mg total) by mouth daily. (Patient not taking: Reported on 05/05/2021) 30 tablet 11   fluticasone (FLONASE) 50 MCG/ACT nasal spray Place 2 sprays into both nostrils daily. (Patient not taking: No sig reported) 16 g 11   Glucosamine-Chondroit-Vit C-Mn (GLUCOSAMINE 1500 COMPLEX) CAPS daily. (Patient not taking: No sig reported)     No current facility-administered  medications on file prior to visit.    BP 126/84   Pulse 76   Temp 98.6 F (37 C) (Temporal)   Ht 5' 11.5" (1.816 m)   Wt 268 lb (121.6 kg)   SpO2 97%   BMI 36.86 kg/m  Objective:   Physical Exam Musculoskeletal:     Cervical back: Pain with movement present. Normal range of motion.     Thoracic back: No tenderness or bony tenderness. Normal range of motion.       Back:     Comments: Pain to left upper thoracic back with ROM to neck. No decrease in ROM to neck, mostly movement pain to left thoracic back.  Skin:    General: Skin is warm and dry.     Findings: No rash.  Neurological:     Mental Status: He is alert.          Assessment & Plan:      This visit occurred during the SARS-CoV-2 public health emergency.  Safety protocols were in place, including screening questions prior to the visit, additional usage of staff PPE, and extensive cleaning of exam room while observing appropriate contact time as indicated for disinfecting solutions.

## 2021-05-05 NOTE — Patient Instructions (Signed)
You may take cyclobenzaprine (muscle relaxer) 5 mg every 8 hours as needed for muscle spasms. This may cause drowsiness.   Remember to stretch as discussed.  It was a pleasure meeting you!

## 2021-05-05 NOTE — Assessment & Plan Note (Signed)
Appears to be MSK in etiology, especially given movement pain and HPI. No nerve involvement at this point. No signs of herpes zoster.   Discussed stretching, ice/heat. Rx for Flexeril 5 mg TID PRN sent to pharmacy. Drowsiness precautions provided.  IM Toradol 60 mg provided today.  He will see his PCP next week for routine follow up, he will update then.

## 2021-05-07 ENCOUNTER — Other Ambulatory Visit: Payer: Self-pay | Admitting: Family Medicine

## 2021-05-10 ENCOUNTER — Other Ambulatory Visit: Payer: Self-pay

## 2021-05-10 ENCOUNTER — Ambulatory Visit: Payer: BC Managed Care – PPO | Admitting: Family Medicine

## 2021-05-10 ENCOUNTER — Encounter: Payer: Self-pay | Admitting: Family Medicine

## 2021-05-10 DIAGNOSIS — Z23 Encounter for immunization: Secondary | ICD-10-CM

## 2021-05-10 DIAGNOSIS — F909 Attention-deficit hyperactivity disorder, unspecified type: Secondary | ICD-10-CM

## 2021-05-10 DIAGNOSIS — M546 Pain in thoracic spine: Secondary | ICD-10-CM | POA: Diagnosis not present

## 2021-05-10 NOTE — Assessment & Plan Note (Signed)
Weight loss remains plateaued. He has had decreased activity recently with COVID infection the thoracic muscle strain. He is planning on incorporating calorie counting/tracking meals more regularly, as well as will continue working on increased walking on weekdays as well as on weekends. RTC 6-8 wks f/u visit.

## 2021-05-10 NOTE — Assessment & Plan Note (Signed)
Continues adderall XL 20mg  daily.

## 2021-05-10 NOTE — Patient Instructions (Addendum)
Flu shot today  Good to see you today  Continue saxenda, return in 6-8 weeks for follow up visit

## 2021-05-10 NOTE — Assessment & Plan Note (Signed)
This continues improving with conservative management.

## 2021-05-10 NOTE — Progress Notes (Signed)
Patient ID: Nathan Camacho, male    DOB: 08-Apr-1966, 55 y.o.   MRN: [id number]  This visit was conducted in person.  BP 116/74   Pulse 89   Temp 98.1 F (36.7 C) (Temporal)   Ht 5' 11.5" (1.816 m)   Wt 264 lb (119.7 kg)   SpO2 98%   BMI 36.31 kg/m    CC: 6 wk f/u visit  Subjective:   HPI: Nathan Camacho is a 55 y.o. male presenting on 05/10/2021 for Weight Management (Here for 6 wk f/u.)   COVID infection 04/2021, symptoms fully resolved.  Seen last week for acute thoracic pain - treated for MSK cause with flexeril muscle relaxant as well as toradol 60mg  IM. This is slowly improving, notes ongoing discomfort. He just got new neck massager.   Starting weight: 299 lbs Last weight: 266 lbs Today's weight 264 lbs  Medication: saxenda 3mg  daily started 09/2020. Tolerating medication well without constipation, nausea, vomiting, epigastric pain.   24 hour recall: Not done  [name] planning to start calorie count with fit bit  Activity regimen: New fit bit - this can track his walking  [name] started walking more during lunch breaks on week days.      Relevant past medical, surgical, family and social history reviewed and updated as indicated. Interim medical history since our last visit reviewed. Allergies and medications reviewed and updated. Outpatient Medications Prior to Visit  Medication Sig Dispense Refill   amphetamine-dextroamphetamine (ADDERALL XR) 25 MG 24 hr capsule Take 1 capsule by mouth every morning. 30 capsule 0   fexofenadine (ALLEGRA) 180 MG tablet Take 1 tablet (180 mg total) by mouth daily. (Patient taking differently: Take 180 mg by mouth daily. Seasonally as needed) 30 tablet 11   fluticasone (FLONASE) 50 MCG/ACT nasal spray Place 2 sprays into both nostrils daily. (Patient taking differently: Place 2 sprays into both nostrils daily. Seasonally as needed) 16 g 11   Glucosamine-Chondroit-Vit C-Mn (GLUCOSAMINE 1500 COMPLEX) CAPS daily.     Insulin Pen Needle  (B-D ULTRAFINE III SHORT PEN) 31G X 8 MM MISC Use as instructed to administer medication daily. 100 each 0   Multiple Vitamin (MULTIVITAMIN) tablet Take 1 tablet by mouth daily.     Omega-3 Fatty Acids (FISH OIL) 1000 MG CAPS Take 1 capsule (1,000 mg total) by mouth daily.     SAXENDA 18 MG/3ML SOPN INJECT 3 MG INTO THE SKIN DAILY. 15 mL 0   cyclobenzaprine (FLEXERIL) 5 MG tablet Take 1 tablet (5 mg total) by mouth 3 (three) times daily as needed for muscle spasms. 15 tablet 0   No facility-administered medications prior to visit.     Per HPI unless specifically indicated in ROS section below Review of Systems  Objective:  BP 116/74   Pulse 89   Temp 98.1 F (36.7 C) (Temporal)   Ht 5' 11.5" (1.816 m)   Wt 264 lb (119.7 kg)   SpO2 98%   BMI 36.31 kg/m   Wt Readings from Last 3 Encounters:  05/10/21 264 lb (119.7 kg)  05/05/21 268 lb (121.6 kg)  03/29/21 266 lb (120.7 kg)      Physical Exam Vitals and nursing note reviewed.  Constitutional:      Appearance: Normal appearance. He is not ill-appearing.  Cardiovascular:     Rate and Rhythm: Normal rate and regular rhythm.     Pulses: Normal pulses.     Heart sounds: Normal heart sounds. No murmur heard. Pulmonary:  Effort: Pulmonary effort is normal. No respiratory distress.     Breath sounds: Normal breath sounds. No wheezing, rhonchi or rales.  Musculoskeletal:     Cervical back: Normal range of motion and neck supple.     Comments:  No midline cervical spine tenderness nor paracervical mm tenderness  FROM at cervical neck No reproducible pain at rhomboids or trapezius mm  Skin:    General: Skin is warm and dry.  Neurological:     Mental Status: He is alert.  Psychiatric:        Mood and Affect: Mood normal.        Behavior: Behavior normal.       Assessment & Plan:  This visit occurred during the SARS-CoV-2 public health emergency.  Safety protocols were in place, including screening questions prior to the  visit, additional usage of staff PPE, and extensive cleaning of exam room while observing appropriate contact time as indicated for disinfecting solutions.   Problem List Items Addressed This Visit     Severe obesity (BMI 35.0-39.9) with comorbidity ([place]) - Primary    Weight loss remains plateaued. He has had decreased activity recently with COVID infection the thoracic muscle strain. He is planning on incorporating calorie counting/tracking meals more regularly, as well as will continue working on increased walking on weekdays as well as on weekends. RTC 6-8 wks f/u visit.       Adult ADHD    Continues adderall XL 20mg  daily.       Acute thoracic back pain    This continues improving with conservative management.       Other Visit Diagnoses     Need for influenza vaccination       Relevant Orders   Flu Vaccine QUAD 110mo+IM (Fluarix, Fluzone & Alfiuria Quad PF) (Completed)        No orders of the defined types were placed in this encounter.  Orders Placed This Encounter  Procedures   Flu Vaccine QUAD 60mo+IM (Fluarix, Fluzone & Alfiuria Quad PF)     Patient Instructions  Flu shot today  Good to see you today  Continue saxenda, return in 6-8 weeks for follow up visit  Follow up plan: Return in about 6 weeks (around 06/21/2021), or if symptoms worsen or fail to improve, for follow up visit.  Ria Bush, MD

## 2021-06-01 ENCOUNTER — Other Ambulatory Visit: Payer: Self-pay | Admitting: Family Medicine

## 2021-06-01 NOTE — Telephone Encounter (Signed)
Name of Medication: Adderall Name of Pharmacy: [facility] Last Fill or Written Date and Quantity: 04/2621 #30 Last Office Visit and Type: 05/10/21 weight management Next Office Visit and Type: 06/19/21 6 week follow up Last Controlled Substance Agreement Date: 08/17/19 Last UDS:08/17/19

## 2021-06-02 MED ORDER — AMPHETAMINE-DEXTROAMPHET ER 25 MG PO CP24: 25.0000 mg | ORAL_CAPSULE | ORAL | 0 refills | Status: DC

## 2021-06-02 NOTE — Telephone Encounter (Signed)
ERx 

## 2021-06-05 ENCOUNTER — Other Ambulatory Visit: Payer: Self-pay | Admitting: Family Medicine

## 2021-06-05 NOTE — Telephone Encounter (Signed)
ERx 

## 2021-06-19 ENCOUNTER — Ambulatory Visit: Payer: BC Managed Care – PPO | Admitting: Family Medicine

## 2021-06-19 ENCOUNTER — Other Ambulatory Visit: Payer: Self-pay

## 2021-06-19 ENCOUNTER — Encounter: Payer: Self-pay | Admitting: Family Medicine

## 2021-06-19 MED ORDER — WEGOVY 0.25 MG/0.5ML ~~LOC~~ SOAJ: 0.2500 mg | SUBCUTANEOUS | 0 refills | Status: DC

## 2021-06-19 NOTE — Progress Notes (Signed)
Patient ID: Nathan Camacho, male    DOB: 04-Apr-1966, 55 y.o.   MRN: [id number]  This visit was conducted in person.  BP 132/82   Pulse 73   Temp 97.8 F (36.6 C) (Temporal)   Ht 5' 11.5" (1.816 m)   Wt 262 lb (118.8 kg)   SpO2 97%   BMI 36.03 kg/m    CC: 6 wk weight f/u visit  Subjective:   HPI: Nathan Camacho is a 55 y.o. male presenting on 06/19/2021 for Follow-up (Here for 6 wk wt mgmt f/u.)   Starting weight: 299 lbs Last weight: 264 lbs Today's weight 262 lbs  Medication: Saxenda 3mg  daily started 09/2020. Tolerates med well without side effects (nausea, abd pain, constipation, diarrhea).   Frustrated about slow rate of weight loss. Notes appetite returning. Tries to choose healthy options.   24 hour recall: Some calorie counting using fit bit.   Activity regimen: Using fit bit to track walking.  Not as active as he'd like to be.      Relevant past medical, surgical, family and social history reviewed and updated as indicated. Interim medical history since our last visit reviewed. Allergies and medications reviewed and updated. Outpatient Medications Prior to Visit  Medication Sig Dispense Refill   amphetamine-dextroamphetamine (ADDERALL XR) 25 MG 24 hr capsule Take 1 capsule by mouth every morning. 30 capsule 0   fexofenadine (ALLEGRA) 180 MG tablet Take 1 tablet (180 mg total) by mouth daily. (Patient taking differently: Take 180 mg by mouth daily. Seasonally as needed) 30 tablet 11   fluticasone (FLONASE) 50 MCG/ACT nasal spray Place 2 sprays into both nostrils daily. (Patient taking differently: Place 2 sprays into both nostrils daily. Seasonally as needed) 16 g 11   Glucosamine-Chondroit-Vit C-Mn (GLUCOSAMINE 1500 COMPLEX) CAPS daily.     Insulin Pen Needle (B-D ULTRAFINE III SHORT PEN) 31G X 8 MM MISC Use as instructed to administer medication daily. 100 each 0   Multiple Vitamin (MULTIVITAMIN) tablet Take 1 tablet by mouth daily.     Omega-3 Fatty Acids  (FISH OIL) 1000 MG CAPS Take 1 capsule (1,000 mg total) by mouth daily.     SAXENDA 18 MG/3ML SOPN INJECT 3 MG INTO THE SKIN DAILY. 15 mL 0   No facility-administered medications prior to visit.     Per HPI unless specifically indicated in ROS section below Review of Systems  Objective:  BP 132/82   Pulse 73   Temp 97.8 F (36.6 C) (Temporal)   Ht 5' 11.5" (1.816 m)   Wt 262 lb (118.8 kg)   SpO2 97%   BMI 36.03 kg/m   Wt Readings from Last 3 Encounters:  06/19/21 262 lb (118.8 kg)  05/10/21 264 lb (119.7 kg)  05/05/21 268 lb (121.6 kg)      Physical Exam Vitals and nursing note reviewed.  Constitutional:      Appearance: Normal appearance. He is obese. He is not ill-appearing.  Neck:     Thyroid: No thyroid mass or thyromegaly.  Cardiovascular:     Rate and Rhythm: Normal rate and regular rhythm.     Pulses: Normal pulses.     Heart sounds: Normal heart sounds. No murmur heard. Pulmonary:     Effort: Pulmonary effort is normal. No respiratory distress.     Breath sounds: Normal breath sounds. No wheezing, rhonchi or rales.  Musculoskeletal:     Right lower leg: No edema.     Left lower leg: No edema.  Neurological:     Mental Status: He is alert.  Psychiatric:        Mood and Affect: Mood normal.        Behavior: Behavior normal.       Assessment & Plan:  This visit occurred during the SARS-CoV-2 public health emergency.  Safety protocols were in place, including screening questions prior to the visit, additional usage of staff PPE, and extensive cleaning of exam room while observing appropriate contact time as indicated for disinfecting solutions.   Problem List Items Addressed This Visit     Severe obesity (BMI 35.0-39.9) with comorbidity ([place]) - Primary    Weight loss plateau persists.  Will price out wegovy to transition to weekly injections, see if available now/affordable.  Discussed option of Mounjaro once approved by FDA for weight loss indication.        Relevant Medications   Semaglutide-Weight Management (WEGOVY) 0.25 MG/0.5ML SOAJ     Meds ordered this encounter  Medications   Semaglutide-Weight Management (WEGOVY) 0.25 MG/0.5ML SOAJ    Sig: Inject 0.25 mg into the skin once a week.    Dispense:  2 mL    Refill:  0   No orders of the defined types were placed in this encounter.    Patient Instructions  You are doing well today Continue Saxenda 3mg  daily for now. We will price out wegovy (and see if available now).  Work on walking routine.  Return as needed or in 6 weeks for weight follow up visit.   Follow up plan: Return in about 6 weeks (around 07/31/2021), or if symptoms worsen or fail to improve, for follow up visit.  Ria Bush, MD

## 2021-06-19 NOTE — Patient Instructions (Addendum)
You are doing well today Continue Saxenda 3mg  daily for now. We will price out wegovy (and see if available now).  Work on walking routine.  Return as needed or in 6 weeks for weight follow up visit.

## 2021-06-19 NOTE — Assessment & Plan Note (Signed)
Weight loss plateau persists.  Will price out wegovy to transition to weekly injections, see if available now/affordable.  Discussed option of Mounjaro once approved by FDA for weight loss indication.

## 2021-07-03 ENCOUNTER — Encounter: Payer: Self-pay | Admitting: Family Medicine

## 2021-07-03 ENCOUNTER — Other Ambulatory Visit: Payer: Self-pay | Admitting: Family Medicine

## 2021-07-04 ENCOUNTER — Telehealth: Payer: Self-pay

## 2021-07-04 NOTE — Telephone Encounter (Signed)
Name of Medication: Adderall XR Name of Pharmacy: [facility] Last Fill or Written Date and Quantity: 06/02/21, #30 Last Office Visit and Type: 06/19/21, wt mgmt f/u Next Office Visit and Type: 08/09/21, 6 wk wt mgmt f/u Last Controlled Substance Agreement Date: 08/17/19 Last UDS: 08/17/19

## 2021-07-04 NOTE — Telephone Encounter (Signed)
Received faxed PA request from [facility] for Wegovy 0.25 mg/0.5 mL SOAJ.  Submitted PA; key:  [id number].  Decision pending.

## 2021-07-05 MED ORDER — AMPHETAMINE-DEXTROAMPHET ER 25 MG PO CP24: 25.0000 mg | ORAL_CAPSULE | ORAL | 0 refills | Status: DC

## 2021-07-05 NOTE — Telephone Encounter (Signed)
ERx 

## 2021-07-05 NOTE — Telephone Encounter (Signed)
Awaiting insurance decision on Wegovy. Will refill saxenda for now.

## 2021-07-06 NOTE — Telephone Encounter (Addendum)
Received faxed PA approval, valid 07/04/2021-02/01/2022. Made pharmacy aware.  States it's on order.  As soon as it comes in, they will fill it.  Notified pt via MyChart (see Pt Msg, 07/03/21).

## 2021-07-31 ENCOUNTER — Other Ambulatory Visit: Payer: Self-pay | Admitting: Family Medicine

## 2021-08-02 ENCOUNTER — Other Ambulatory Visit: Payer: Self-pay | Admitting: Family Medicine

## 2021-08-02 ENCOUNTER — Encounter: Payer: Self-pay | Admitting: Family Medicine

## 2021-08-02 MED ORDER — AMPHETAMINE-DEXTROAMPHET ER 25 MG PO CP24: 25.0000 mg | ORAL_CAPSULE | ORAL | 0 refills | Status: DC

## 2021-08-02 NOTE — Telephone Encounter (Signed)
ERx 

## 2021-08-02 NOTE — Telephone Encounter (Signed)
Name of Medication: Adderall XR Name of Pharmacy: [facility] Last Fill or Written Date and Quantity: 07/05/21, #30 Last Office Visit and Type: 06/19/21, wt mgmt f/u Next Office Visit and Type: 08/09/21, 6 wk wt mgmt f/u Last Controlled Substance Agreement Date: 08/17/19 Last UDS: 08/17/19

## 2021-08-09 ENCOUNTER — Ambulatory Visit: Payer: BC Managed Care – PPO | Admitting: Family Medicine

## 2021-08-31 ENCOUNTER — Encounter: Payer: Self-pay | Admitting: Family Medicine

## 2021-09-03 ENCOUNTER — Other Ambulatory Visit: Payer: Self-pay | Admitting: Family Medicine

## 2021-09-05 ENCOUNTER — Other Ambulatory Visit: Payer: Self-pay | Admitting: Family Medicine

## 2021-09-05 NOTE — Telephone Encounter (Signed)
Name of Medication: Adderall Name of Pharmacy: [facility] Last Fill or Written Date and Quantity: 08/02/21 #30 Last Office Visit and Type: 06/19/21 Next Office Visit and Type: 10/03/21 Last Controlled Substance Agreement Date: 08/17/19 Last UDS:08/17/19

## 2021-09-06 MED ORDER — AMPHETAMINE-DEXTROAMPHET ER 25 MG PO CP24: 25.0000 mg | ORAL_CAPSULE | ORAL | 0 refills | Status: DC

## 2021-09-06 NOTE — Telephone Encounter (Signed)
ERx 

## 2021-09-14 ENCOUNTER — Other Ambulatory Visit: Payer: Self-pay | Admitting: Family Medicine

## 2021-09-14 DIAGNOSIS — E785 Hyperlipidemia, unspecified: Secondary | ICD-10-CM

## 2021-09-14 DIAGNOSIS — Z125 Encounter for screening for malignant neoplasm of prostate: Secondary | ICD-10-CM

## 2021-09-15 ENCOUNTER — Other Ambulatory Visit: Payer: Self-pay | Admitting: Family Medicine

## 2021-09-15 NOTE — Telephone Encounter (Signed)
Message from pt via pharmacy: All is going well. I am requesting the next doseage of Wegovy, which is .50 MG i think. I am being proactive incase it is difficult to get filled.  Thank you  Wegovy 0.25 mg Last rx:  06/19/21, #2 mL Last OV: 06/19/21, wt mgmt f/u Next OV:  10/03/21,  CPE

## 2021-09-16 MED ORDER — WEGOVY 0.5 MG/0.5ML ~~LOC~~ SOAJ: 0.5000 mg | SUBCUTANEOUS | 0 refills | Status: DC

## 2021-09-16 NOTE — Telephone Encounter (Signed)
DCVUDT 0.5mg  sent to pharmacy.

## 2021-09-26 ENCOUNTER — Other Ambulatory Visit: Payer: Self-pay

## 2021-09-26 ENCOUNTER — Other Ambulatory Visit (INDEPENDENT_AMBULATORY_CARE_PROVIDER_SITE_OTHER): Payer: BC Managed Care – PPO

## 2021-09-26 DIAGNOSIS — Z125 Encounter for screening for malignant neoplasm of prostate: Secondary | ICD-10-CM

## 2021-09-26 DIAGNOSIS — E785 Hyperlipidemia, unspecified: Secondary | ICD-10-CM | POA: Diagnosis not present

## 2021-09-26 LAB — COMPREHENSIVE METABOLIC PANEL
ALT: 23 U/L (ref 0–53)
AST: 19 U/L (ref 0–37)
Albumin: 4.5 g/dL (ref 3.5–5.2)
Alkaline Phosphatase: 63 U/L (ref 39–117)
BUN: 20 mg/dL (ref 6–23)
CO2: 30 mEq/L (ref 19–32)
Calcium: 9.4 mg/dL (ref 8.4–10.5)
Chloride: 105 mEq/L (ref 96–112)
Creatinine, Ser: 1.02 mg/dL (ref 0.40–1.50)
GFR: 83.72 mL/min (ref >60.00)
Glucose, Bld: 93 mg/dL (ref 70–99)
Potassium: 4.3 mEq/L (ref 3.5–5.1)
Sodium: 139 mEq/L (ref 135–145)
Total Bilirubin: 0.8 mg/dL (ref 0.2–1.2)
Total Protein: 7 g/dL (ref 6.0–8.3)

## 2021-09-26 LAB — LIPID PANEL
Cholesterol: 200 mg/dL (ref 0–200)
HDL: 47.2 mg/dL (ref >39.00)
LDL Cholesterol: 134 mg/dL — ABNORMAL HIGH (ref 0–99)
NonHDL: 152.42
Total CHOL/HDL Ratio: 4
Triglycerides: 94 mg/dL (ref 0.0–149.0)
VLDL: 18.8 mg/dL (ref 0.0–40.0)

## 2021-09-26 LAB — PSA: PSA: 0.35 ng/mL (ref 0.10–4.00)

## 2021-10-03 ENCOUNTER — Encounter: Payer: Self-pay | Admitting: Family Medicine

## 2021-10-03 ENCOUNTER — Ambulatory Visit (INDEPENDENT_AMBULATORY_CARE_PROVIDER_SITE_OTHER): Payer: BC Managed Care – PPO | Admitting: Family Medicine

## 2021-10-03 ENCOUNTER — Other Ambulatory Visit: Payer: Self-pay

## 2021-10-03 VITALS — BP 120/80 | HR 83 | Temp 97.7°F | Ht 71.0 in | Wt 260.2 lb

## 2021-10-03 DIAGNOSIS — E785 Hyperlipidemia, unspecified: Secondary | ICD-10-CM

## 2021-10-03 DIAGNOSIS — Z0000 Encounter for general adult medical examination without abnormal findings: Secondary | ICD-10-CM

## 2021-10-03 DIAGNOSIS — F909 Attention-deficit hyperactivity disorder, unspecified type: Secondary | ICD-10-CM

## 2021-10-03 DIAGNOSIS — M25561 Pain in right knee: Secondary | ICD-10-CM | POA: Insufficient documentation

## 2021-10-03 MED ORDER — SCOPOLAMINE 1 MG/3DAYS TD PT72: 1.0000 | MEDICATED_PATCH | TRANSDERMAL | 1 refills | Status: DC

## 2021-10-03 MED ORDER — AMPHETAMINE-DEXTROAMPHET ER 25 MG PO CP24: 25.0000 mg | ORAL_CAPSULE | ORAL | 0 refills | Status: DC

## 2021-10-03 NOTE — Progress Notes (Signed)
Patient ID: Nathan Camacho, male    DOB: 1965-12-08, 56 y.o.   MRN: [id number]  This visit was conducted in person.  BP 120/80    Pulse 83    Temp 97.7 F (36.5 C) (Temporal)    Ht 5\' 11"  (1.803 m)    Wt 260 lb 3 oz (118 kg)    SpO2 96%    BMI 36.29 kg/m    CC: CPE Subjective:   HPI: Nathan Camacho is a 56 y.o. male presenting on 10/03/2021 for Annual Exam   Recently saw ortho for R knee pain s/p steroid injection. Ongoing pain, planning to return to ortho. Notes R knee pops when going up stairs. Started after crawling under his house.   Obesity - saxenda started 09/2020. Recent transition from saxenda to wegovy - currently on 0.25mg  weekly with plan to titrate as tolerated. He will start 0.5mg  dose next week.   Starting weight: 299 lbs  Last weight: 262 lbs  Today's weight 260 lbs   Had COVID a few months back. Considering bivalent booster.  Upcoming [place] cruise - requests anti nausea medicine.   Preventative: COLONOSCOPY 05/2019 - TAx2, diverticulosis, rpt 5 yrs (Nandigam)  Prostate cancer screening - yearly PSA  Lung cancer screening - not eligible  Flu shot yearly  [place] 11/2019, 12/2019, booster 06/2020   Td 2008. Tdap 07/2017  Zostavax - 08/2019, 01/2020 Seat belt use discussed Sunscreen use discussed. No changing moles on skin.  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 Interim medical history since our last visit reviewed. Allergies and medications reviewed and updated. Outpatient Medications Prior to Visit  Medication Sig  Dispense Refill   fexofenadine (ALLEGRA) 180 MG tablet Take 1 tablet (180 mg total) by mouth daily. (Patient taking differently: Take 180 mg by mouth daily. Seasonally as needed) 30 tablet 11   fluticasone (FLONASE) 50 MCG/ACT nasal spray Place 2 sprays into both nostrils daily. (Patient taking differently: Place 2 sprays into both nostrils daily. Seasonally as needed) 16 g 11   Glucosamine-Chondroit-Vit C-Mn (GLUCOSAMINE 1500 COMPLEX) CAPS daily.     Multiple Vitamin (MULTIVITAMIN) tablet Take 1 tablet by mouth daily.     Omega-3 Fatty Acids (FISH OIL) 1000 MG CAPS Take 1 capsule (1,000 mg total) by mouth daily.     Semaglutide-Weight Management (WEGOVY) 0.5 MG/0.5ML SOAJ Inject 0.5 mg into the skin once a week. 2 mL 0   amphetamine-dextroamphetamine (ADDERALL XR) 25 MG 24 hr capsule Take 1 capsule by mouth every morning. 30 capsule 0   B-D ULTRAFINE III SHORT PEN 31G X 8 MM MISC USE AS INSTRUCTED TO ADMINISTER MEDICATION DAILY. 100 each 0   SAXENDA 18 MG/3ML SOPN INJECT 3 MG INTO THE SKIN DAILY. 15 mL 0   No facility-administered medications prior to visit.     Per HPI unless specifically indicated in ROS section below Review of Systems  Constitutional:  Negative for activity change, appetite change, chills, fatigue, fever and unexpected weight change.  HENT:  Negative for hearing loss.   Eyes:  Negative for  visual disturbance.  Respiratory:  Negative for cough, chest tightness, shortness of breath and wheezing.   Cardiovascular:  Negative for chest pain, palpitations and leg swelling.  Gastrointestinal:  Positive for constipation and nausea. Negative for abdominal distention, abdominal pain, blood in stool, diarrhea and vomiting.       ?med related  Genitourinary:  Negative for difficulty urinating and hematuria.  Musculoskeletal:  Negative for arthralgias, myalgias and neck pain.  Skin:  Negative for rash.  Neurological:  Negative for dizziness, seizures, syncope and headaches.   Hematological:  Negative for adenopathy. Does not bruise/bleed easily.  Psychiatric/Behavioral:  Negative for dysphoric mood. The patient is not nervous/anxious.    Objective:  BP 120/80    Pulse 83    Temp 97.7 F (36.5 C) (Temporal)    Ht 5\' 11"  (1.803 m)    Wt 260 lb 3 oz (118 kg)    SpO2 96%    BMI 36.29 kg/m   Wt Readings from Last 3 Encounters:  10/03/21 260 lb 3 oz (118 kg)  06/19/21 262 lb (118.8 kg)  05/10/21 264 lb (119.7 kg)      Physical Exam Vitals and nursing note reviewed.  Constitutional:      General: He is not in acute distress.    Appearance: Normal appearance. He is well-developed. He is not ill-appearing.  HENT:     Head: Normocephalic and atraumatic.     Right Ear: Hearing, tympanic membrane, ear canal and external ear normal.     Left Ear: Hearing, tympanic membrane, ear canal and external ear normal.  Eyes:     General: No scleral icterus.    Extraocular Movements: Extraocular movements intact.     Conjunctiva/sclera: Conjunctivae normal.     Pupils: Pupils are equal, round, and reactive to light.  Neck:     Thyroid: No thyroid mass or thyromegaly.  Cardiovascular:     Rate and Rhythm: Normal rate and regular rhythm.     Pulses: Normal pulses.          Radial pulses are 2+ on the right side and 2+ on the left side.     Heart sounds: Normal heart sounds. No murmur heard. Pulmonary:     Effort: Pulmonary effort is normal. No respiratory distress.     Breath sounds: Normal breath sounds. No wheezing, rhonchi or rales.  Abdominal:     General: Bowel sounds are normal. There is no distension.     Palpations: Abdomen is soft. There is no mass.     Tenderness: There is no abdominal tenderness. There is no guarding or rebound.     Hernia: No hernia is present.  Musculoskeletal:        General: Normal range of motion.     Cervical back: Normal range of motion and neck supple.     Right lower leg: No edema.     Left lower leg: No edema.     Comments:  L  knee WNL R knee exam: Popping seen and heard with flexion/extension at knee No deformity on inspection. No pain with palpation of knee landmarks. No effusion/swelling noted. FROM in flex/extension without crepitus. No popliteal fullness. Neg drawer test. Neg mcmurray test. No pain with valgus/varus stress. No PFgrind. No abnormal patellar mobility.   Lymphadenopathy:     Cervical: No cervical adenopathy.  Skin:    General: Skin is warm and dry.     Findings: No rash.  Neurological:     General: No focal deficit present.  Mental Status: He is alert and oriented to person, place, and time.  Psychiatric:        Mood and Affect: Mood normal.        Behavior: Behavior normal.        Thought Content: Thought content normal.        Judgment: Judgment normal.      Results for orders placed or performed in visit on 09/26/21  PSA  Result Value Ref Range   PSA 0.35 0.10 - 4.00 ng/mL  Comprehensive metabolic panel  Result Value Ref Range   Sodium 139 135 - 145 mEq/L   Potassium 4.3 3.5 - 5.1 mEq/L   Chloride 105 96 - 112 mEq/L   CO2 30 19 - 32 mEq/L   Glucose, Bld 93 70 - 99 mg/dL   BUN 20 6 - 23 mg/dL   Creatinine, Ser 1.02 0.40 - 1.50 mg/dL   Total Bilirubin 0.8 0.2 - 1.2 mg/dL   Alkaline Phosphatase 63 39 - 117 U/L   AST 19 0 - 37 U/L   ALT 23 0 - 53 U/L   Total Protein 7.0 6.0 - 8.3 g/dL   Albumin 4.5 3.5 - 5.2 g/dL   GFR 83.72 >60.00 mL/min   Calcium 9.4 8.4 - 10.5 mg/dL  Lipid panel  Result Value Ref Range   Cholesterol 200 0 - 200 mg/dL   Triglycerides 94.0 0.0 - 149.0 mg/dL   HDL 47.20 >39.00 mg/dL   VLDL 18.8 0.0 - 40.0 mg/dL   LDL Cholesterol 134 (H) 0 - 99 mg/dL   Total CHOL/HDL Ratio 4    NonHDL 152.42     Assessment & Plan:  This visit occurred during the SARS-CoV-2 public health emergency.  Safety protocols were in place, including screening questions prior to the visit, additional usage of staff PPE, and extensive cleaning of exam room while  observing appropriate contact time as indicated for disinfecting solutions.   Problem List Items Addressed This Visit     Health maintenance examination - Primary (Chronic)    Preventative protocols reviewed and updated unless pt declined. Discussed healthy diet and lifestyle.       Severe obesity (BMI 35.0-39.9) with comorbidity ([place])    Encouraged ongoing healthy diet and lifestyle choices to affect sustainable weight loss.  Overall tolerating low dose wegovy - will continue titration - he will start 0.5mg  weekly next week. Update via MyChart with effect/tolerability.  RTC 3 mo weight management visit.       Relevant Medications   amphetamine-dextroamphetamine (ADDERALL XR) 25 MG 24 hr capsule   Adult ADHD    Stable period on Adderall XL 20mg  daily.  Refilled today.       Dyslipidemia    Chronic, off meds only on low dose fish oil. Reviewed diet choices to improve LDL control.  The 10-year ASCVD risk score (Arnett DK, et al., 2019) is: 4.4%   Values used to calculate the score:     Age: 24 years     Sex: Male     Is Non-Hispanic African American: No     Diabetic: No     Tobacco smoker: No     Systolic Blood Pressure: 734 mmHg     Is BP treated: No     HDL Cholesterol: 47.2 mg/dL     Total Cholesterol: 200 mg/dL       Right medial knee pain    Overall reassuring exam. However description and location of pain suspicious for pes anserine bursitis.  Provided with exercises from [facility] pt advisor.         Meds ordered this encounter  Medications   scopolamine (TRANSDERM-SCOP) 1 MG/3DAYS    Sig: Place 1 patch (1.5 mg total) onto the skin every 3 (three) days.    Dispense:  10 patch    Refill:  1   amphetamine-dextroamphetamine (ADDERALL XR) 25 MG 24 hr capsule    Sig: Take 1 capsule by mouth every morning.    Dispense:  30 capsule    Refill:  0   No orders of the defined types were placed in this encounter.   Patient instructions: You are doing well today Increase  wegovy to 0.5mg  weekly. Update [place] with effect.  You may try scopolamine patches sent to pharmacy.  Try knee exercises provided today.  Return as needed or in 3 months for weight management visit.   Follow up plan: Return in about 3 months (around 12/31/2021) for follow up visit.  Ria Bush, MD

## 2021-10-03 NOTE — Assessment & Plan Note (Signed)
Chronic, off meds only on low dose fish oil. Reviewed diet choices to improve LDL control.  The 10-year ASCVD risk score (Arnett DK, et al., 2019) is: 4.4%   Values used to calculate the score:     Age: 56 years     Sex: Male     Is Non-Hispanic African American: No     Diabetic: No     Tobacco smoker: No     Systolic Blood Pressure: 573 mmHg     Is BP treated: No     HDL Cholesterol: 47.2 mg/dL     Total Cholesterol: 200 mg/dL

## 2021-10-03 NOTE — Assessment & Plan Note (Signed)
Preventative protocols reviewed and updated unless pt declined. Discussed healthy diet and lifestyle.  

## 2021-10-03 NOTE — Assessment & Plan Note (Signed)
Encouraged ongoing healthy diet and lifestyle choices to affect sustainable weight loss.  Overall tolerating low dose wegovy - will continue titration - he will start 0.5mg  weekly next week. Update via MyChart with effect/tolerability.  RTC 3 mo weight management visit.

## 2021-10-03 NOTE — Assessment & Plan Note (Addendum)
Stable period on Adderall XL 20mg  daily.  Refilled today.

## 2021-10-03 NOTE — Assessment & Plan Note (Signed)
Overall reassuring exam. However description and location of pain suspicious for pes anserine bursitis. Provided with exercises from [facility] pt advisor.

## 2021-10-03 NOTE — Patient Instructions (Addendum)
You are doing well today Increase wegovy to 0.5mg  weekly. Update [place] with effect.  You may try scopolamine patches sent to pharmacy.  Try knee exercises provided today.  Return as needed or in 3 months for weight management visit.   Health Maintenance, Male Adopting a healthy lifestyle and getting preventive care are important in promoting health and wellness. Ask your health care provider about: The right schedule for you to have regular tests and exams. Things you can do on your own to prevent diseases and keep yourself healthy. What should I know about diet, weight, and exercise? Eat a healthy diet  Eat a diet that includes plenty of vegetables, fruits, low-fat dairy products, and lean protein. Do not eat a lot of foods that are high in solid fats, added sugars, or sodium. Maintain a healthy weight Body mass index (BMI) is a measurement that can be used to identify possible weight problems. It estimates body fat based on height and weight. Your health care provider can help determine your BMI and help you achieve or maintain a healthy weight. Get regular exercise Get regular exercise. This is one of the most important things you can do for your health. Most adults should: Exercise for at least 150 minutes each week. The exercise should increase your heart rate and make you sweat (moderate-intensity exercise). Do strengthening exercises at least twice a week. This is in addition to the moderate-intensity exercise. Spend less time sitting. Even light physical activity can be beneficial. Watch cholesterol and blood lipids Have your blood tested for lipids and cholesterol at 54 years of age, then have this test every 5 years. You may need to have your cholesterol levels checked more often if: Your lipid or cholesterol levels are high. You are older than 54 years of age. You are at high risk for heart disease. What should I know about cancer screening? Many types of cancers can be detected  early and may often be prevented. Depending on your health history and family history, you may need to have cancer screening at various ages. This may include screening for: Colorectal cancer. Prostate cancer. Skin cancer. Lung cancer. What should I know about heart disease, diabetes, and high blood pressure? Blood pressure and heart disease High blood pressure causes heart disease and increases the risk of stroke. This is more likely to develop in people who have high blood pressure readings or are overweight. Talk with your health care provider about your target blood pressure readings. Have your blood pressure checked: Every 3-5 years if you are 75-70 years of age. Every year if you are 58 years old or older. If you are between the ages of 43 and 15 and are a current or former smoker, ask your health care provider if you should have a one-time screening for abdominal aortic aneurysm (AAA). Diabetes Have regular diabetes screenings. This checks your fasting blood sugar level. Have the screening done: Once every three years after age 16 if you are at a normal weight and have a low risk for diabetes. More often and at a younger age if you are overweight or have a high risk for diabetes. What should I know about preventing infection? Hepatitis B If you have a higher risk for hepatitis B, you should be screened for this virus. Talk with your health care provider to find out if you are at risk for hepatitis B infection. Hepatitis C Blood testing is recommended for: Everyone born from [birth year] through 1963. Anyone with known risk  factors for hepatitis C. Sexually transmitted infections (STIs) You should be screened each year for STIs, including gonorrhea and chlamydia, if: You are sexually active and are younger than 54 years of age. You are older than 54 years of age and your health care provider tells you that you are at risk for this type of infection. Your sexual activity has changed since  you were last screened, and you are at increased risk for chlamydia or gonorrhea. Ask your health care provider if you are at risk. Ask your health care provider about whether you are at high risk for HIV. Your health care provider may recommend a prescription medicine to help prevent HIV infection. If you choose to take medicine to prevent HIV, you should first get tested for HIV. You should then be tested every 3 months for as long as you are taking the medicine. Follow these instructions at home: Alcohol use Do not drink alcohol if your health care provider tells you not to drink. If you drink alcohol: Limit how much you have to 0-2 drinks a day. Know how much alcohol is in your drink. In the [place] one drink equals one 12 oz bottle of beer (355 mL), one 5 oz glass of wine (148 mL), or one 1 oz glass of hard liquor (44 mL). Lifestyle Do not use any products that contain nicotine or tobacco. These products include cigarettes, chewing tobacco, and vaping devices, such as e-cigarettes. If you need help quitting, ask your health care provider. Do not use street drugs. Do not share needles. Ask your health care provider for help if you need support or information about quitting drugs. General instructions Schedule regular health, dental, and eye exams. Stay current with your vaccines. Tell your health care provider if: You often feel depressed. You have ever been abused or do not feel safe at home. Summary Adopting a healthy lifestyle and getting preventive care are important in promoting health and wellness. Follow your health care provider's instructions about healthy diet, exercising, and getting tested or screened for diseases. Follow your health care provider's instructions on monitoring your cholesterol and blood pressure. This information is not intended to replace advice given to you by your health care provider. Make sure you discuss any questions you have with your health care  provider. Document Revised: 12/12/2020 Document Reviewed: 12/12/2020 Elsevier Patient Education  [place].

## 2021-10-24 ENCOUNTER — Other Ambulatory Visit: Payer: Self-pay | Admitting: Family Medicine

## 2021-10-25 NOTE — Telephone Encounter (Signed)
Refill request Wegovy ?Last refill 09/16/21  2 ML ?Last office visit 10/03/21 ?

## 2021-10-26 MED ORDER — WEGOVY 1 MG/0.5ML ~~LOC~~ SOAJ: 1.0000 mg | SUBCUTANEOUS | 0 refills | Status: DC

## 2021-10-26 NOTE — Telephone Encounter (Signed)
ERx 

## 2021-11-06 ENCOUNTER — Other Ambulatory Visit: Payer: Self-pay | Admitting: Family Medicine

## 2021-11-06 MED ORDER — AMPHETAMINE-DEXTROAMPHET ER 25 MG PO CP24: 25.0000 mg | ORAL_CAPSULE | ORAL | 0 refills | Status: DC

## 2021-11-06 NOTE — Telephone Encounter (Signed)
Is this okay to refill? 

## 2021-11-06 NOTE — Telephone Encounter (Signed)
ERx 

## 2021-11-17 ENCOUNTER — Telehealth: Payer: Self-pay | Admitting: *Deleted

## 2021-11-17 MED ORDER — AMPHETAMINE-DEXTROAMPHET ER 25 MG PO CP24: 25.0000 mg | ORAL_CAPSULE | ORAL | 0 refills | Status: DC

## 2021-11-17 NOTE — Telephone Encounter (Signed)
Spoke with pt notifying him rx is ready to pick up.  Pt expresses his thanks.  ? ?[Placed rx at front office.] ?

## 2021-11-17 NOTE — Telephone Encounter (Signed)
Pt tried to pick up his adderall Rx and his pharmacy is out, he has called around and only pharmacy that had it was [facility] but they said they are not sure how long they will have it in stock. Pt is asking if PCP will cancel the Rx that was sent in on 11/06/21 to [facility] and pt would like a paper Rx so he can take around and find a pharmacy that has Rx.  ? ?Please call pt once Rx is ready for pick up ?

## 2021-11-17 NOTE — Telephone Encounter (Signed)
Rx printed and in Lisa's box.  ?

## 2021-11-22 ENCOUNTER — Other Ambulatory Visit: Payer: Self-pay | Admitting: Orthopedic Surgery

## 2021-11-22 DIAGNOSIS — G8929 Other chronic pain: Secondary | ICD-10-CM

## 2021-11-28 ENCOUNTER — Other Ambulatory Visit: Payer: Self-pay | Admitting: Family Medicine

## 2021-11-28 NOTE — Telephone Encounter (Signed)
Refill request Wegovy ?Last office visit 10/03/21  ?Last refill 10/26/21 2 ml ?Upcoming appointment 12/26/21 ?

## 2021-11-30 MED ORDER — WEGOVY 1.7 MG/0.75ML ~~LOC~~ SOAJ: 1.7000 mg | SUBCUTANEOUS | 0 refills | Status: DC

## 2021-11-30 NOTE — Telephone Encounter (Signed)
ERx 1.7mg dose 

## 2021-12-04 HISTORY — PX: MENISCUS REPAIR: SHX5179

## 2021-12-06 ENCOUNTER — Ambulatory Visit
Admission: RE | Admit: 2021-12-06 | Discharge: 2021-12-06 | Disposition: A | Discharge status: Home / Self Care | Payer: BC Managed Care – PPO | Source: Ambulatory Visit | Attending: Orthopedic Surgery | Admitting: Orthopedic Surgery

## 2021-12-06 DIAGNOSIS — G8929 Other chronic pain: Secondary | ICD-10-CM

## 2021-12-19 ENCOUNTER — Other Ambulatory Visit: Payer: Self-pay

## 2021-12-19 NOTE — Telephone Encounter (Signed)
Last refill: 11/17/21 #30 ?Last OV: 10/03/21 ?Next OV: 12/29/21 ? ?Pt would like this as a printed Rx so he can find a pharmacy that has it in stock.  ?Please call pt when it is ready for pickup.  ?

## 2021-12-20 MED ORDER — AMPHETAMINE-DEXTROAMPHET ER 25 MG PO CP24: 25.0000 mg | ORAL_CAPSULE | ORAL | 0 refills | Status: DC

## 2021-12-20 NOTE — Telephone Encounter (Signed)
Printed and in Lisa's box.  

## 2021-12-21 NOTE — Telephone Encounter (Addendum)
Spoke with pt notifying him printed rx is ready to pick up. Expresses his thanks and states his wife, Nathan Camacho (on dpr), will pick it up due to pt having had knee surgery yesterday.   [Placed rx at front office.]

## 2021-12-26 ENCOUNTER — Encounter: Payer: Self-pay | Admitting: Family Medicine

## 2021-12-26 ENCOUNTER — Ambulatory Visit: Payer: BC Managed Care – PPO | Admitting: Family Medicine

## 2021-12-26 MED ORDER — WEGOVY 2.4 MG/0.75ML ~~LOC~~ SOAJ: 2.4000 mg | SUBCUTANEOUS | 3 refills | Status: DC

## 2021-12-26 NOTE — Assessment & Plan Note (Addendum)
Encouraged ongoing efforts towards healthy diet and lifestyle. Activity currently limited due to recovering from R meniscal knee surgery last week. 24 hour dietary recall reviewed.  Will increase wegovy to full dose 2.'4mg'$  weekly. RTC 3 mo weight management f/u visit. Pt agrees with plan.

## 2021-12-26 NOTE — Patient Instructions (Signed)
Good to see you today Glad you're recovering well after knee surgery! Let's increase wegovy to 2.'4mg'$  weekly - new dose sent to pharmacy.  Schedule follow up visit in 3 months.

## 2021-12-26 NOTE — Progress Notes (Signed)
Patient ID: Nathan Camacho, male    DOB: [birth year], 56 y.o.   MRN: [id number]  This visit was conducted in person.  BP 130/78   Pulse 76   Temp 98.2 F (36.8 C) (Temporal)   Ht '5\' 11"'$  (1.803 m)   Wt 262 lb 6 oz (119 kg)   SpO2 97%   BMI 36.59 kg/m    CC: weight management visit Subjective:   HPI: Nathan Camacho is a 57 y.o. male presenting on 12/26/2021 for Weight Management (Here for f/u. Wants to discuss increasing up to next dose of Wegovy. )   Starting weight: 299 lbs Last weight: 260 lbs Today's weight 262 lbs  Wegovy started 09/2021. Currently on wegovy 1.'7mg'$  weekly (started 4/27), tolerating well without constipation, epigastric pain. Notes mild nausea 12 hours after each dose.  Previously on Saxenda.   Over the past few months has gone on 2 cruises.   Just completed R knee meniscal repair surgery last week (Dr [name]). Has post-op visit later today. Recovering well. Less active due to this. He is using ibuprofen '600mg'$  after this.   24 hour recall: 7am breakfast - 2 pieces of toast with jelly as well as premier protein shake (30gm proteins) as well as hard boiled egg  1pm lunch - protein shake (30gm proteins) 3pm snack - granola bar  7pm dinner - protein shake (30gm protein) and hamburger with cheddar cheese on bun, 3 slices of dill pickles  1.5 bottles of water yesterday (25 oz total)   Stopped all coffee.   Activity regimen: Limited due to R knee - see above      Relevant past medical, surgical, family and social history reviewed and updated as indicated. Interim medical history since our last visit reviewed. Allergies and medications reviewed and updated. Outpatient Medications Prior to Visit  Medication Sig Dispense Refill   amphetamine-dextroamphetamine (ADDERALL XR) 25 MG 24 hr capsule Take 1 capsule by mouth every morning. 30 capsule 0   fexofenadine (ALLEGRA) 180 MG tablet Take 1 tablet (180 mg total) by mouth daily. (Patient taking differently:  Take 180 mg by mouth daily. Seasonally as needed) 30 tablet 11   fluticasone (FLONASE) 50 MCG/ACT nasal spray Place 2 sprays into both nostrils daily. (Patient taking differently: Place 2 sprays into both nostrils daily. Seasonally as needed) 16 g 11   Glucosamine-Chondroit-Vit C-Mn (GLUCOSAMINE 1500 COMPLEX) CAPS daily.     ibuprofen (ADVIL) 600 MG tablet Take 600 mg by mouth every 8 (eight) hours as needed.     Multiple Vitamin (MULTIVITAMIN) tablet Take 1 tablet by mouth daily.     Omega-3 Fatty Acids (FISH OIL) 1000 MG CAPS Take 1 capsule (1,000 mg total) by mouth daily.     scopolamine (TRANSDERM-SCOP) 1 MG/3DAYS Place 1 patch (1.5 mg total) onto the skin every 3 (three) days. 10 patch 1   Semaglutide-Weight Management (WEGOVY) 1.7 MG/0.75ML SOAJ Inject 1.7 mg into the skin once a week. 3 mL 0   No facility-administered medications prior to visit.     Per HPI unless specifically indicated in ROS section below Review of Systems  Objective:  BP 130/78   Pulse 76   Temp 98.2 F (36.8 C) (Temporal)   Ht '5\' 11"'$  (1.803 m)   Wt 262 lb 6 oz (119 kg)   SpO2 97%   BMI 36.59 kg/m   Wt Readings from Last 3 Encounters:  12/26/21 262 lb 6 oz (119 kg)  10/03/21 260 lb 3 oz (  118 kg)  06/19/21 262 lb (118.8 kg)      Physical Exam Vitals and nursing note reviewed.  Constitutional:      Appearance: Normal appearance. He is not ill-appearing.  Eyes:     Extraocular Movements: Extraocular movements intact.     Pupils: Pupils are equal, round, and reactive to light.  Cardiovascular:     Rate and Rhythm: Normal rate and regular rhythm.     Pulses: Normal pulses.     Heart sounds: Normal heart sounds. No murmur heard. Pulmonary:     Effort: Pulmonary effort is normal. No respiratory distress.     Breath sounds: Normal breath sounds. No wheezing, rhonchi or rales.  Musculoskeletal:     Right lower leg: No edema.     Left lower leg: No edema.  Skin:    General: Skin is warm and dry.      Findings: No rash.  Neurological:     Mental Status: He is alert.  Psychiatric:        Mood and Affect: Mood normal.        Behavior: Behavior normal.       Assessment & Plan:   Problem List Items Addressed This Visit     Severe obesity (BMI 35.0-39.9) with comorbidity ([place]) - Primary    Encouraged ongoing efforts towards healthy diet and lifestyle. Activity currently limited due to recovering from R meniscal knee surgery last week. 24 hour dietary recall reviewed.  Will increase wegovy to full dose 2.'4mg'$  weekly. RTC 3 mo weight management f/u visit. Pt agrees with plan.        Relevant Medications   Semaglutide-Weight Management (WEGOVY) 2.4 MG/0.75ML SOAJ     Meds ordered this encounter  Medications   Semaglutide-Weight Management (WEGOVY) 2.4 MG/0.75ML SOAJ    Sig: Inject 2.4 mg into the skin once a week.    Dispense:  3 mL    Refill:  3   No orders of the defined types were placed in this encounter.    Patient Instructions  Good to see you today Glad you're recovering well after knee surgery! Let's increase wegovy to 2.'4mg'$  weekly - new dose sent to pharmacy.  Schedule follow up visit in 3 months.   Follow up plan: Return in about 3 months (around 03/28/2022) for follow up visit.  Ria Bush, MD

## 2021-12-29 ENCOUNTER — Ambulatory Visit: Payer: BC Managed Care – PPO | Admitting: Family Medicine

## 2022-01-19 ENCOUNTER — Other Ambulatory Visit: Payer: Self-pay | Admitting: Family Medicine

## 2022-01-19 MED ORDER — AMPHETAMINE-DEXTROAMPHET ER 25 MG PO CP24: 25.0000 mg | ORAL_CAPSULE | ORAL | 0 refills | Status: DC

## 2022-01-19 NOTE — Telephone Encounter (Signed)
ERx 

## 2022-01-19 NOTE — Telephone Encounter (Signed)
Refill request for amphetamine-dextroamphetamine (ADDERALL XR) 25 MG 24 hr capsule  LOV - 12/26/21 Next OV - 04/03/22 Last refill - 12/20/21 #30/0

## 2022-01-19 NOTE — Addendum Note (Signed)
Addended by: Ria Bush on: 01/19/2022 01:53 PM   Modules accepted: Orders

## 2022-02-14 ENCOUNTER — Other Ambulatory Visit: Payer: Self-pay | Admitting: Family Medicine

## 2022-02-14 ENCOUNTER — Encounter: Payer: Self-pay | Admitting: Family Medicine

## 2022-02-15 ENCOUNTER — Telehealth: Payer: Self-pay

## 2022-02-15 NOTE — Telephone Encounter (Signed)
Prior auth started for [facility] 2.'4MG'$ /0.75ML auto-injectors. Delilah Shan (KeyBenard Halsted) Rx #: [id number] Waiting for determination.

## 2022-02-15 NOTE — Telephone Encounter (Signed)
Name of Medication: Adderall XR Name of Pharmacy: [facility] Last Fill or Written Date and Quantity: 01/19/22, #30 Last Office Visit and Type: 12/26/21, wt mgmt f/u Next Office Visit and Type: 04/03/22, 3 mo wt mgmt f/u Last Controlled Substance Agreement Date: 08/17/19 Last UDS: 08/17/19

## 2022-02-16 MED ORDER — AMPHETAMINE-DEXTROAMPHET ER 25 MG PO CP24: 25.0000 mg | ORAL_CAPSULE | ORAL | 0 refills | Status: DC

## 2022-02-16 NOTE — Telephone Encounter (Signed)
Prior auth for [facility] 2.4MG/0.75ML auto-injectors has been denied.  [facility]  received a request from your provider for coverage of Wegovy (semaglutide injection). The request was denied because: You do not meet the requirements of your plan. The information we received indicated that this request for coverage is to continue therapy on this medication. Current plan approved criteria covers this drug when you have been taking it for at least three months at a stable maintenance dose and met any of these conditions: - You are at least 58 years of age or older - You lost at least 5 percent of your body weight - You have continued to keep your initial 5 percent weight loss off   Denial letter sent to scanning.

## 2022-02-16 NOTE — Telephone Encounter (Signed)
ERx 

## 2022-02-16 NOTE — Telephone Encounter (Addendum)
Starting weight was 299 lbs per notes.  In that case he's lost ~12% body weight so far.  Will see if insurance covers wegovy with this info.

## 2022-02-16 NOTE — Telephone Encounter (Signed)
I have faxed in appeal with corrected information about current weight loss for prior auth for [facility] 2.'4MG'$ /0.75ML auto-injectors. Rony Sanford (Key: [id number]) I need to have patient sign a form so that we can complete the appeal on his behalf.  I am sending a note to him on mychart to see what is the best way to get the form to him to sign.  Once it is signed, I will fax it in and the appeal process can proceed.

## 2022-02-20 ENCOUNTER — Emergency Department (INDEPENDENT_AMBULATORY_CARE_PROVIDER_SITE_OTHER): Payer: BC Managed Care – PPO

## 2022-02-20 ENCOUNTER — Emergency Department
Admission: EM | Admit: 2022-02-20 | Discharge: 2022-02-20 | Disposition: A | Discharge status: Home / Self Care | Payer: BC Managed Care – PPO | Attending: Family Medicine | Admitting: Family Medicine

## 2022-02-20 ENCOUNTER — Encounter (HOSPITAL_BASED_OUTPATIENT_CLINIC_OR_DEPARTMENT_OTHER): Payer: Self-pay

## 2022-02-20 ENCOUNTER — Other Ambulatory Visit: Payer: Self-pay

## 2022-02-20 DIAGNOSIS — R1032 Left lower quadrant pain: Secondary | ICD-10-CM | POA: Diagnosis not present

## 2022-02-20 DIAGNOSIS — R109 Unspecified abdominal pain: Secondary | ICD-10-CM | POA: Diagnosis present

## 2022-02-20 DIAGNOSIS — R110 Nausea: Secondary | ICD-10-CM | POA: Diagnosis not present

## 2022-02-20 DIAGNOSIS — R3129 Other microscopic hematuria: Secondary | ICD-10-CM

## 2022-02-20 LAB — URINALYSIS, ROUTINE W REFLEX MICROSCOPIC
Bilirubin Urine: NEGATIVE
Glucose, UA: NEGATIVE mg/dL
Ketones, ur: >80 mg/dL — AB
Leukocytes,Ua: NEGATIVE
Nitrite: NEGATIVE
Protein, ur: 30 mg/dL — AB
RBC / HPF: >50 RBC/hpf — ABNORMAL HIGH (ref 0–5)
Specific Gravity, Urine: 1.024 (ref 1.005–1.030)
pH: 8.5 — ABNORMAL HIGH (ref 5.0–8.0)

## 2022-02-20 LAB — BASIC METABOLIC PANEL
Anion gap: 17 — ABNORMAL HIGH (ref 5–15)
BUN: 21 mg/dL — ABNORMAL HIGH (ref 6–20)
CO2: 18 mmol/L — ABNORMAL LOW (ref 22–32)
Calcium: 10.5 mg/dL — ABNORMAL HIGH (ref 8.9–10.3)
Chloride: 103 mmol/L (ref 98–111)
Creatinine, Ser: 1.28 mg/dL — ABNORMAL HIGH (ref 0.61–1.24)
GFR, Estimated: >60 mL/min (ref >60)
Glucose, Bld: 135 mg/dL — ABNORMAL HIGH (ref 70–99)
Potassium: 4.2 mmol/L (ref 3.5–5.1)
Sodium: 138 mmol/L (ref 135–145)

## 2022-02-20 LAB — CBC
HCT: 48.6 % (ref 39.0–52.0)
Hemoglobin: 17.4 g/dL — ABNORMAL HIGH (ref 13.0–17.0)
MCH: 29.7 pg (ref 26.0–34.0)
MCHC: 35.8 g/dL (ref 30.0–36.0)
MCV: 82.9 fL (ref 80.0–100.0)
Platelets: 226 10*3/uL (ref 150–400)
RBC: 5.86 MIL/uL — ABNORMAL HIGH (ref 4.22–5.81)
RDW: 12.7 % (ref 11.5–15.5)
WBC: 14.9 10*3/uL — ABNORMAL HIGH (ref 4.0–10.5)
nRBC: 0 % (ref 0.0–0.2)

## 2022-02-20 LAB — POCT URINALYSIS DIP (MANUAL ENTRY)
Glucose, UA: NEGATIVE mg/dL
Leukocytes, UA: NEGATIVE
Nitrite, UA: NEGATIVE
Protein Ur, POC: 30 mg/dL — AB
Spec Grav, UA: 1.025 (ref 1.010–1.025)
Urobilinogen, UA: 0.2 E.U./dL
pH, UA: 5.5 (ref 5.0–8.0)

## 2022-02-20 NOTE — ED Triage Notes (Signed)
Patient here POV from Home.  Endorses Left Sided Flank Pain that began at 1200. Worsened since Pain began.  Moderate Nausea. No Emesis. NO Known Fevers. No Diarrhea.   Patient went to [facility] and sent Patient for Evaluation. Hematuria noted in UA.   Uncomfortable during Triage. A&Ox4. GCS 15. Ambulatory.

## 2022-02-20 NOTE — Discharge Instructions (Signed)
Go to ER

## 2022-02-20 NOTE — ED Triage Notes (Signed)
Pt states that he is having some upper left abdominal pain. X1 day  Pt states that he thinks he may have a kidney stone.  Pt states that he doesn't have in blood in his urine.  Pt states that he has also taken Miralax but hasn't had any bowel movement yet.

## 2022-02-20 NOTE — ED Provider Notes (Signed)
[facility]    CSN: [id number] Arrival date & time: 02/20/22  1920      History   Chief Complaint Chief Complaint  Patient presents with   Abdominal Pain    Abdominal pain (left side). Upper left sided abdominal pain. X1 day    HPI CLEVEN JANSMA is a 56 y.o. male.   HPI  Patient is here for abdominal pain.  It is in the left upper and mid quadrant.  He states that is happened several times today where he will have a wave of severe pain, break out in a sweat, feel little bit queasy, and double over in pain in his left side.  He states that the pain is in the mid upper left abdomen going a little bit down towards his groin.  He has had nausea with waves of pain but not otherwise.  No vomiting.  No fever or chills.  No hematuria.  No problems with bowels.  He thought earlier today that he might be constipated but that he did have a bowel movement.  He took some MiraLAX.  In spite of this he continues to have waves of severe pain He has never had any problems with his bowels or digestion, colitis He does not have a history of kidney stones although his wife suspects that is what is going on He is in fairly good health.  He is working on losing weight and has lost 50 pounds in the last several months.  Past Medical History:  Diagnosis Date   Adult ADHD 08/24/2014   S/p eval by our psychologist Dr Lurline Hare - consistent with ADHD but mild case currently.     Allergy    COVID-19 04/17/2021    Patient Active Problem List   Diagnosis Date Noted   Right medial knee pain 10/03/2021   Lipoma of skin of abdomen 12/24/2018   Left knee pain 07/01/2018   Plantar fasciitis 11/19/2017   Dyslipidemia 07/24/2017   Health maintenance examination 11/11/2014   Adult ADHD 08/24/2014   Persistent dry cough 06/29/2014   Severe obesity (BMI 35.0-39.9) with comorbidity ([place]) 03/27/2013   Allergic rhinitis 12/04/2006    Past Surgical History:  Procedure Laterality Date   COLONOSCOPY   05/2019   TAx2, diverticulosis, rpt 5 yrs ([facility]am)   MENISCUS REPAIR Left 01/2019   [facility]   MENISCUS REPAIR Right 12/2021   [facility]   WISDOM TOOTH EXTRACTION     not sedated        Home Medications    Prior to Admission medications   Medication Sig Start Date End Date Taking? Authorizing Provider  amphetamine-dextroamphetamine (ADDERALL XR) 25 MG 24 hr capsule Take 1 capsule by mouth every morning. 02/16/22  Yes Ria Bush, MD  fexofenadine (ALLEGRA) 180 MG tablet Take 1 tablet (180 mg total) by mouth daily. Patient taking differently: Take 180 mg by mouth daily. Seasonally as needed 07/24/17  Yes Ria Bush, MD  fluticasone [facility]) 50 MCG/ACT nasal spray Place 2 sprays into both nostrils daily. Patient taking differently: Place 2 sprays into both nostrils daily. Seasonally as needed 07/24/17  Yes Ria Bush, MD  Glucosamine-Chondroit-Vit C-Mn (GLUCOSAMINE 1500 COMPLEX) CAPS daily.   Yes [provider]  ibuprofen (ADVIL) 600 MG tablet Take 600 mg by mouth every 8 (eight) hours as needed. 12/20/21  Yes [provider]  Multiple Vitamin (MULTIVITAMIN) tablet Take 1 tablet by mouth daily.   Yes [provider]  Omega-3 Fatty Acids (FISH OIL) 1000 MG CAPS  Take 1 capsule (1,000 mg total) by mouth daily. 09/20/20  Yes Ria Bush, MD  scopolamine (TRANSDERM-SCOP) 1 MG/3DAYS Place 1 patch (1.5 mg total) onto the skin every 3 (three) days. 10/03/21  Yes Ria Bush, MD  Semaglutide-Weight Management [facility]) 2.4 MG/0.75ML SOAJ Inject 2.4 mg into the skin once a week. 12/26/21  Yes Ria Bush, MD    Family History Family History  Problem Relation Age of Onset   Crohn's disease Mother    Atrial fibrillation Mother    Hypertension Mother    COPD Father        smoker   Cancer Father 15       anal   Rectal cancer Father 63   Cancer Paternal Grandfather        stomach   Stomach cancer Paternal Grandfather    Colon polyps  Neg Hx    Colon cancer Neg Hx    Esophageal cancer Neg Hx     Social History Social History   Tobacco Use   Smoking status: Former    Types: Cigarettes    Quit date: 12/17/1993    Years since quitting: 28.1   Smokeless tobacco: Never  Vaping Use   Vaping Use: Never used  Substance Use Topics   Alcohol use: Yes    Comment: social    Drug use: No     Allergies   Bee venom   Review of Systems Review of Systems See HPI  Physical Exam Triage Vital Signs ED Triage Vitals  Enc Vitals Group     BP 02/20/22 1935 124/80     Pulse Rate 02/20/22 1935 81     Resp 02/20/22 1935 18     Temp 02/20/22 1935 99.5 F (37.5 C)     Temp Source 02/20/22 1935 Oral     SpO2 02/20/22 1935 97 %     Weight 02/20/22 1933 245 lb (111.1 kg)     Height 02/20/22 1933 '5\' 11"'$  (1.803 m)     Head Circumference --      Peak Flow --      Pain Score 02/20/22 1933 2     Pain Loc --      Pain Edu? --      Excl. in [place]? --    No data found.  Updated Vital Signs BP 124/80 (BP Location: Left Arm)   Pulse 81   Temp 99.5 F (37.5 C) (Oral)   Resp 18   Ht '5\' 11"'$  (1.803 m)   Wt 111.1 kg   SpO2 97%   BMI 34.17 kg/m        Physical Exam Constitutional:      General: He is in acute distress.     Appearance: He is well-developed. He is ill-appearing.     Comments: Pleasant.  Overweight.  Diaphoretic.  Obviously uncomfortable  HENT:     Head: Normocephalic and atraumatic.  Eyes:     Conjunctiva/sclera: Conjunctivae normal.     Pupils: Pupils are equal, round, and reactive to light.  Cardiovascular:     Rate and Rhythm: Normal rate and regular rhythm.     Heart sounds: Normal heart sounds.  Pulmonary:     Effort: Pulmonary effort is normal. No respiratory distress.     Breath sounds: Normal breath sounds.  Abdominal:     General: Abdomen is protuberant. Bowel sounds are normal. There is no distension.     Palpations: Abdomen is soft.     Tenderness: There is abdominal tenderness in  the  left upper quadrant and left lower quadrant. There is left CVA tenderness.  Musculoskeletal:        General: Normal range of motion.     Cervical back: Normal range of motion.  Skin:    General: Skin is warm and dry.  Neurological:     General: No focal deficit present.     Mental Status: He is alert.      UC Treatments / Results  Labs (all labs ordered are listed, but only abnormal results are displayed) Labs Reviewed  POCT URINALYSIS DIP (MANUAL ENTRY) - Abnormal; Notable for the following components:      Result Value   Bilirubin, UA small (*)    Ketones, POC UA moderate (40) (*)    Blood, UA large (*)    Protein Ur, POC =30 (*)    All other components within normal limits  URINE CULTURE    EKG   Radiology DG Abdomen 1 View  Result Date: 02/20/2022 CLINICAL DATA:  Left lower quadrant pain, nausea EXAM: ABDOMEN - 1 VIEW COMPARISON:  None Available. FINDINGS: 2 supine frontal views of the abdomen and pelvis are obtained. No bowel obstruction or ileus. Moderate retained stool within the proximal colon. No masses or abnormal calcifications. Visualized portions of the lung bases are clear. IMPRESSION: 1. Moderate retained stool within the proximal colon compatible with constipation. 2. No bowel obstruction or ileus. Electronically Signed   By: Randa Ngo M.D.   On: 02/20/2022 19:56    Procedures Procedures (including critical care time)  Medications Ordered in UC Medications - No data to display  Initial Impression / Assessment and Plan / UC Course  I have reviewed the triage vital signs and the nursing notes.  Pertinent labs & imaging results that were available during my care of the patient were reviewed by me and considered in my medical decision making (see chart for details).     Patient is obviously having accelerated pain with left flank pain and hematuria.  I do suspect he has a kidney stone.  He has never been diagnosed with kidney stone before.  He needs  additional imaging and pain management tonight.  I have recommended that he go to the emergency room for care.  He does need a higher level of care Final Clinical Impressions(s) / UC Diagnoses   Final diagnoses:  Flank pain  Other microscopic hematuria     Discharge Instructions      Go to ER   ED Prescriptions   None    PDMP not reviewed this encounter.   Raylene Everts, MD 02/20/22 2019

## 2022-02-21 ENCOUNTER — Emergency Department (HOSPITAL_BASED_OUTPATIENT_CLINIC_OR_DEPARTMENT_OTHER)
Admission: EM | Admit: 2022-02-21 | Discharge: 2022-02-21 | Disposition: A | Discharge status: Home / Self Care | Payer: BC Managed Care – PPO | Attending: Emergency Medicine | Admitting: Emergency Medicine

## 2022-02-21 ENCOUNTER — Emergency Department (HOSPITAL_BASED_OUTPATIENT_CLINIC_OR_DEPARTMENT_OTHER): Payer: BC Managed Care – PPO

## 2022-02-21 ENCOUNTER — Telehealth: Payer: Self-pay | Admitting: Emergency Medicine

## 2022-02-21 DIAGNOSIS — N200 Calculus of kidney: Secondary | ICD-10-CM

## 2022-02-21 MED ORDER — OXYCODONE-ACETAMINOPHEN 5-325 MG PO TABS
1.0000 | ORAL_TABLET | Freq: Four times a day (QID) | ORAL | 0 refills | Status: DC | PRN
Start: 2022-02-21 — End: 2022-02-21

## 2022-02-21 MED ORDER — ONDANSETRON HCL 4 MG/2ML IJ SOLN
4.0000 mg | Freq: Once | INTRAMUSCULAR | Status: AC
  Administered 2022-02-21: 4 mg via INTRAVENOUS
  Filled 2022-02-21: qty 2

## 2022-02-21 MED ORDER — OXYCODONE-ACETAMINOPHEN 5-325 MG PO TABS
1.0000 | ORAL_TABLET | Freq: Four times a day (QID) | ORAL | 0 refills | Status: DC | PRN
Start: 2022-02-21 — End: 2022-04-03

## 2022-02-21 MED ORDER — MORPHINE SULFATE (PF) 4 MG/ML IV SOLN
4.0000 mg | Freq: Once | INTRAVENOUS | Status: AC
  Administered 2022-02-21: 4 mg via INTRAVENOUS
  Filled 2022-02-21: qty 1

## 2022-02-21 MED ORDER — TAMSULOSIN HCL 0.4 MG PO CAPS
0.4000 mg | ORAL_CAPSULE | Freq: Every day | ORAL | 0 refills | Status: AC
Start: 2022-02-21 — End: 2022-03-23

## 2022-02-21 MED ORDER — KETOROLAC TROMETHAMINE 30 MG/ML IJ SOLN
30.0000 mg | Freq: Once | INTRAMUSCULAR | Status: AC
  Administered 2022-02-21: 30 mg via INTRAVENOUS
  Filled 2022-02-21: qty 1

## 2022-02-21 NOTE — ED Notes (Signed)
Pt verbalizes understanding of discharge instructions. Opportunity for questioning and answers were provided. Pt discharged from ED to home with significant other.   ? ?

## 2022-02-21 NOTE — Discharge Instructions (Addendum)
Begin taking percocet as prescribed as needed for pain.  Follow up with [facility] if not improving in the next 3-4 days.

## 2022-02-21 NOTE — Telephone Encounter (Signed)
Call back to Nathan Camacho to see how he was doing after discharge from ED. Nathan Camacho stated he was feeling better with the pain medication prescribed. RN asked if pt had flomax at home or if it was prescribed at ED  discharge. Pt stated he did not have flomax, allergies reviewed and confirmed by RN. Chart reviewed w/ provider (M.Ragan, [place]) here today - flomax sent in by provider. [name] RN for the follow up call and the care received at [facility] yesterday.

## 2022-02-21 NOTE — ED Provider Notes (Signed)
[place] EMERGENCY DEPT Provider Note   CSN: [id number] Arrival date & time: 02/20/22  2030     History  Chief Complaint  Patient presents with   Flank Pain    Nathan Camacho is a 56 y.o. male.  Patient is a 56 year old male with no significant past medical history.  Patient presenting today with complaints of left flank pain.  This started acutely this afternoon.  It was associated with diaphoresis.  Patient initially went to urgent care and was told he had blood in his urine, then referred to the ER.  He denies any bowel or bladder complaints.  He denies any fevers or chills.  There are no aggravating or alleviating factors.  The history is provided by the patient.       Home Medications Prior to Admission medications   Medication Sig Start Date End Date Taking? Authorizing Provider  amphetamine-dextroamphetamine (ADDERALL XR) 25 MG 24 hr capsule Take 1 capsule by mouth every morning. 02/16/22   Ria Bush, MD  fexofenadine (ALLEGRA) 180 MG tablet Take 1 tablet (180 mg total) by mouth daily. Patient taking differently: Take 180 mg by mouth daily. Seasonally as needed 07/24/17   Ria Bush, MD  fluticasone [facility]) 50 MCG/ACT nasal spray Place 2 sprays into both nostrils daily. Patient taking differently: Place 2 sprays into both nostrils daily. Seasonally as needed 07/24/17   Ria Bush, MD  Glucosamine-Chondroit-Vit C-Mn (GLUCOSAMINE 1500 COMPLEX) CAPS daily.    [provider]  ibuprofen (ADVIL) 600 MG tablet Take 600 mg by mouth every 8 (eight) hours as needed. 12/20/21   [provider]  Multiple Vitamin (MULTIVITAMIN) tablet Take 1 tablet by mouth daily.    [provider]  Omega-3 Fatty Acids (FISH OIL) 1000 MG CAPS Take 1 capsule (1,000 mg total) by mouth daily. 09/20/20   Ria Bush, MD  scopolamine (TRANSDERM-SCOP) 1 MG/3DAYS Place 1 patch (1.5 mg total) onto the skin every 3 (three) days. 10/03/21    Ria Bush, MD  Semaglutide-Weight Management [facility]) 2.4 MG/0.75ML SOAJ Inject 2.4 mg into the skin once a week. 12/26/21   Ria Bush, MD      Allergies    Bee venom    Review of Systems   Review of Systems  All other systems reviewed and are negative.   Physical Exam Updated Vital Signs BP 109/78   Pulse 71   Temp 97.6 F (36.4 C)   Resp 17   Ht '5\' 11"'$  (1.803 m)   Wt 111.1 kg   SpO2 94%   BMI 34.16 kg/m  Physical Exam Vitals and nursing note reviewed.  Constitutional:      General: He is not in acute distress.    Appearance: He is well-developed. He is not diaphoretic.  HENT:     Head: Normocephalic and atraumatic.  Cardiovascular:     Rate and Rhythm: Normal rate and regular rhythm.     Heart sounds: No murmur heard.    No friction rub.  Pulmonary:     Effort: Pulmonary effort is normal. No respiratory distress.     Breath sounds: Normal breath sounds. No wheezing or rales.  Abdominal:     General: Bowel sounds are normal. There is no distension.     Palpations: Abdomen is soft.     Tenderness: There is no abdominal tenderness. There is left CVA tenderness. There is no right CVA tenderness, guarding or rebound.  Musculoskeletal:        General: Normal range of  motion.     Cervical back: Normal range of motion and neck supple.  Skin:    General: Skin is warm and dry.  Neurological:     Mental Status: He is alert and oriented to person, place, and time.     Coordination: Coordination normal.     ED Results / Procedures / Treatments   Labs (all labs ordered are listed, but only abnormal results are displayed) Labs Reviewed  URINALYSIS, ROUTINE W REFLEX MICROSCOPIC - Abnormal; Notable for the following components:      Result Value   pH 8.5 (*)    Hgb urine dipstick MODERATE (*)    Ketones, ur >80 (*)    Protein, ur 30 (*)    RBC / HPF >50 (*)    All other components within normal limits  BASIC METABOLIC PANEL - Abnormal; Notable for the  following components:   CO2 18 (*)    Glucose, Bld 135 (*)    BUN 21 (*)    Creatinine, Ser 1.28 (*)    Calcium 10.5 (*)    Anion gap 17 (*)    All other components within normal limits  CBC - Abnormal; Notable for the following components:   WBC 14.9 (*)    RBC 5.86 (*)    Hemoglobin 17.4 (*)    All other components within normal limits    EKG None  Radiology CT Renal Stone Study  Result Date: 02/21/2022 CLINICAL DATA:  Left-sided flank pain, kidney stone suspected. Nausea, hematuria. EXAM: CT ABDOMEN AND PELVIS WITHOUT CONTRAST TECHNIQUE: Multidetector CT imaging of the abdomen and pelvis was performed following the standard protocol without IV contrast. RADIATION DOSE REDUCTION: This exam was performed according to the departmental dose-optimization program which includes automated exposure control, adjustment of the mA and/or kV according to patient size and/or use of iterative reconstruction technique. COMPARISON:  None Available. FINDINGS: Lower chest: The heart is normal in size and scattered coronary artery calcifications are noted. The lung bases are clear. Hepatobiliary: No focal liver abnormality is seen. No gallstones, gallbladder wall thickening, or biliary dilatation. Pancreas: Unremarkable. No pancreatic ductal dilatation or surrounding inflammatory changes. Spleen: Normal in size without focal abnormality. Adrenals/Urinary Tract: No adrenal nodule or mass. No renal calculus bilaterally. Perinephric fat stranding is noted on the left. There is mild to moderate obstructive uropathy on the left with a 3 mm calculus in the distal left ureter at the UVJ. There is a suspected parapelvic cysts on the left. The bladder is unremarkable. Stomach/Bowel: Stomach is within normal limits. Appendix is not seen. No evidence of bowel wall thickening, distention, or inflammatory changes. No free air or pneumatosis. Scattered diverticula are present along the colon without evidence of  diverticulitis. Vascular/Lymphatic: No significant vascular findings are present. No enlarged abdominal or pelvic lymph nodes. Reproductive: The prostate gland is mildly enlarged. Other: A fat containing inguinal hernia is noted on the left. No abdominopelvic ascites. Musculoskeletal: Degenerative changes are present in the thoracolumbar spine. No acute osseous abnormality. IMPRESSION: 1. Mild to moderate obstructive uropathy on the left with a 3 mm calculus in the distal left ureter at the UVJ. 2. No renal calculus bilaterally. Suspected parapelvic cysts in the left kidney. 3. Diverticulosis without diverticulitis. 4. Coronary artery calcifications. Electronically Signed   By: Brett Fairy M.D.   On: 02/21/2022 00:25   DG Abdomen 1 View  Result Date: 02/20/2022 CLINICAL DATA:  Left lower quadrant pain, nausea EXAM: ABDOMEN - 1 VIEW COMPARISON:  None Available.  FINDINGS: 2 supine frontal views of the abdomen and pelvis are obtained. No bowel obstruction or ileus. Moderate retained stool within the proximal colon. No masses or abnormal calcifications. Visualized portions of the lung bases are clear. IMPRESSION: 1. Moderate retained stool within the proximal colon compatible with constipation. 2. No bowel obstruction or ileus. Electronically Signed   By: Randa Ngo M.D.   On: 02/20/2022 19:56    Procedures Procedures    Medications Ordered in ED Medications  morphine (PF) 4 MG/ML injection 4 mg (4 mg Intravenous Given 02/21/22 0020)  ondansetron (ZOFRAN) injection 4 mg (4 mg Intravenous Given 02/21/22 0020)  ketorolac (TORADOL) 30 MG/ML injection 30 mg (30 mg Intravenous Given 02/21/22 0020)    ED Course/ Medical Decision Making/ A&P  Patient presenting today with complaints of left flank pain that started acutely this afternoon.  Urinalysis reveals microscopic hematuria and CT renal shows a 3 mm stone at the left UVJ with obstruction.  Patient given morphine and Toradol and is feeling  significantly improved.  Patient seems appropriate for discharge with pain medication and as needed follow-up with urology.  He is well-appearing otherwise.  He does have a white count, but I suspect this is related to pain/stress.  He is afebrile.  Final Clinical Impression(s) / ED Diagnoses Final diagnoses:  None    Rx / DC Orders ED Discharge Orders     None         Veryl Speak, MD 02/21/22 0145

## 2022-02-22 NOTE — Telephone Encounter (Signed)
I called [employer] appeals dept to check on status of appeal.  Per [facility] at [facility], [phone], they did receive both the electronic appeal through Cover My Meds and the signed authorization page that Mr Pitsenbarger had to sign to authorize [place] to send in the appeal.  She did say that it can take up 30-45 days to make a determination.  We will be notified by fax when a determination is made.

## 2022-02-27 NOTE — Telephone Encounter (Signed)
I called and spoke to [name] after I spoke to [place] at [phone].  I called to check on the status of the appeal to verify that things were being processed.  They stated they did receive my two page fax, which was the patient's written consent form.  I had previously electronically sent the appeal on Cover My Meds.  [employer] stated that they did not get the written consent form, so the appeal could not continue forward.  I asked how that was possible, when they said they did receive my two page fax, which was what I faxed.  I went ahead and filled out the paper copy of the appeal and refaxed all info together to fax # [phone].  [name] also provided me with another fax # that he was given to send the appeal to, so I also faxed the info to [phone]. I notified [name] that I have faxed all info to both fax numbers and that we now have to wait until we get an answer.

## 2022-03-01 NOTE — Telephone Encounter (Signed)
I received a fax from [place], at F# [phone] that states that after researching, they found they do not process appeal requests for Nathan Camacho's prescription benefit program.  I sent a copy of letter to scanning.  I sent a msg to Nathan Whipp on Deloris Ping to let him know the fax number he was given does not process these appeals.

## 2022-03-08 NOTE — Telephone Encounter (Signed)
We received a letter stating that our provider appeal form is invalid because they do not have a form from Mr Pfeifle stating that we have permission to file this appeal on his behalf.  I called and spoke to [facility], at [facility] customer service, who stated they did receive my faxed appeal with written consent form from patient.  However, this form is invalid until patient sends in completed form giving [place] permission to file on his behalf.  I spoke to Mr Baston over the phone and he just received this form today and will fill out and turn into [place].

## 2022-03-09 NOTE — Telephone Encounter (Signed)
I have sent a copy of Nathan Camacho's permission letter to start the appeal process on his behalf to scanning, along with his fax confirmation.

## 2022-03-19 ENCOUNTER — Other Ambulatory Visit: Payer: Self-pay | Admitting: Family Medicine

## 2022-03-19 ENCOUNTER — Encounter: Payer: Self-pay | Admitting: Family Medicine

## 2022-03-20 MED ORDER — AMPHETAMINE-DEXTROAMPHET ER 25 MG PO CP24: 25.0000 mg | ORAL_CAPSULE | ORAL | 0 refills | Status: DC

## 2022-03-20 NOTE — Telephone Encounter (Signed)
ERx 

## 2022-03-20 NOTE — Telephone Encounter (Signed)
Message from pt via pharmacy:  Everything is going well, no issues.   Name of Medication: Adderall XR Name of Pharmacy: [facility] Last Fill or Written Date and Quantity: 02/16/22, #30 Last Office Visit and Type: 12/26/21, wt mgmt f/u Next Office Visit and Type: 04/03/22, 3 mo wt mgmt f/u Last Controlled Substance Agreement Date: 08/17/19 Last UDS: 08/17/19

## 2022-04-03 ENCOUNTER — Encounter: Payer: Self-pay | Admitting: Family Medicine

## 2022-04-03 ENCOUNTER — Ambulatory Visit: Payer: BC Managed Care – PPO | Admitting: Family Medicine

## 2022-04-03 DIAGNOSIS — N200 Calculus of kidney: Secondary | ICD-10-CM | POA: Diagnosis not present

## 2022-04-03 DIAGNOSIS — Z23 Encounter for immunization: Secondary | ICD-10-CM | POA: Diagnosis not present

## 2022-04-03 DIAGNOSIS — I2510 Atherosclerotic heart disease of native coronary artery without angina pectoris: Secondary | ICD-10-CM

## 2022-04-03 MED ORDER — WEGOVY 1.7 MG/0.75ML ~~LOC~~ SOAJ: 1.7000 mg | SUBCUTANEOUS | 0 refills | Status: DC

## 2022-04-03 NOTE — Assessment & Plan Note (Signed)
Reviewed CT findings with patient. Kidney stone passed with resolution of symptoms.

## 2022-04-03 NOTE — Patient Instructions (Addendum)
Flu shot today  Let me know when you find out about approval for [facility].  I recommend taking 1.'7mg'$  dose every 2 weeks for 1 month then weekly for 2 weeks before starting 2.'4mg'$  dose.  Return in 3 months for follow up visit.

## 2022-04-03 NOTE — Assessment & Plan Note (Signed)
Congratulated on weight loss to date. Has done exceptionally well with full dose Wegovy main limitation being getting insurance to cover this, currently on medication lapse for the past 3 weeks leading to weight gain. Given how long he's been off, recommend restart at 1.'7mg'$  dose for 1 month then return to 2.'4mg'$  dose once insurance approves this.

## 2022-04-03 NOTE — Assessment & Plan Note (Addendum)
Discussed with patient. Denies chest pain/DOE symptoms. Will need baseline EKG. No h/o HTN. No fmhx CAD.

## 2022-04-03 NOTE — Progress Notes (Signed)
Patient ID: Nathan Camacho, male    DOB: Sep 06, 1965, 56 y.o.   MRN: [id number]  This visit was conducted in person.  BP 128/78   Pulse 75   Temp (!) 97.2 F (36.2 C) (Temporal)   Ht '5\' 11"'$  (1.803 m)   Wt 252 lb (114.3 kg)   SpO2 97%   BMI 35.15 kg/m    CC: weight management f/u visit  Subjective:   HPI: Nathan Camacho is a 56 y.o. male presenting on 04/03/2022 for Weight Management (Here for 3 mo f/u.)   ER visit last month for first ever kidney stone - this finally passed. Drinking plenty of water. Incidentally found coronary artery calcification. Mother with h/o CHF and Afib. No fmhx heart attacks.   Starting weight: 299 lbs Last weight: 262 lbs Today's weight 252 lbs Nadir weight 238 (early August).   Wegovy started 09/2021. Difficulty with PA has led to lapse in medication - last dose was August 2nd. Was on full dose of 2.'4mg'$  weekly, tolerated well with only mild nausea for 24 hours, mild constipation, no epigastric pain.  Previously on Saxenda.   S/p R meniscal repair 12/2021 Nathan Camacho). Just received knee steroid injection 03/09/2022. A week ago started becoming more sore   24 hour recall: Not done.  Continues trying to do exercise   Activity regimen: Limited due to R knee pain      Relevant past medical, surgical, family and social history reviewed and updated as indicated. Interim medical history since our last visit reviewed. Allergies and medications reviewed and updated. Outpatient Medications Prior to Visit  Medication Sig Dispense Refill   amphetamine-dextroamphetamine (ADDERALL XR) 25 MG 24 hr capsule Take 1 capsule by mouth every morning. 30 capsule 0   fexofenadine (ALLEGRA) 180 MG tablet Take 1 tablet (180 mg total) by mouth daily. (Patient taking differently: Take 180 mg by mouth daily. Seasonally as needed) 30 tablet 11   fluticasone (FLONASE) 50 MCG/ACT nasal spray Place 2 sprays into both nostrils daily. (Patient taking differently: Place 2 sprays into  both nostrils daily. Seasonally as needed) 16 g 11   Glucosamine-Chondroit-Vit C-Mn (GLUCOSAMINE 1500 COMPLEX) CAPS daily.     ibuprofen (ADVIL) 600 MG tablet Take 600 mg by mouth every 8 (eight) hours as needed.     Multiple Vitamin (MULTIVITAMIN) tablet Take 1 tablet by mouth daily.     Omega-3 Fatty Acids (FISH OIL) 1000 MG CAPS Take 1 capsule (1,000 mg total) by mouth daily.     scopolamine (TRANSDERM-SCOP) 1 MG/3DAYS Place 1 patch (1.5 mg total) onto the skin every 3 (three) days. 10 patch 1   Semaglutide-Weight Management (WEGOVY) 2.4 MG/0.75ML SOAJ Inject 2.4 mg into the skin once a week. 3 mL 3   oxyCODONE-acetaminophen (PERCOCET) 5-325 MG tablet Take 1-2 tablets by mouth every 6 (six) hours as needed. 20 tablet 0   No facility-administered medications prior to visit.     Per HPI unless specifically indicated in ROS section below Review of Systems  Objective:  BP 128/78   Pulse 75   Temp (!) 97.2 F (36.2 C) (Temporal)   Ht '5\' 11"'$  (1.803 m)   Wt 252 lb (114.3 kg)   SpO2 97%   BMI 35.15 kg/m   Wt Readings from Last 3 Encounters:  04/03/22 252 lb (114.3 kg)  02/20/22 244 lb 14.9 oz (111.1 kg)  02/20/22 245 lb (111.1 kg)      Physical Exam Vitals and nursing note reviewed.  Constitutional:  Appearance: Normal appearance. He is obese. He is not ill-appearing.  HENT:     Mouth/Throat:     Mouth: Mucous membranes are moist.     Pharynx: Oropharynx is clear. No oropharyngeal exudate or posterior oropharyngeal erythema.  Cardiovascular:     Rate and Rhythm: Normal rate and regular rhythm.     Pulses: Normal pulses.     Heart sounds: Normal heart sounds. No murmur heard. Pulmonary:     Effort: Pulmonary effort is normal. No respiratory distress.     Breath sounds: Normal breath sounds. No wheezing, rhonchi or rales.  Musculoskeletal:     Right lower leg: No edema.     Left lower leg: No edema.  Skin:    General: Skin is warm and dry.     Findings: No rash.   Neurological:     Mental Status: He is alert.  Psychiatric:        Mood and Affect: Mood normal.        Behavior: Behavior normal.        Assessment & Plan:   Problem List Items Addressed This Visit     Severe obesity (BMI 35.0-39.9) with comorbidity ([place]) - Primary    Congratulated on weight loss to date. Has done exceptionally well with full dose Wegovy main limitation being getting insurance to cover this, currently on medication lapse for the past 3 weeks leading to weight gain. Given how long he's been off, recommend restart at 1.'7mg'$  dose for 1 month then return to 2.'4mg'$  dose once insurance approves this.       Relevant Medications   Semaglutide-Weight Management (WEGOVY) 1.7 MG/0.75ML SOAJ   Nephrolithiasis    Reviewed CT findings with patient. Kidney stone passed with resolution of symptoms.       Coronary artery calcification seen on CAT scan    Discussed with patient. Denies chest pain/DOE symptoms. Will need baseline EKG. No h/o HTN. No fmhx CAD.       Other Visit Diagnoses     Need for influenza vaccination       Relevant Orders   Flu Vaccine QUAD 22moIM (Fluarix, Fluzone & Alfiuria Quad PF) (Completed)        Meds ordered this encounter  Medications   Semaglutide-Weight Management (WEGOVY) 1.7 MG/0.75ML SOAJ    Sig: Inject 1.7 mg into the skin once a week.    Dispense:  3 mL    Refill:  0    For first refill since he's been off for 3 wks   Orders Placed This Encounter  Procedures   Flu Vaccine QUAD 674moM (Fluarix, Fluzone & Alfiuria Quad PF)     Patient Instructions  Flu shot today  Let me know [facility] you [facility] recommend taking 1.'[facility]'$  dose every 2 weeks for 1 month then weekly for 2 weeks before starting 2.'4mg'$  dose.  Return in 3 months for follow up visit.   Follow up plan: Return in about 3 months (around 07/04/2022) for follow up visit.  JaRia BushMD

## 2022-04-06 NOTE — Telephone Encounter (Signed)
Left vmail for Nathan Camacho at [facility] at ph# [phone] to check status of appeal for [facility].

## 2022-04-10 NOTE — Telephone Encounter (Signed)
I called [employer], spoke to [place], to check on status of appeal ID [id number] for [facility].  Per MJ, this appeal request has been approved.  The original denial decision has been overturned.  Reference # [id number].  We will receive a faxed copy of the appeal approval letter.  Notified patient via mychart.

## 2022-04-13 NOTE — Telephone Encounter (Signed)
Sent approval letter to scanning.

## 2022-04-18 ENCOUNTER — Other Ambulatory Visit: Payer: Self-pay | Admitting: Family Medicine

## 2022-04-18 MED ORDER — AMPHETAMINE-DEXTROAMPHET ER 25 MG PO CP24: 25.0000 mg | ORAL_CAPSULE | ORAL | 0 refills | Status: DC

## 2022-04-18 NOTE — Telephone Encounter (Signed)
Refill request Adderall Last refill 03/20/22 #30 Last office visit 04/03/22 Last  UDS 08/17/19

## 2022-04-18 NOTE — Telephone Encounter (Signed)
ERx 

## 2022-05-16 ENCOUNTER — Other Ambulatory Visit: Payer: Self-pay | Admitting: Family Medicine

## 2022-05-16 MED ORDER — AMPHETAMINE-DEXTROAMPHET ER 25 MG PO CP24: 25.0000 mg | ORAL_CAPSULE | ORAL | 0 refills | Status: DC

## 2022-05-16 NOTE — Telephone Encounter (Signed)
Last refill 04/18/22 #30 Last OV 04/03/22 Next OV 07-06-22 [facility]

## 2022-05-16 NOTE — Telephone Encounter (Signed)
ERx 

## 2022-06-13 ENCOUNTER — Other Ambulatory Visit: Payer: Self-pay | Admitting: Family Medicine

## 2022-06-13 NOTE — Telephone Encounter (Signed)
Message from pt via pharmacy:   All is well, no issues.   Name of Medication: Adderall XR Name of Pharmacy: [facility] Last Fill or Written Date and Quantity: 05/16/22, #30 Last Office Visit and Type: 04/03/22, 3 mo f/u Next Office Visit and Type: 07/06/22, 3 mo wt mgmt f/u Last Controlled Substance Agreement Date: 08/17/19 Last UDS: 08/17/19

## 2022-06-14 MED ORDER — AMPHETAMINE-DEXTROAMPHET ER 25 MG PO CP24: 25.0000 mg | ORAL_CAPSULE | ORAL | 0 refills | Status: DC

## 2022-06-14 NOTE — Telephone Encounter (Signed)
ERx 

## 2022-07-06 ENCOUNTER — Encounter: Payer: Self-pay | Admitting: Family Medicine

## 2022-07-06 ENCOUNTER — Ambulatory Visit: Payer: BC Managed Care – PPO | Admitting: Family Medicine

## 2022-07-06 VITALS — BP 126/74 | HR 83 | Temp 97.2°F | Ht 71.0 in | Wt 247.8 lb

## 2022-07-06 DIAGNOSIS — F909 Attention-deficit hyperactivity disorder, unspecified type: Secondary | ICD-10-CM | POA: Diagnosis not present

## 2022-07-06 DIAGNOSIS — E669 Obesity, unspecified: Secondary | ICD-10-CM | POA: Diagnosis not present

## 2022-07-06 MED ORDER — AMPHETAMINE-DEXTROAMPHET ER 25 MG PO CP24: 25.0000 mg | ORAL_CAPSULE | ORAL | 0 refills | Status: DC

## 2022-07-06 MED ORDER — WEGOVY 2.4 MG/0.75ML ~~LOC~~ SOAJ: 2.4000 mg | SUBCUTANEOUS | 3 refills | Status: DC

## 2022-07-06 NOTE — Progress Notes (Signed)
Patient ID: Nathan Camacho, male    DOB: 07/17/1966, 56 y.o.   MRN: [id number]  This visit was conducted in person.  BP 126/74   Pulse 83   Temp (!) 97.2 F (36.2 C) (Temporal)   Ht '5\' 11"'$  (1.803 m)   Wt 247 lb 12.8 oz (112.4 kg)   SpO2 97%   BMI 34.56 kg/m    CC: 3 mo weight management visit Subjective:   HPI: Nathan Camacho is a 56 y.o. male presenting on 07/06/2022 for Weight Management (Here for 3 mo f/u.)   Starting weight: 299 lbs Last weight: 252 lbs Today's weight 247 lbs  Continues wegovy 2.'4mg'$  weekly, started 09/2021. Tolerating med well without side effects. Lapse in med due to PA delay summer 2023.  Previously on saxenda.   24 hour recall: 7am breakfast - premiere protein shake 30gm protein and 1 gm sugar, 2 pieces of toast, 1 with peanut butter and 1 with preserves  11am snack - granola bar  1:30pm lunch - premiere protein shake 30gm protein and 1 gm sugar, 6 inch veggie sub  7:30pm dinner - premiere protein shake 30gm protein and 1 gm sugar and granola bar  Also takes multivitamin in the mornings Restarted glucosamine supplement  Activity regimen: Limited by R knee pain - Wednesday last week got steroid injection by ortho.  H/o R knee arthroscopy ~12/2021 for partial meniscectomy.  He is now back on celebrex '200mg'$  BID.   ADHD - on adderall XR '25mg'$  daily. Longstanding medication. Tolerating well without headache, chest pain, insomnia, anorexia, tics or mood changes. Last UDS 08/2019.      Relevant past medical, surgical, family and social history reviewed and updated as indicated. Interim medical history since our last visit reviewed. Allergies and medications reviewed and updated. Outpatient Medications Prior to Visit  Medication Sig Dispense Refill   celecoxib (CELEBREX) 200 MG capsule Take 200 mg by mouth 2 (two) times daily.     fexofenadine (ALLEGRA) 180 MG tablet Take 1 tablet (180 mg total) by mouth daily. (Patient taking differently: Take 180 mg  by mouth daily. Seasonally as needed) 30 tablet 11   fluticasone (FLONASE) 50 MCG/ACT nasal spray Place 2 sprays into both nostrils daily. (Patient taking differently: Place 2 sprays into both nostrils daily. Seasonally as needed) 16 g 11   Glucosamine-Chondroit-Vit C-Mn (GLUCOSAMINE 1500 COMPLEX) CAPS daily.     ibuprofen (ADVIL) 600 MG tablet Take 600 mg by mouth every 8 (eight) hours as needed.     Multiple Vitamin (MULTIVITAMIN) tablet Take 1 tablet by mouth daily.     Omega-3 Fatty Acids (FISH OIL) 1000 MG CAPS Take 1 capsule (1,000 mg total) by mouth daily.     amphetamine-dextroamphetamine (ADDERALL XR) 25 MG 24 hr capsule Take 1 capsule by mouth every morning. 30 capsule 0   Semaglutide-Weight Management (WEGOVY) 2.4 MG/0.75ML SOAJ Inject 2.4 mg into the skin once a week. 3 mL 3   scopolamine (TRANSDERM-SCOP) 1 MG/3DAYS Place 1 patch (1.5 mg total) onto the skin every 3 (three) days. 10 patch 1   Semaglutide-Weight Management (WEGOVY) 1.7 MG/0.75ML SOAJ Inject 1.7 mg into the skin once a week. 3 mL 0   No facility-administered medications prior to visit.     Per HPI unless specifically indicated in ROS section below Review of Systems  Objective:  BP 126/74   Pulse 83   Temp (!) 97.2 F (36.2 C) (Temporal)   Ht '5\' 11"'$  (1.803 m)  Wt 247 lb 12.8 oz (112.4 kg)   SpO2 97%   BMI 34.56 kg/m   Wt Readings from Last 3 Encounters:  07/06/22 247 lb 12.8 oz (112.4 kg)  04/03/22 252 lb (114.3 kg)  02/20/22 244 lb 14.9 oz (111.1 kg)      Physical Exam Vitals and nursing note reviewed.  Constitutional:      Appearance: Normal appearance. He is obese. He is not ill-appearing.  HENT:     Mouth/Throat:     Mouth: Mucous membranes are moist.     Pharynx: Oropharynx is clear. No oropharyngeal exudate or posterior oropharyngeal erythema.  Eyes:     Extraocular Movements: Extraocular movements intact.     Conjunctiva/sclera: Conjunctivae normal.     Pupils: Pupils are equal, round, and  reactive to light.  Cardiovascular:     Rate and Rhythm: Normal rate and regular rhythm.     Pulses: Normal pulses.     Heart sounds: Normal heart sounds. No murmur heard. Pulmonary:     Effort: Pulmonary effort is normal. No respiratory distress.     Breath sounds: Normal breath sounds. No wheezing, rhonchi or rales.  Musculoskeletal:     Right lower leg: No edema.     Left lower leg: No edema.  Skin:    General: Skin is warm and dry.  Neurological:     Mental Status: He is alert.  Psychiatric:        Mood and Affect: Mood normal.        Behavior: Behavior normal.        Assessment & Plan:   Problem List Items Addressed This Visit       Unprioritized   Obesity, Class I, BMI 30.0-34.9 (see actual BMI) - Primary    Congratulated on continued weight loss. Tolerating wegovy 2.'4mg'$  weekly - continue this. Refilled x 3 months. 24 hour food recall reviewed. Discussed exercise routine, encouraged non-weight bearing exercise to conserve cartilage.       Adult ADHD    Stable period on adderall XR '25mg'$  daily - continue this. Refilled x 3 months.  Update UDS today.       Relevant Orders   DRUG MONITORING, PANEL 8 WITH CONFIRMATION, URINE     Meds ordered this encounter  Medications   Semaglutide-Weight Management (WEGOVY) 2.4 MG/0.75ML SOAJ    Sig: Inject 2.4 mg into the skin once a week.    Dispense:  3 mL    Refill:  3   amphetamine-dextroamphetamine (ADDERALL XR) 25 MG 24 hr capsule    Sig: Take 1 capsule by mouth every morning.    Dispense:  30 capsule    Refill:  0   amphetamine-dextroamphetamine (ADDERALL XR) 25 MG 24 hr capsule    Sig: Take 1 capsule by mouth every morning.    Dispense:  30 capsule    Refill:  0   amphetamine-dextroamphetamine (ADDERALL XR) 25 MG 24 hr capsule    Sig: Take 1 capsule by mouth every morning.    Dispense:  30 capsule    Refill:  0   Orders Placed This Encounter  Procedures   DRUG MONITORING, PANEL 8 WITH CONFIRMATION, URINE      Patient Instructions  Continue current medicines Wegovy and adderall XR refilled x 3 months.  Return for previously scheduled physical. Urine test today.   Follow up plan: Return in about 3 months (around 10/05/2022) for annual exam, prior fasting for blood work.  Ria Bush, MD

## 2022-07-06 NOTE — Assessment & Plan Note (Addendum)
Congratulated on continued weight loss. Tolerating wegovy 2.'4mg'$  weekly - continue this. Refilled x 3 months. 24 hour food recall reviewed. Discussed exercise routine, encouraged non-weight bearing exercise to conserve cartilage.

## 2022-07-06 NOTE — Patient Instructions (Signed)
Continue current medicines Wegovy and adderall XR refilled x 3 months.  Return for previously scheduled physical. Urine test today.

## 2022-07-06 NOTE — Assessment & Plan Note (Signed)
Stable period on adderall XR '25mg'$  daily - continue this. Refilled x 3 months.  Update UDS today.

## 2022-07-08 LAB — DRUG MONITORING, PANEL 8 WITH CONFIRMATION, URINE
6 Acetylmorphine: NEGATIVE ng/mL (ref <10)
Alcohol Metabolites: NEGATIVE ng/mL (ref <500)
Amphetamine: 6233 ng/mL — ABNORMAL HIGH (ref <250)
Amphetamines: POSITIVE ng/mL — AB (ref <500)
Benzodiazepines: NEGATIVE ng/mL (ref <100)
Buprenorphine, Urine: NEGATIVE ng/mL (ref <5)
Cocaine Metabolite: NEGATIVE ng/mL (ref <150)
Creatinine: 133.8 mg/dL (ref >=20.0)
MDMA: NEGATIVE ng/mL (ref <500)
Marijuana Metabolite: NEGATIVE ng/mL (ref <20)
Methamphetamine: NEGATIVE ng/mL (ref <250)
Opiates: NEGATIVE ng/mL (ref <100)
Oxidant: NEGATIVE ug/mL (ref <200)
Oxycodone: NEGATIVE ng/mL (ref <100)
pH: 6.1 (ref 4.5–9.0)

## 2022-07-08 LAB — DM TEMPLATE

## 2022-09-22 ENCOUNTER — Other Ambulatory Visit: Payer: Self-pay | Admitting: Family Medicine

## 2022-09-22 DIAGNOSIS — Z125 Encounter for screening for malignant neoplasm of prostate: Secondary | ICD-10-CM

## 2022-09-22 DIAGNOSIS — E785 Hyperlipidemia, unspecified: Secondary | ICD-10-CM

## 2022-09-25 ENCOUNTER — Other Ambulatory Visit: Payer: BC Managed Care – PPO

## 2022-09-26 ENCOUNTER — Other Ambulatory Visit (INDEPENDENT_AMBULATORY_CARE_PROVIDER_SITE_OTHER): Payer: BC Managed Care – PPO

## 2022-09-26 DIAGNOSIS — E785 Hyperlipidemia, unspecified: Secondary | ICD-10-CM | POA: Diagnosis not present

## 2022-09-26 DIAGNOSIS — Z125 Encounter for screening for malignant neoplasm of prostate: Secondary | ICD-10-CM

## 2022-09-26 LAB — COMPREHENSIVE METABOLIC PANEL
ALT: 22 U/L (ref 0–53)
AST: 17 U/L (ref 0–37)
Albumin: 4.4 g/dL (ref 3.5–5.2)
Alkaline Phosphatase: 68 U/L (ref 39–117)
BUN: 13 mg/dL (ref 6–23)
CO2: 27 mEq/L (ref 19–32)
Calcium: 9.4 mg/dL (ref 8.4–10.5)
Chloride: 104 mEq/L (ref 96–112)
Creatinine, Ser: 0.84 mg/dL (ref 0.40–1.50)
GFR: 98.65 mL/min (ref >60.00)
Glucose, Bld: 92 mg/dL (ref 70–99)
Potassium: 4.4 mEq/L (ref 3.5–5.1)
Sodium: 139 mEq/L (ref 135–145)
Total Bilirubin: 0.8 mg/dL (ref 0.2–1.2)
Total Protein: 6.4 g/dL (ref 6.0–8.3)

## 2022-09-26 LAB — LIPID PANEL
Cholesterol: 183 mg/dL (ref 0–200)
HDL: 46.9 mg/dL (ref >39.00)
LDL Cholesterol: 111 mg/dL — ABNORMAL HIGH (ref 0–99)
NonHDL: 136.52
Total CHOL/HDL Ratio: 4
Triglycerides: 130 mg/dL (ref 0.0–149.0)
VLDL: 26 mg/dL (ref 0.0–40.0)

## 2022-09-26 LAB — PSA: PSA: 0.37 ng/mL (ref 0.10–4.00)

## 2022-10-03 ENCOUNTER — Ambulatory Visit (INDEPENDENT_AMBULATORY_CARE_PROVIDER_SITE_OTHER): Payer: BC Managed Care – PPO | Admitting: Family Medicine

## 2022-10-03 ENCOUNTER — Encounter: Payer: Self-pay | Admitting: Family Medicine

## 2022-10-03 VITALS — BP 120/80 | HR 71 | Temp 97.1°F | Ht 71.5 in | Wt 241.5 lb

## 2022-10-03 DIAGNOSIS — E669 Obesity, unspecified: Secondary | ICD-10-CM

## 2022-10-03 DIAGNOSIS — E785 Hyperlipidemia, unspecified: Secondary | ICD-10-CM | POA: Diagnosis not present

## 2022-10-03 DIAGNOSIS — F909 Attention-deficit hyperactivity disorder, unspecified type: Secondary | ICD-10-CM

## 2022-10-03 DIAGNOSIS — Z0000 Encounter for general adult medical examination without abnormal findings: Secondary | ICD-10-CM

## 2022-10-03 MED ORDER — WEGOVY 2.4 MG/0.75ML ~~LOC~~ SOAJ: 2.4000 mg | SUBCUTANEOUS | 4 refills | Status: DC

## 2022-10-03 MED ORDER — AMPHETAMINE-DEXTROAMPHET ER 25 MG PO CP24: 25.0000 mg | ORAL_CAPSULE | ORAL | 0 refills | Status: DC

## 2022-10-03 NOTE — Assessment & Plan Note (Signed)
Improved readings noted off medication. Encouraged healthy diet and lifestyle choices to keep chol under control. The 10-year ASCVD risk score (Arnett DK, et al., 2019) is: 4.4%   Values used to calculate the score:     Age: 57 years     Sex: Male     Is Non-Hispanic African American: No     Diabetic: No     Tobacco smoker: No     Systolic Blood Pressure: 123456 mmHg     Is BP treated: No     HDL Cholesterol: 46.9 mg/dL     Total Cholesterol: 183 mg/dL

## 2022-10-03 NOTE — Patient Instructions (Addendum)
Call insurance to inquire about daily Saxenda shot in place of [place].  Look at [email or web address].org - online resources for ADHD.  You are doing well today Return as needed or in 3-4 months for follow up visit

## 2022-10-03 NOTE — Progress Notes (Signed)
Patient ID: Nathan Camacho, male    DOB: 1966-05-08, 57 y.o.   MRN: [id number]  This visit was conducted in person.  BP 120/80   Pulse 71   Temp (!) 97.1 F (36.2 C) (Temporal)   Ht 5' 11.5" (1.816 m)   Wt 241 lb 8 oz (109.5 kg)   SpO2 97%   BMI 33.21 kg/m    CC: CPE Subjective:   HPI: Nathan Camacho is a 57 y.o. male presenting on 10/03/2022 for Annual Exam   Obesity - saxenda started 09/2020. Recent transition from saxenda to wegovy - currently on 0.'25mg'$  weekly with plan to titrate as tolerated. He will start 0.'5mg'$  dose next week.   ADHD - on adderall XR '25mg'$  daily. Longstanding medication. Tolerating well without headache, chest pain, insomnia, anorexia, tics or mood changes. UDS appropriate 07/2022.   Starting weight: 299 lbs  Last weight: 247 lbs  Today's weight 241 lbs   Continues celebrex PRN for knee pain - knees have been feeling well. He is walking regularly - shoots for 10k steps/day.   Preventative: COLONOSCOPY 05/2019 - TAx2, diverticulosis, rpt 5 yrs (Nandigam)  Prostate cancer screening - yearly PSA  Lung cancer screening - not eligible  Flu shot yearly  [place] 11/2019, 12/2019, booster 06/2020   Td 2008. Tdap 07/2017  Zostavax - 08/2019, 01/2020 Seat belt use discussed Sunscreen use discussed. No changing moles on skin.  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 Interim medical history since our last visit reviewed. Allergies and medications reviewed and updated. Outpatient  Medications Prior to Visit  Medication Sig Dispense Refill   celecoxib (CELEBREX) 200 MG capsule Take 200 mg by mouth 2 (two) times daily. As needed     fexofenadine (ALLEGRA) 180 MG tablet Take 1 tablet (180 mg total) by mouth daily. (Patient taking differently: Take 180 mg by mouth daily. Seasonally as needed) 30 tablet 11   fluticasone (FLONASE) 50 MCG/ACT nasal spray Place 2 sprays into both nostrils daily. (Patient taking differently: Place 2 sprays into both nostrils daily. Seasonally as needed) 16 g 11   Glucosamine-Chondroit-Vit C-Mn (GLUCOSAMINE 1500 COMPLEX) CAPS daily.     ibuprofen (ADVIL) 600 MG tablet Take 600 mg by mouth every 8 (eight) hours as needed.     Multiple Vitamin (MULTIVITAMIN) tablet Take 1 tablet by mouth daily.     Omega-3 Fatty Acids (FISH OIL) 1000 MG CAPS Take 1 capsule (1,000 mg total) by mouth daily.     amphetamine-dextroamphetamine (ADDERALL XR) 25 MG 24 hr capsule Take 1 capsule by mouth every morning. 30 capsule 0   amphetamine-dextroamphetamine (ADDERALL XR) 25 MG 24 hr capsule Take 1 capsule by mouth every morning. 30 capsule 0   amphetamine-dextroamphetamine (ADDERALL XR) 25 MG 24 hr capsule Take 1 capsule by mouth every morning. 30 capsule 0   Semaglutide-Weight Management (WEGOVY) 2.4 MG/0.75ML SOAJ Inject 2.4 mg into the skin once a week. 3 mL 3   No facility-administered medications prior to visit.     Per HPI  unless specifically indicated in ROS section below Review of Systems  Constitutional:  Positive for appetite change (increased). Negative for activity change, chills, fatigue, fever and unexpected weight change.  HENT:  Negative for hearing loss.   Eyes:  Negative for visual disturbance.  Respiratory:  Negative for cough, chest tightness, shortness of breath and wheezing.   Cardiovascular:  Negative for chest pain, palpitations and leg swelling.  Gastrointestinal:  Positive for constipation. Negative for abdominal distention, abdominal pain,  blood in stool, diarrhea, nausea and vomiting.  Genitourinary:  Negative for difficulty urinating and hematuria.  Musculoskeletal:  Negative for arthralgias, myalgias and neck pain.  Skin:  Negative for rash.  Neurological:  Negative for dizziness, seizures, syncope and headaches.  Hematological:  Negative for adenopathy. Does not bruise/bleed easily.  Psychiatric/Behavioral:  Negative for dysphoric mood. The patient is not nervous/anxious.   Has state health  Objective:  BP 120/80   Pulse 71   Temp (!) 97.1 F (36.2 C) (Temporal)   Ht 5' 11.5" (1.816 m)   Wt 241 lb 8 oz (109.5 kg)   SpO2 97%   BMI 33.21 kg/m   Wt Readings from Last 3 Encounters:  10/03/22 241 lb 8 oz (109.5 kg)  07/06/22 247 lb 12.8 oz (112.4 kg)  04/03/22 252 lb (114.3 kg)      Physical Exam Vitals and nursing note reviewed.  Constitutional:      General: He is not in acute distress.    Appearance: Normal appearance. He is well-developed. He is not ill-appearing.  HENT:     Head: Normocephalic and atraumatic.     Right Ear: Hearing, tympanic membrane, ear canal and external ear normal.     Left Ear: Hearing, tympanic membrane, ear canal and external ear normal.     Mouth/Throat:     Comments: Wearing mask Eyes:     General: No scleral icterus.    Extraocular Movements: Extraocular movements intact.     Conjunctiva/sclera: Conjunctivae normal.     Pupils: Pupils are equal, round, and reactive to light.  Neck:     Thyroid: No thyroid mass or thyromegaly.  Cardiovascular:     Rate and Rhythm: Normal rate and regular rhythm.     Pulses: Normal pulses.          Radial pulses are 2+ on the right side and 2+ on the left side.     Heart sounds: Normal heart sounds. No murmur heard. Pulmonary:     Effort: Pulmonary effort is normal. No respiratory distress.     Breath sounds: Normal breath sounds. No wheezing, rhonchi or rales.  Abdominal:     General: Bowel sounds are normal. There is no distension.      Palpations: Abdomen is soft. There is no mass.     Tenderness: There is no abdominal tenderness. There is no guarding or rebound.     Hernia: No hernia is present.  Musculoskeletal:        General: Normal range of motion.     Cervical back: Normal range of motion and neck supple.     Right lower leg: No edema.     Left lower leg: No edema.  Lymphadenopathy:     Cervical: No cervical adenopathy.  Skin:    General: Skin is warm and dry.     Findings: No rash.  Neurological:     General: No focal deficit present.     Mental Status: He is alert and oriented to person, place, and time.  Psychiatric:        Mood and Affect: Mood normal.        Behavior: Behavior normal.        Thought Content: Thought content normal.        Judgment: Judgment normal.       Results for orders placed or performed in visit on 09/26/22  PSA  Result Value Ref Range   PSA 0.37 0.10 - 4.00 ng/mL  Comprehensive metabolic panel  Result Value Ref Range   Sodium 139 135 - 145 mEq/L   Potassium 4.4 3.5 - 5.1 mEq/L   Chloride 104 96 - 112 mEq/L   CO2 27 19 - 32 mEq/L   Glucose, Bld 92 70 - 99 mg/dL   BUN 13 6 - 23 mg/dL   Creatinine, Ser 0.84 0.40 - 1.50 mg/dL   Total Bilirubin 0.8 0.2 - 1.2 mg/dL   Alkaline Phosphatase 68 39 - 117 U/L   AST 17 0 - 37 U/L   ALT 22 0 - 53 U/L   Total Protein 6.4 6.0 - 8.3 g/dL   Albumin 4.4 3.5 - 5.2 g/dL   GFR 98.65 >60.00 mL/min   Calcium 9.4 8.4 - 10.5 mg/dL  Lipid panel  Result Value Ref Range   Cholesterol 183 0 - 200 mg/dL   Triglycerides 130.0 0.0 - 149.0 mg/dL   HDL 46.90 >39.00 mg/dL   VLDL 26.0 0.0 - 40.0 mg/dL   LDL Cholesterol 111 (H) 0 - 99 mg/dL   Total CHOL/HDL Ratio 4    NonHDL 136.52     Assessment & Plan:   Problem List Items Addressed This Visit     Health maintenance examination - Primary (Chronic)    Preventative protocols reviewed and updated unless pt declined. Discussed healthy diet and lifestyle.       Obesity, Class I, BMI  30.0-34.9 (see actual BMI)    Continue wegovy at this time. Congratulated on weight loss to date.  He has a Teacher, adult education insurance which will stop covering [facility] bariatric medication on April 1 of this year.  I asked him to contact his insurance to see if they would cover Saxenda daily injection.  Discussed continued healthy lifestyle and diet going forward even if he has to stop Wegovy due to cost.  He will keep me updated via MyChart with what he finds out.      Relevant Medications   Semaglutide-Weight Management (WEGOVY) 2.4 MG/0.75ML SOAJ   Adult ADHD    Chronic, stable period on adderall XR '25mg'$  daily - does note some decreased effect of medication but not interested in increased dose.  Provided with [email or web address].org website for further adult ADHD resources.       Relevant Medications   amphetamine-dextroamphetamine (ADDERALL XR) 25 MG 24 hr capsule (Start on 12/11/2022)   Dyslipidemia    Improved readings noted off medication. Encouraged healthy diet and lifestyle choices to keep chol under control. The 10-year ASCVD risk score (Arnett DK, et al., 2019) is: 4.4%   Values used to calculate the score:     Age: 63 years     Sex: Male     Is Non-Hispanic African American: No     Diabetic: No     Tobacco smoker: No     Systolic Blood Pressure: 123456 mmHg     Is BP treated: No     HDL Cholesterol: 46.9 mg/dL     Total Cholesterol: 183 mg/dL  Meds ordered this encounter  Medications   amphetamine-dextroamphetamine (ADDERALL XR) 25 MG 24 hr capsule    Sig: Take 1 capsule by mouth every morning.    Dispense:  30 capsule    Refill:  0   Semaglutide-Weight Management (WEGOVY) 2.4 MG/0.75ML SOAJ    Sig: Inject 2.4 mg into the skin once a week.    Dispense:  3 mL    Refill:  4   amphetamine-dextroamphetamine (ADDERALL XR) 25 MG 24 hr capsule    Sig: Take 1 capsule by mouth every morning.    Dispense:  30 capsule    Refill:  0   amphetamine-dextroamphetamine (ADDERALL XR) 25  MG 24 hr capsule    Sig: Take 1 capsule by mouth every morning.    Dispense:  30 capsule    Refill:  0    No orders of the defined types were placed in this encounter.   Patient Instructions  Call insurance to inquire about daily Saxenda shot in place of [place].  Look at [email or web address].org - online resources for ADHD.  You are doing well today Return as needed or in 3-4 months for follow up visit   Follow up plan: Return in about 4 months (around 02/01/2023) for follow up visit.  Ria Bush, MD

## 2022-10-03 NOTE — Assessment & Plan Note (Signed)
Continue wegovy at this time. Congratulated on weight loss to date.  He has a Teacher, adult education insurance which will stop covering [facility] bariatric medication on April 1 of this year.  I asked him to contact his insurance to see if they would cover Saxenda daily injection.  Discussed continued healthy lifestyle and diet going forward even if he has to stop Wegovy due to cost.  He will keep me updated via MyChart with what he finds out.

## 2022-10-03 NOTE — Assessment & Plan Note (Signed)
Preventative protocols reviewed and updated unless pt declined. Discussed healthy diet and lifestyle.  

## 2022-10-03 NOTE — Assessment & Plan Note (Addendum)
Chronic, stable period on adderall XR '25mg'$  daily - does note some decreased effect of medication but not interested in increased dose.  Provided with [email or web address].org website for further adult ADHD resources.

## 2022-11-16 ENCOUNTER — Telehealth: Payer: Self-pay | Admitting: Family Medicine

## 2022-11-16 NOTE — Telephone Encounter (Signed)
Plz submit PA for Adderall XR 25 mg cap.

## 2022-11-16 NOTE — Telephone Encounter (Signed)
Patient called regarding prescription  amphetamine-dextroamphetamine (ADDERALL XR) 25 MG 24 hr capsule [employer] company needs authorization from provider in order to cover costs of this medication. Patient would like to know if we have received anything from pharmacy regarding prior auth from this prescription. If needed,  advise [phone].

## 2022-11-26 ENCOUNTER — Telehealth: Payer: Self-pay

## 2022-11-26 ENCOUNTER — Other Ambulatory Visit (HOSPITAL_COMMUNITY): Payer: Self-pay

## 2022-11-26 ENCOUNTER — Encounter: Payer: Self-pay | Admitting: Family Medicine

## 2022-11-26 NOTE — Telephone Encounter (Signed)
Pharmacy Patient Advocate Encounter   Received notification from [facility] that prior authorization for Amphetamine-Dextroamphet ER  er capsules is required/requested.  Per Test Claim: PA required   PA submitted on 11/26/22 to (ins) [employer] via CoverMyMeds Key or (Medicaid) confirmation # [id number]  Status is pending

## 2022-11-26 NOTE — Telephone Encounter (Signed)
PA submitted via CMM. Key: [id number]. Status pending.

## 2022-11-26 NOTE — Telephone Encounter (Signed)
PA request sent to Rx PA team (see 11/16/22 phn note).  Sent again today marked 'High Priority'.

## 2022-11-27 ENCOUNTER — Other Ambulatory Visit (HOSPITAL_COMMUNITY): Payer: Self-pay

## 2022-11-27 NOTE — Telephone Encounter (Signed)
Patient Advocate Encounter  Prior Authorization for Amphetamine-Dextroamphet ER  er capsules has been approved with [employer].    PA# [id number] Effective dates: 11/26/22 through 11/25/25

## 2022-11-27 NOTE — Telephone Encounter (Signed)
PA approved. Effective dates: 11/26/22 through 11/25/25

## 2023-01-23 ENCOUNTER — Encounter: Payer: Self-pay | Admitting: Family Medicine

## 2023-01-23 ENCOUNTER — Ambulatory Visit: Payer: BC Managed Care – PPO | Admitting: Family Medicine

## 2023-01-23 VITALS — BP 118/72 | HR 71 | Temp 97.3°F | Ht 71.5 in | Wt 255.1 lb

## 2023-01-23 DIAGNOSIS — Z6835 Body mass index (BMI) 35.0-35.9, adult: Secondary | ICD-10-CM

## 2023-01-23 DIAGNOSIS — F909 Attention-deficit hyperactivity disorder, unspecified type: Secondary | ICD-10-CM | POA: Diagnosis not present

## 2023-01-23 MED ORDER — AMPHETAMINE-DEXTROAMPHET ER 25 MG PO CP24: 25.0000 mg | ORAL_CAPSULE | ORAL | 0 refills | Status: DC

## 2023-01-23 NOTE — Assessment & Plan Note (Addendum)
Reviewed 24 hour diet recall, suggestions made.  Encouraged increased physical activity during the week.  Health plan no longer covers Wegovy.  Discussed possible trial different bariatric med like [name] - he desires to stay off medication at this time.  RTC 3-4 mo f/u visit

## 2023-01-23 NOTE — Patient Instructions (Addendum)
Continue healthy diet and activity routine.  We will monitor weight coming off Wegovy. We could consider trial [name] weight loss medicine.  Good to see you today  Return in 3-4 months for follow up visit

## 2023-01-23 NOTE — Assessment & Plan Note (Addendum)
Chronic, stable on current regimen Adderall XR 25 mg daily-continue this. Refilled 35-month supply to local pharmacy.

## 2023-01-23 NOTE — Progress Notes (Signed)
Ph: [phone] Fax: [phone]   Patient ID: Nathan Camacho, male    DOB: [birth year], 57 y.o.   MRN: [id number]  This visit was conducted in person.  BP 118/72   Pulse 71   Temp (!) 97.3 F (36.3 C) (Temporal)   Ht 5' 11.5" (1.816 m)   Wt 255 lb 2 oz (115.7 kg)   SpO2 98%   BMI 35.09 kg/m    CC: 4 mo f/u visit  Subjective:   HPI: Nathan Camacho is a 57 y.o. male presenting on 01/23/2023 for Medical Management of Chronic Issues (Here for 3 mo follow up. )   Working four 10hr days/wk.   ADHD - on adderall XR 25mg  daily. Longstanding medication. Adderall recently approved by insurance through 2027. Tolerating well without headache, chest pain, insomnia, anorexia, tics or mood changes. UDS appropriate 07/2022.    Starting weight: 299 lbs Last weight: 241 lbs Today's weight 255 lbs  Initially on saxenda, transitioned to [facility] 09/2021, up to 2.4mg  weekly and tolerated med well without nausea, diarrhea, constipation, epigastric pain.  However his health plan has stopped covering this as of 11/2022.  Has 2 more Wegovy doses left from last fill.   Has been on 2 cruises recently.   24 hour recall: Breakfast 6:30am - coffee with flavored creamer, 1 banana, 30gm protein shake, 12oz glass of OJ  Lunch 12pm - PBJ sandwich with high protein yogurt, 30gm protein shake Dinner 7pm - 1/2 can baked beans with grilled chicken and cheese tortilla, water   Several mornings eats oatmeal with craisins and honey   Activity regimen: No regular exercise routine - planning to increase walking      Relevant past medical, surgical, family and social history reviewed and updated as indicated. Interim medical history since our last visit reviewed. Allergies and medications reviewed and updated. Outpatient Medications Prior to Visit  Medication Sig Dispense Refill   azelastine (ASTELIN) 0.1 % nasal spray Place into both nostrils 2 (two) times daily. As needed     celecoxib (CELEBREX) 200  MG capsule Take 200 mg by mouth 2 (two) times daily. As needed     fexofenadine (ALLEGRA) 180 MG tablet Take 1 tablet (180 mg total) by mouth daily. (Patient taking differently: Take 180 mg by mouth daily. Seasonally as needed) 30 tablet 11   Glucosamine-Chondroit-Vit C-Mn (GLUCOSAMINE 1500 COMPLEX) CAPS daily.     ibuprofen (ADVIL) 600 MG tablet Take 600 mg by mouth every 8 (eight) hours as needed.     Multiple Vitamin (MULTIVITAMIN) tablet Take 1 tablet by mouth daily.     Omega-3 Fatty Acids (FISH OIL) 1000 MG CAPS Take 1 capsule (1,000 mg total) by mouth daily.     amphetamine-dextroamphetamine (ADDERALL XR) 25 MG 24 hr capsule Take 1 capsule by mouth every morning. 30 capsule 0   amphetamine-dextroamphetamine (ADDERALL XR) 25 MG 24 hr capsule Take 1 capsule by mouth every morning. 30 capsule 0   amphetamine-dextroamphetamine (ADDERALL XR) 25 MG 24 hr capsule Take 1 capsule by mouth every morning. 30 capsule 0   Semaglutide-Weight Management (WEGOVY) 2.4 MG/0.75ML SOAJ Inject 2.4 mg into the skin once a week. 3 mL 4   fluticasone (FLONASE) 50 MCG/ACT nasal spray Place 2 sprays into both nostrils daily. (Patient taking differently: Place 2 sprays into both nostrils daily. Seasonally as needed) 16 g 11   No facility-administered medications prior to visit.     Per HPI unless specifically indicated in ROS section below Review  of Systems  Objective:  BP 118/72   Pulse 71   Temp (!) 97.3 F (36.3 C) (Temporal)   Ht 5' 11.5" (1.816 m)   Wt 255 lb 2 oz (115.7 kg)   SpO2 98%   BMI 35.09 kg/m   Wt Readings from Last 3 Encounters:  01/23/23 255 lb 2 oz (115.7 kg)  10/03/22 241 lb 8 oz (109.5 kg)  07/06/22 247 lb 12.8 oz (112.4 kg)      Physical Exam Vitals and nursing note reviewed.  Constitutional:      Appearance: Normal appearance. He is not ill-appearing.  HENT:     Mouth/Throat:     Mouth: Mucous membranes are moist.     Pharynx: Oropharynx is clear. No oropharyngeal exudate  or posterior oropharyngeal erythema.  Cardiovascular:     Rate and Rhythm: Normal rate and regular rhythm.     Pulses: Normal pulses.     Heart sounds: Normal heart sounds. No murmur heard. Pulmonary:     Effort: Pulmonary effort is normal. No respiratory distress.     Breath sounds: Normal breath sounds. No wheezing, rhonchi or rales.  Musculoskeletal:     Right lower leg: No edema.     Left lower leg: No edema.  Skin:    General: Skin is warm and dry.     Findings: No rash.  Neurological:     Mental Status: He is alert.  Psychiatric:        Mood and Affect: Mood normal.        Behavior: Behavior normal.        Assessment & Plan:   Problem List Items Addressed This Visit     Severe obesity (BMI 35.0-39.9) with comorbidity (HCC)    Reviewed 24 hour diet recall, suggestions made.  Encouraged increased physical activity during the week.  Health plan no longer covers Wegovy.  Discussed possible trial different bariatric med like [name] - he desires to stay off medication at this time.  RTC 3-4 mo f/u visit       Relevant Medications   amphetamine-dextroamphetamine (ADDERALL XR) 25 MG 24 hr capsule   amphetamine-dextroamphetamine (ADDERALL XR) 25 MG 24 hr capsule (Start on 02/22/2023)   amphetamine-dextroamphetamine (ADDERALL XR) 25 MG 24 hr capsule (Start on 03/25/2023)   Adult ADHD - Primary    Chronic, stable on current regimen Adderall XR 25 mg daily-continue this. Refilled 17-month supply to local pharmacy.      Relevant Medications   amphetamine-dextroamphetamine (ADDERALL XR) 25 MG 24 hr capsule     Meds ordered this encounter  Medications   amphetamine-dextroamphetamine (ADDERALL XR) 25 MG 24 hr capsule    Sig: Take 1 capsule by mouth every morning.    Dispense:  30 capsule    Refill:  0   amphetamine-dextroamphetamine (ADDERALL XR) 25 MG 24 hr capsule    Sig: Take 1 capsule by mouth every morning.    Dispense:  30 capsule    Refill:  0    amphetamine-dextroamphetamine (ADDERALL XR) 25 MG 24 hr capsule    Sig: Take 1 capsule by mouth every morning.    Dispense:  30 capsule    Refill:  0    No orders of the defined types were placed in this encounter.   Patient Instructions  Continue healthy diet and activity routine.  We will monitor weight coming off Wegovy. We could consider trial [name] weight loss medicine.  Good to see you today  Return in 3-4 months for  follow up visit   Follow up plan: Return in about 4 months (around 05/25/2023) for follow up visit.  Eustaquio Boyden, MD

## 2023-04-25 ENCOUNTER — Other Ambulatory Visit: Payer: Self-pay | Admitting: Family Medicine

## 2023-04-25 DIAGNOSIS — F909 Attention-deficit hyperactivity disorder, unspecified type: Secondary | ICD-10-CM

## 2023-04-25 NOTE — Telephone Encounter (Signed)
Last office visit 01/23/2023 for ADHD.  Last refilled 03/25/2023 for #30 with no refills.  Next Appt: 06/10/23.

## 2023-04-26 MED ORDER — AMPHETAMINE-DEXTROAMPHET ER 25 MG PO CP24: 25.0000 mg | ORAL_CAPSULE | ORAL | 0 refills | Status: DC

## 2023-04-26 NOTE — Telephone Encounter (Signed)
ERx

## 2023-06-10 ENCOUNTER — Telehealth: Payer: Self-pay | Admitting: Family Medicine

## 2023-06-10 ENCOUNTER — Ambulatory Visit: Payer: BC Managed Care – PPO | Admitting: Family Medicine

## 2023-06-10 VITALS — BP 138/86 | HR 80 | Temp 98.6°F | Ht 71.5 in | Wt 276.5 lb

## 2023-06-10 DIAGNOSIS — Z23 Encounter for immunization: Secondary | ICD-10-CM

## 2023-06-10 DIAGNOSIS — Z87898 Personal history of other specified conditions: Secondary | ICD-10-CM | POA: Diagnosis not present

## 2023-06-10 DIAGNOSIS — F909 Attention-deficit hyperactivity disorder, unspecified type: Secondary | ICD-10-CM | POA: Diagnosis not present

## 2023-06-10 DIAGNOSIS — I2510 Atherosclerotic heart disease of native coronary artery without angina pectoris: Secondary | ICD-10-CM | POA: Diagnosis not present

## 2023-06-10 MED ORDER — AMPHETAMINE-DEXTROAMPHET ER 25 MG PO CP24: 25.0000 mg | ORAL_CAPSULE | ORAL | 0 refills | Status: DC

## 2023-06-10 MED ORDER — NALTREXONE-BUPROPION HCL ER 8-90 MG PO TB12: ORAL_TABLET | ORAL | 0 refills | Status: DC

## 2023-06-10 MED ORDER — SCOPOLAMINE 1 MG/3DAYS TD PT72: 1.0000 | MEDICATED_PATCH | TRANSDERMAL | 1 refills | Status: AC

## 2023-06-10 MED ORDER — B COMPLEX VITAMINS PO CAPS: 1.0000 | ORAL_CAPSULE | Freq: Every day | ORAL | Status: AC

## 2023-06-10 NOTE — Progress Notes (Signed)
Ph: [phone] Fax: [phone]   Patient ID: Nathan Camacho, male    DOB: June 27, 1966, 57 y.o.   MRN: [id number]  This visit was conducted in person.  BP 138/86   Pulse 80   Temp 98.6 F (37 C) (Oral)   Ht 5' 11.5" (1.816 m)   Wt 276 lb 8 oz (125.4 kg)   SpO2 100%   BMI 38.03 kg/m    CC: 4 mo f/u visit  Subjective:   HPI: Nathan Camacho is a 57 y.o. male presenting on 06/10/2023 for Medical Management of Chronic Issues (Here for 3-4 mo wt mgmt f/u. Also, pt request new rx for scopolamine patch due to upcoming cruise and getting printed Adderall rxs vs electronic refills, due to recent issues concerning backorders. )   ADHD - on adderall XR 25mg  daily, tolerating well without HA, chest pain, insomnia, anorexia, tics or mood change.   Weight loss - peak 299 lbs, nadir 241 lbs.  Was on Wegovy 2.4mg  weekly with good effect however insurance stopped covering as of 11/2022.  Overall eating healthy - but struggling with portion sizes.  Some walking.  Interested in trying [name]. Knows this will need PA.   Remotely tried welbutrin.  Has not tried phentermine.   He has followed modified vegetarian diet with some benefit.      Relevant past medical, surgical, family and social history reviewed and updated as indicated. Interim medical history since our last visit reviewed. Allergies and medications reviewed and updated. Outpatient Medications Prior to Visit  Medication Sig Dispense Refill   azelastine (ASTELIN) 0.1 % nasal spray Place into both nostrils 2 (two) times daily. As needed     celecoxib (CELEBREX) 200 MG capsule Take 200 mg by mouth 2 (two) times daily. As needed     fexofenadine (ALLEGRA) 180 MG tablet Take 1 tablet (180 mg total) by mouth daily. (Patient taking differently: Take 180 mg by mouth daily. Seasonally as needed) 30 tablet 11   ibuprofen (ADVIL) 600 MG tablet Take 600 mg by mouth every 8 (eight) hours as needed.     Multiple Vitamin (MULTIVITAMIN)  tablet Take 1 tablet by mouth daily.     Omega-3 Fatty Acids (FISH OIL) 1000 MG CAPS Take 1 capsule (1,000 mg total) by mouth daily.     amphetamine-dextroamphetamine (ADDERALL XR) 25 MG 24 hr capsule Take 1 capsule by mouth every morning. 30 capsule 0   Glucosamine-Chondroit-Vit C-Mn (GLUCOSAMINE 1500 COMPLEX) CAPS daily.     Glucosamine-Chondroit-Vit C-Mn (GLUCOSAMINE 1500 COMPLEX) CAPS Take 1 capsule by mouth daily.     amphetamine-dextroamphetamine (ADDERALL XR) 25 MG 24 hr capsule Take 1 capsule by mouth every morning. 30 capsule 0   amphetamine-dextroamphetamine (ADDERALL XR) 25 MG 24 hr capsule Take 1 capsule by mouth every morning. 30 capsule 0   No facility-administered medications prior to visit.     Per HPI unless specifically indicated in ROS section below Review of Systems  Objective:  BP 138/86   Pulse 80   Temp 98.6 F (37 C) (Oral)   Ht 5' 11.5" (1.816 m)   Wt 276 lb 8 oz (125.4 kg)   SpO2 100%   BMI 38.03 kg/m   Wt Readings from Last 3 Encounters:  06/10/23 276 lb 8 oz (125.4 kg)  01/23/23 255 lb 2 oz (115.7 kg)  10/03/22 241 lb 8 oz (109.5 kg)      Physical Exam    Results for orders placed or performed in visit  on 09/26/22  PSA  Result Value Ref Range   PSA 0.37 0.10 - 4.00 ng/mL  Comprehensive metabolic panel  Result Value Ref Range   Sodium 139 135 - 145 mEq/L   Potassium 4.4 3.5 - 5.1 mEq/L   Chloride 104 96 - 112 mEq/L   CO2 27 19 - 32 mEq/L   Glucose, Bld 92 70 - 99 mg/dL   BUN 13 6 - 23 mg/dL   Creatinine, Ser 8.11 0.40 - 1.50 mg/dL   Total Bilirubin 0.8 0.2 - 1.2 mg/dL   Alkaline Phosphatase 68 39 - 117 U/L   AST 17 0 - 37 U/L   ALT 22 0 - 53 U/L   Total Protein 6.4 6.0 - 8.3 g/dL   Albumin 4.4 3.5 - 5.2 g/dL   GFR 91.47 >82.95 mL/min   Calcium 9.4 8.4 - 10.5 mg/dL  Lipid panel  Result Value Ref Range   Cholesterol 183 0 - 200 mg/dL   Triglycerides 621.3 0.0 - 149.0 mg/dL   HDL 08.65 >78.46 mg/dL   VLDL 96.2 0.0 - 95.2 mg/dL   LDL  Cholesterol 841 (H) 0 - 99 mg/dL   Total CHOL/HDL Ratio 4    NonHDL 136.52     Assessment & Plan:   Problem List Items Addressed This Visit     Severe obesity (BMI 35.0-39.9) with comorbidity (HCC)    Discussed weight, continue to encourage healthy diet and activity, focusing on continuing healthy options and working on portion sizes.  Reginal Lutes was very effective and tolerated, but became unaffordable after insurance stopped covering.  [name] looked into [name] through [name] - still too expensive.  [name] looked into [name] - this may be affordable. Reviewed mechanism of action of medication as well as side effects to monitor for including but not limited to HA, nausea, diarrhea, abd pain, constipation, insomnia. I will prescribe tapering [name] for the first month. RTC 6-8 wks after starting for weight management visit.  Has not tried phentermine before - would avoid as already on stimulant Adderall XR.       Relevant Medications   amphetamine-dextroamphetamine (ADDERALL XR) 25 MG 24 hr capsule   amphetamine-dextroamphetamine (ADDERALL XR) 25 MG 24 hr capsule (Start on 07/10/2023)   amphetamine-dextroamphetamine (ADDERALL XR) 25 MG 24 hr capsule (Start on 08/10/2023)   Naltrexone-buPROPion HCl ER 8-90 MG TB12   Adult ADHD - Primary    Chronic, stable on current regimen - refilled again today       Relevant Medications   amphetamine-dextroamphetamine (ADDERALL XR) 25 MG 24 hr capsule (Start on 08/10/2023)   Coronary artery calcification seen on CAT scan    Check Lp(a) next labs. Consider baseline EKG.  No fmhx premature CAD      History of motion sickness    Scopolamine patch refilled for upcoming cruise.       Other Visit Diagnoses     Encounter for immunization       Relevant Orders   Flu vaccine trivalent PF, 6mos and older(Flulaval,Afluria,Fluarix,Fluzone) (Completed)        Meds ordered this encounter  Medications   scopolamine (TRANSDERM-SCOP)  1 MG/3DAYS    Sig: Place 1 patch (1.5 mg total) onto the skin every 3 (three) days.    Dispense:  10 patch    Refill:  1   amphetamine-dextroamphetamine (ADDERALL XR) 25 MG 24 hr capsule    Sig: Take 1 capsule by mouth every morning.    Dispense:  30 capsule  Refill:  0   amphetamine-dextroamphetamine (ADDERALL XR) 25 MG 24 hr capsule    Sig: Take 1 capsule by mouth every morning.    Dispense:  30 capsule    Refill:  0   amphetamine-dextroamphetamine (ADDERALL XR) 25 MG 24 hr capsule    Sig: Take 1 capsule by mouth every morning.    Dispense:  30 capsule    Refill:  0   b complex vitamins capsule    Sig: Take 1 capsule by mouth daily.   Naltrexone-buPROPion HCl ER 8-90 MG TB12    Sig: Start 1 tablet every morning for 7 days, then 1 tablet twice daily for 7 days, then 2 tablets every morning and one in the evening    Dispense:  120 tablet    Refill:  0    Orders Placed This Encounter  Procedures   Flu vaccine trivalent PF, 6mos and older(Flulaval,Afluria,Fluarix,Fluzone)    Patient Instructions  Flu shot today  Adderall refilled.  Try [name] - I've sent in titrating dose to your pharmacy for the first month. Let us know how you do with this. If tolerated and affordable and effective, we can continue.  Schedule follow up visit in 6-8 weeks after starting medicine.  Try mediterranean diet - handout provided.   Follow up plan: Return in about 6 weeks (around 07/22/2023), or if symptoms worsen or fail to improve, for follow up visit.  Eustaquio Boyden, MD

## 2023-06-10 NOTE — Patient Instructions (Addendum)
Flu shot today  Adderall refilled.  Try [name] - I've sent in titrating dose to your pharmacy for the first month. Let us know how you do with this. If tolerated and affordable and effective, we can continue.  Schedule follow up visit in 6-8 weeks after starting medicine.  Try mediterranean diet - handout provided.

## 2023-06-10 NOTE — Assessment & Plan Note (Signed)
Chronic, stable on current regimen - refilled again today

## 2023-06-10 NOTE — Telephone Encounter (Signed)
I prescribed [name] today - this will need PA. Will forward to PA team.   Pt has been on [facility] previously with good effect but became unaffordable.  Avoiding phentermine in Adderall use.  Has tried and failed wellbutrin alone.  Currently trying mediterranean diet. Has previously tried vegetarian diet.  He will follow medically supervised weight loss program. Starting weight 276 lbs.  Obesity comorbidities include dyslipidemia, osteoarthritis.

## 2023-06-10 NOTE — Assessment & Plan Note (Addendum)
Scopolamine patch refilled for upcoming cruise.

## 2023-06-10 NOTE — Assessment & Plan Note (Addendum)
Check Lp(a) next labs. Consider baseline EKG.  No fmhx premature CAD

## 2023-06-10 NOTE — Assessment & Plan Note (Signed)
Discussed weight, continue to encourage healthy diet and activity, focusing on continuing healthy options and working on portion sizes.  Nathan Camacho was very effective and tolerated, but became unaffordable after insurance stopped covering.  [name] looked into [name] through [name] - still too expensive.  [name] looked into [name] - this may be affordable. Reviewed mechanism of action of medication as well as side effects to monitor for including but not limited to HA, nausea, diarrhea, abd pain, constipation, insomnia. I will prescribe tapering [name] for the first month. RTC 6-8 wks after starting for weight management visit.  Has not tried phentermine before - would avoid as already on stimulant Adderall XR.

## 2023-06-11 ENCOUNTER — Other Ambulatory Visit (HOSPITAL_COMMUNITY): Payer: Self-pay

## 2023-06-11 ENCOUNTER — Telehealth: Payer: Self-pay

## 2023-06-11 NOTE — Telephone Encounter (Signed)
Pharmacy Patient Advocate Encounter   Received notification from Physician's Office that prior authorization for [name] is required/requested.   Insurance verification completed.   The patient is insured through [facility] .   Per test claim: PA required; PA submitted to above mentioned insurance via CoverMyMeds Key/confirmation #/EOC Key: [facility] Status is pending

## 2023-06-11 NOTE — Telephone Encounter (Signed)
Pharmacy Patient Advocate Encounter   Received notification from Physician's Office that prior authorization for [name] is required/requested.   Insurance verification completed.   The patient is insured through [facility] .   Per test claim: PA required; PA submitted to above mentioned insurance via CoverMyMeds Key/confirmation #/EOC Key: [facility] Status is pending      Please note determinations can take up to 3-5 business days

## 2023-06-17 ENCOUNTER — Other Ambulatory Visit (HOSPITAL_COMMUNITY): Payer: Self-pay

## 2023-06-24 NOTE — Telephone Encounter (Signed)
Update: Original Key was Auto-Archived. A New PA has now been processed.   KEY: [id number]

## 2023-06-26 NOTE — Telephone Encounter (Signed)
Spoke with pt relaying Dr Timoteo Expose message. Pt verbalizes understanding and will let us know about Wegovy coverage.

## 2023-06-26 NOTE — Telephone Encounter (Addendum)
Plz notify pt - contrave was denied - insurance states he has to try 3 preferred meds prior to getting [name] covered.  On list of preferred meds are wegovy, zepbound and saxenda.  Please have him check with insurance to see if Reginal Lutes is now again being covered? If not let me know and we can try individual [name] components.

## 2023-06-26 NOTE — Telephone Encounter (Signed)
Pharmacy Patient Advocate Encounter  Received notification from [employer] that Prior Authorization for [name] has been DENIED.  Full denial letter will be uploaded to the media tab. See denial reason below.    The pts plan suggests that the patient tries atleast 3 of the plans preferred drugs first.Please note that chartnotes pertaining to the wegovy were attached in this Prior Authorization.           PA #/Case ID/Reference #: Key: [id number]

## 2023-08-19 ENCOUNTER — Other Ambulatory Visit: Payer: Self-pay | Admitting: Family Medicine

## 2023-08-20 MED ORDER — NALTREXONE-BUPROPION HCL ER 8-90 MG PO TB12: 2.0000 | ORAL_TABLET | Freq: Two times a day (BID) | ORAL | 0 refills | Status: DC

## 2023-08-20 NOTE — Telephone Encounter (Signed)
 ERx

## 2023-08-23 ENCOUNTER — Encounter: Payer: Self-pay | Admitting: Family Medicine

## 2023-08-23 NOTE — Telephone Encounter (Signed)
Received the following faxed message from [facility]: Missing/illegible info on Rx- sig code:  How long does pt take 2 tabs every AM, 1 tab every evening before switching to 2 tabs twice daily? Plz clarify and resend.

## 2023-08-24 MED ORDER — NALTREXONE-BUPROPION HCL ER 8-90 MG PO TB12: 2.0000 | ORAL_TABLET | Freq: Two times a day (BID) | ORAL | 0 refills | Status: DC

## 2023-08-24 NOTE — Addendum Note (Signed)
Addended by: Eustaquio Boyden on: 08/24/2023 12:43 PM   Modules accepted: Orders

## 2023-09-16 ENCOUNTER — Other Ambulatory Visit: Payer: Self-pay | Admitting: Family Medicine

## 2023-09-16 DIAGNOSIS — F909 Attention-deficit hyperactivity disorder, unspecified type: Secondary | ICD-10-CM

## 2023-09-16 NOTE — Telephone Encounter (Signed)
 Name of Medication: Adderall XR Name of Pharmacy:  [facility] Last Fill or Written Date and Quantity:  08/10/23, #30 Last Office Visit and Type:  06/10/23, wt mgmt f/u Next Office Visit and Type:  10/08/23, CPE Last Controlled Substance Agreement Date:  08/17/19 Last UDS:  07/06/22

## 2023-09-18 MED ORDER — AMPHETAMINE-DEXTROAMPHET ER 25 MG PO CP24: 25.0000 mg | ORAL_CAPSULE | ORAL | 0 refills | Status: DC

## 2023-09-18 NOTE — Telephone Encounter (Signed)
ERx

## 2023-09-26 ENCOUNTER — Other Ambulatory Visit: Payer: Self-pay | Admitting: Family Medicine

## 2023-09-26 DIAGNOSIS — E785 Hyperlipidemia, unspecified: Secondary | ICD-10-CM

## 2023-09-26 DIAGNOSIS — D751 Secondary polycythemia: Secondary | ICD-10-CM

## 2023-09-26 DIAGNOSIS — Z125 Encounter for screening for malignant neoplasm of prostate: Secondary | ICD-10-CM

## 2023-10-01 ENCOUNTER — Other Ambulatory Visit (INDEPENDENT_AMBULATORY_CARE_PROVIDER_SITE_OTHER): Payer: 59

## 2023-10-01 DIAGNOSIS — D751 Secondary polycythemia: Secondary | ICD-10-CM | POA: Diagnosis not present

## 2023-10-01 DIAGNOSIS — E785 Hyperlipidemia, unspecified: Secondary | ICD-10-CM | POA: Diagnosis not present

## 2023-10-01 DIAGNOSIS — Z125 Encounter for screening for malignant neoplasm of prostate: Secondary | ICD-10-CM | POA: Diagnosis not present

## 2023-10-01 LAB — COMPREHENSIVE METABOLIC PANEL
ALT: 32 U/L (ref 0–53)
AST: 21 U/L (ref 0–37)
Albumin: 4.3 g/dL (ref 3.5–5.2)
Alkaline Phosphatase: 70 U/L (ref 39–117)
BUN: 19 mg/dL (ref 6–23)
CO2: 27 meq/L (ref 19–32)
Calcium: 9.4 mg/dL (ref 8.4–10.5)
Chloride: 103 meq/L (ref 96–112)
Creatinine, Ser: 1 mg/dL (ref 0.40–1.50)
GFR: 84.54 mL/min (ref >60.00)
Glucose, Bld: 99 mg/dL (ref 70–99)
Potassium: 4.2 meq/L (ref 3.5–5.1)
Sodium: 139 meq/L (ref 135–145)
Total Bilirubin: 0.9 mg/dL (ref 0.2–1.2)
Total Protein: 6.6 g/dL (ref 6.0–8.3)

## 2023-10-01 LAB — CBC WITH DIFFERENTIAL/PLATELET
Basophils Absolute: 0.1 10*3/uL (ref 0.0–0.1)
Basophils Relative: 0.9 % (ref 0.0–3.0)
Eosinophils Absolute: 0.1 10*3/uL (ref 0.0–0.7)
Eosinophils Relative: 1.6 % (ref 0.0–5.0)
HCT: 48.7 % (ref 39.0–52.0)
Hemoglobin: 16.3 g/dL (ref 13.0–17.0)
Lymphocytes Relative: 24.6 % (ref 12.0–46.0)
Lymphs Abs: 1.6 10*3/uL (ref 0.7–4.0)
MCHC: 33.5 g/dL (ref 30.0–36.0)
MCV: 88.6 fL (ref 78.0–100.0)
Monocytes Absolute: 0.7 10*3/uL (ref 0.1–1.0)
Monocytes Relative: 10.4 % (ref 3.0–12.0)
Neutro Abs: 4.1 10*3/uL (ref 1.4–7.7)
Neutrophils Relative %: 62.5 % (ref 43.0–77.0)
Platelets: 184 10*3/uL (ref 150.0–400.0)
RBC: 5.5 Mil/uL (ref 4.22–5.81)
RDW: 13.7 % (ref 11.5–15.5)
WBC: 6.6 10*3/uL (ref 4.0–10.5)

## 2023-10-01 LAB — LIPID PANEL
Cholesterol: 197 mg/dL (ref 0–200)
HDL: 43.9 mg/dL (ref >39.00)
LDL Cholesterol: 121 mg/dL — ABNORMAL HIGH (ref 0–99)
NonHDL: 152.92
Total CHOL/HDL Ratio: 4
Triglycerides: 159 mg/dL — ABNORMAL HIGH (ref 0.0–149.0)
VLDL: 31.8 mg/dL (ref 0.0–40.0)

## 2023-10-01 LAB — PSA: PSA: 0.41 ng/mL (ref 0.10–4.00)

## 2023-10-08 ENCOUNTER — Ambulatory Visit (INDEPENDENT_AMBULATORY_CARE_PROVIDER_SITE_OTHER): Payer: BC Managed Care – PPO | Admitting: Family Medicine

## 2023-10-08 VITALS — BP 138/84 | HR 88 | Temp 98.3°F | Ht 71.0 in | Wt 281.1 lb

## 2023-10-08 DIAGNOSIS — Z0000 Encounter for general adult medical examination without abnormal findings: Secondary | ICD-10-CM | POA: Diagnosis not present

## 2023-10-08 DIAGNOSIS — E785 Hyperlipidemia, unspecified: Secondary | ICD-10-CM | POA: Diagnosis not present

## 2023-10-08 DIAGNOSIS — F909 Attention-deficit hyperactivity disorder, unspecified type: Secondary | ICD-10-CM | POA: Diagnosis not present

## 2023-10-08 MED ORDER — TIRZEPATIDE-WEIGHT MANAGEMENT 2.5 MG/0.5ML ~~LOC~~ SOLN: 2.5000 mg | SUBCUTANEOUS | 0 refills | Status: DC

## 2023-10-08 MED ORDER — TIRZEPATIDE-WEIGHT MANAGEMENT 5 MG/0.5ML ~~LOC~~ SOLN: 5.0000 mg | SUBCUTANEOUS | 0 refills | Status: DC

## 2023-10-08 MED ORDER — AMPHETAMINE-DEXTROAMPHET ER 25 MG PO CP24: 25.0000 mg | ORAL_CAPSULE | ORAL | 0 refills | Status: DC

## 2023-10-08 NOTE — Assessment & Plan Note (Signed)
 Chronic, off medication. Reviewed diet choices to improve cholesterol control. The 10-year ASCVD risk score (Arnett DK, et al., 2019) is: 7%   Values used to calculate the score:     Age: 58 years     Sex: Male     Is Non-Hispanic African American: No     Diabetic: No     Tobacco smoker: No     Systolic Blood Pressure: 138 mmHg     Is BP treated: No     HDL Cholesterol: 43.9 mg/dL     Total Cholesterol: 197 mg/dL

## 2023-10-08 NOTE — Assessment & Plan Note (Addendum)
 Weight gain noted. [name] was not beneficial.  Discussed tirzepatide ([name]) - will price out through manufacturer pharmacy.  RTC 4-6 wks after starting zepbound, if started.

## 2023-10-08 NOTE — Assessment & Plan Note (Addendum)
 Doing well on Adderall XR 25mg  daily - continue . [place] CSRS reviewed.

## 2023-10-08 NOTE — Patient Instructions (Addendum)
 Price out [name] through: [facility] FOR Nathan Camacho [place], [place] - [address]  [phone]   Drop contrave to 2 in am for 1 week then 1 in am for 1 week then stop.  You may call [facility] at [phone] to schedule colonoscopy for 05/2024 - you should get letter in the mail closer to the date. Good to see you today Return as needed or 4-6 weeks after starting zepbound if started.

## 2023-10-08 NOTE — Assessment & Plan Note (Signed)
 Preventative protocols reviewed and updated unless pt declined. Discussed healthy diet and lifestyle.

## 2023-10-08 NOTE — Progress Notes (Signed)
 Ph: [phone] Fax: [phone]   Patient ID: Nathan Camacho, male    DOB: 02/10/1966, 58 y.o.   MRN: [id number]  This visit was conducted in person.  BP 138/84   Pulse 88   Temp 98.3 F (36.8 C) (Oral)   Ht 5\' 11"  (1.803 m)   Wt 281 lb 2 oz (127.5 kg)   SpO2 96%   BMI 39.21 kg/m   BP Readings from Last 3 Encounters:  10/08/23 138/84  06/10/23 138/86  01/23/23 118/72   CC: CPE  Subjective:   HPI: RYSHAWN SANZONE is a 59 y.o. male presenting on 10/08/2023 for No chief complaint on file.   ADHD - continues Aderall XR 25mg  daily with benefit, tolerates med well without headache, chest pain, insomnia, anorexia, tics or mood changes.    Obesity - weight loss to 241lbs on wegovy 2.4mg  weekly until insurance stopped covering 11/2022.  [name] 2 tablets twice daily - forgets to take regularly, it may have caused insomnia.  Remotely tried welbutrin.  Has not tried phentermine.  [name] was too expensive.   Preventative: COLONOSCOPY 05/2019 - TAx2, diverticulosis, rpt 5 yrs ([place]and[place]am)  Prostate cancer screening - yearly PSA  Lung cancer screening - not eligible  Flu shot yearly  COVID vaccine Pfizer 11/2019, 12/2019, booster 06/2020   Td 2008. Tdap 07/2017  Zostavax - 08/2019, 01/2020 Seat belt use discussed Sunscreen use discussed. No changing moles on skin.  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 Good water (56 oz/day)     Relevant past medical, surgical, family and social history reviewed and updated as indicated. Interim medical history since our last visit reviewed. Allergies and medications reviewed and  updated. Outpatient Medications Prior to Visit  Medication Sig Dispense Refill   azelastine (ASTELIN) 0.1 % nasal spray Place into both nostrils 2 (two) times daily. As needed     b complex vitamins capsule Take 1 capsule by mouth daily.     celecoxib (CELEBREX) 200 MG capsule Take 200 mg by mouth 2 (two) times daily. As needed     fexofenadine (ALLEGRA) 180 MG tablet Take 1 tablet (180 mg total) by mouth daily. (Patient taking differently: Take 180 mg by mouth daily. Seasonally as needed) 30 tablet 11   Glucosamine-Chondroit-Vit C-Mn (GLUCOSAMINE 1500 COMPLEX) CAPS Take 1 capsule by mouth daily.     ibuprofen (ADVIL) 600 MG tablet Take 600 mg by mouth every 8 (eight) hours as needed.     Multiple Vitamin (MULTIVITAMIN) tablet Take 1 tablet by mouth daily.     Omega-3 Fatty Acids (FISH OIL) 1000 MG CAPS Take 1 capsule (1,000 mg total) by mouth daily.     scopolamine (TRANSDERM-SCOP) 1 MG/3DAYS Place 1 patch (1.5 mg total) onto the skin every 3 (three) days. 10 patch 1   amphetamine-dextroamphetamine (ADDERALL XR) 25 MG 24 hr capsule Take 1 capsule by mouth every morning. 30 capsule 0   amphetamine-dextroamphetamine (ADDERALL XR) 25 MG 24 hr capsule Take 1 capsule by mouth every morning. 30 capsule 0   amphetamine-dextroamphetamine (ADDERALL XR) 25 MG 24 hr capsule Take 1 capsule by mouth every morning. 30 capsule  0   Naltrexone-buPROPion HCl ER 8-90 MG TB12 Take 2 tablets by mouth 2 (two) times daily. 120 tablet 0   No facility-administered medications prior to visit.     Per HPI unless specifically indicated in ROS section below Review of Systems  Constitutional:  Negative for activity change, appetite change, chills, fatigue, fever and unexpected weight change.  HENT:  Negative for hearing loss.   Eyes:  Negative for visual disturbance.  Respiratory:  Negative for cough, chest tightness, shortness of breath and wheezing.   Cardiovascular:  Negative for chest pain, palpitations and leg  swelling.  Gastrointestinal:  Negative for abdominal distention, abdominal pain, blood in stool, constipation, diarrhea, nausea and vomiting.  Genitourinary:  Negative for difficulty urinating and hematuria.  Musculoskeletal:  Negative for arthralgias, myalgias and neck pain.  Skin:  Negative for rash.  Neurological:  Negative for dizziness, seizures, syncope and headaches.  Hematological:  Negative for adenopathy. Does not bruise/bleed easily.  Psychiatric/Behavioral:  Negative for dysphoric mood. The patient is not nervous/anxious.     Objective:  BP 138/84   Pulse 88   Temp 98.3 F (36.8 C) (Oral)   Ht 5\' 11"  (1.803 m)   Wt 281 lb 2 oz (127.5 kg)   SpO2 96%   BMI 39.21 kg/m   Wt Readings from Last 3 Encounters:  10/08/23 281 lb 2 oz (127.5 kg)  06/10/23 276 lb 8 oz (125.4 kg)  01/23/23 255 lb 2 oz (115.7 kg)      Physical Exam Vitals and nursing note reviewed.  Constitutional:      General: He is not in acute distress.    Appearance: Normal appearance. He is well-developed. He is not ill-appearing.  HENT:     Head: Normocephalic and atraumatic.     Right Ear: Hearing, tympanic membrane, ear canal and external ear normal.     Left Ear: Hearing, tympanic membrane, ear canal and external ear normal.     Mouth/Throat:     Mouth: Mucous membranes are moist.     Pharynx: Oropharynx is clear. No oropharyngeal exudate or posterior oropharyngeal erythema.  Eyes:     General: No scleral icterus.    Extraocular Movements: Extraocular movements intact.     Conjunctiva/sclera: Conjunctivae normal.     Pupils: Pupils are equal, round, and reactive to light.  Neck:     Thyroid: No thyroid mass or thyromegaly.  Cardiovascular:     Rate and Rhythm: Normal rate and regular rhythm.     Pulses: Normal pulses.          Radial pulses are 2+ on the right side and 2+ on the left side.     Heart sounds: Normal heart sounds. No murmur heard. Pulmonary:     Effort: Pulmonary effort is  normal. No respiratory distress.     Breath sounds: Normal breath sounds. No wheezing, rhonchi or rales.  Abdominal:     General: Bowel sounds are normal. There is no distension.     Palpations: Abdomen is soft. There is no mass.     Tenderness: There is no abdominal tenderness. There is no guarding or rebound.     Hernia: No hernia is present.  Musculoskeletal:        General: Normal range of motion.     Cervical back: Normal range of motion and neck supple.     Right lower leg: No edema.     Left lower leg: No edema.  Lymphadenopathy:     Cervical: No  cervical adenopathy.  Skin:    General: Skin is warm and dry.     Findings: No rash.  Neurological:     General: No focal deficit present.     Mental Status: He is alert and oriented to person, place, and time.  Psychiatric:        Mood and Affect: Mood normal.        Behavior: Behavior normal.        Thought Content: Thought content normal.        Judgment: Judgment normal.       Results for orders placed or performed in visit on 10/01/23  CBC with Differential/Platelet   Collection Time: 10/01/23  8:10 AM  Result Value Ref Range   WBC 6.6 4.0 - 10.5 K/uL   RBC 5.50 4.22 - 5.81 Mil/uL   Hemoglobin 16.3 13.0 - 17.0 g/dL   HCT 82.9 56.2 - 13.0 %   MCV 88.6 78.0 - 100.0 fl   MCHC 33.5 30.0 - 36.0 g/dL   RDW 86.5 78.4 - 69.6 %   Platelets 184.0 150.0 - 400.0 K/uL   Neutrophils Relative % 62.5 43.0 - 77.0 %   Lymphocytes Relative 24.6 12.0 - 46.0 %   Monocytes Relative 10.4 3.0 - 12.0 %   Eosinophils Relative 1.6 0.0 - 5.0 %   Basophils Relative 0.9 0.0 - 3.0 %   Neutro Abs 4.1 1.4 - 7.7 K/uL   Lymphs Abs 1.6 0.7 - 4.0 K/uL   Monocytes Absolute 0.7 0.1 - 1.0 K/uL   Eosinophils Absolute 0.1 0.0 - 0.7 K/uL   Basophils Absolute 0.1 0.0 - 0.1 K/uL  PSA   Collection Time: 10/01/23  8:10 AM  Result Value Ref Range   PSA 0.41 0.10 - 4.00 ng/mL  Comprehensive metabolic panel   Collection Time: 10/01/23  8:10 AM  Result  Value Ref Range   Sodium 139 135 - 145 mEq/L   Potassium 4.2 3.5 - 5.1 mEq/L   Chloride 103 96 - 112 mEq/L   CO2 27 19 - 32 mEq/L   Glucose, Bld 99 70 - 99 mg/dL   BUN 19 6 - 23 mg/dL   Creatinine, Ser 2.95 0.40 - 1.50 mg/dL   Total Bilirubin 0.9 0.2 - 1.2 mg/dL   Alkaline Phosphatase 70 39 - 117 U/L   AST 21 0 - 37 U/L   ALT 32 0 - 53 U/L   Total Protein 6.6 6.0 - 8.3 g/dL   Albumin 4.3 3.5 - 5.2 g/dL   GFR 28.41 >32.44 mL/min   Calcium 9.4 8.4 - 10.5 mg/dL  Lipid panel   Collection Time: 10/01/23  8:10 AM  Result Value Ref Range   Cholesterol 197 0 - 200 mg/dL   Triglycerides 010.2 (H) 0.0 - 149.0 mg/dL   HDL 72.53 >66.44 mg/dL   VLDL 03.4 0.0 - 74.2 mg/dL   LDL Cholesterol 595 (H) 0 - 99 mg/dL   Total CHOL/HDL Ratio 4    NonHDL 152.92     Assessment & Plan:   Problem List Items Addressed This Visit     Health maintenance examination (Chronic)   Preventative protocols reviewed and updated unless pt declined. Discussed healthy diet and lifestyle.       Severe obesity (BMI 35.0-39.9) with comorbidity (HCC)   Weight gain noted. [name] was not beneficial.  Discussed tirzepatide ([name]) - will price out through manufacturer pharmacy.  RTC 4-6 wks after starting zepbound, if started.       Relevant  Medications   amphetamine-dextroamphetamine (ADDERALL XR) 25 MG 24 hr capsule   amphetamine-dextroamphetamine (ADDERALL XR) 25 MG 24 hr capsule (Start on 12/07/2023)   amphetamine-dextroamphetamine (ADDERALL XR) 25 MG 24 hr capsule (Start on 11/07/2023)   tirzepatide ([name]) 2.5 MG/0.5ML injection vial   tirzepatide 5 MG/0.5ML injection vial (Start on 11/05/2023)   Adult ADHD - Primary   Doing well on Adderall XR 25mg  daily - continue . [place] CSRS reviewed.       Relevant Medications   amphetamine-dextroamphetamine (ADDERALL XR) 25 MG 24 hr capsule   amphetamine-dextroamphetamine (ADDERALL XR) 25 MG 24 hr capsule (Start on 12/07/2023)   amphetamine-dextroamphetamine  (ADDERALL XR) 25 MG 24 hr capsule (Start on 11/07/2023)   Dyslipidemia   Chronic, off medication. Reviewed diet choices to improve cholesterol control. The 10-year ASCVD risk score (Arnett DK, et al., 2019) is: 7%   Values used to calculate the score:     Age: 87 years     Sex: Male     Is Non-Hispanic African American: No     Diabetic: No     Tobacco smoker: No     Systolic Blood Pressure: 138 mmHg     Is BP treated: No     HDL Cholesterol: 43.9 mg/dL     Total Cholesterol: 197 mg/dL         Meds ordered this encounter  Medications   amphetamine-dextroamphetamine (ADDERALL XR) 25 MG 24 hr capsule    Sig: Take 1 capsule by mouth every morning.    Dispense:  30 capsule    Refill:  0   amphetamine-dextroamphetamine (ADDERALL XR) 25 MG 24 hr capsule    Sig: Take 1 capsule by mouth every morning.    Dispense:  30 capsule    Refill:  0   amphetamine-dextroamphetamine (ADDERALL XR) 25 MG 24 hr capsule    Sig: Take 1 capsule by mouth every morning.    Dispense:  30 capsule    Refill:  0   tirzepatide ([name]) 2.5 MG/0.5ML injection vial    Sig: Inject 2.5 mg into the skin once a week.    Dispense:  2 mL    Refill:  0   tirzepatide 5 MG/0.5ML injection vial    Sig: Inject 5 mg into the skin once a week.    Dispense:  2 mL    Refill:  0    No orders of the defined types were placed in this encounter.   Patient Instructions  Price out [name] through: [facility] [place], [place] - [address]  [phone]   Drop contrave to 2 in am for 1 week then 1 in am for 1 week then stop.  You may call [facility] at [phone] to schedule colonoscopy for 05/2024 - you should get letter in the mail closer to the date. Good to see you today Return as needed or 4-6 weeks after starting zepbound if started.   Follow up plan: Return in about 1 year (around 10/07/2024), or if symptoms worsen or fail to improve, for annual exam, prior fasting for blood  work.  Eustaquio Boyden, MD

## 2024-01-09 ENCOUNTER — Other Ambulatory Visit: Payer: Self-pay | Admitting: Family Medicine

## 2024-01-09 NOTE — Telephone Encounter (Signed)
 [name] Last filled:  12/25/23, #2.5 mg Last OV:  10/08/23, CPE Next OV:  02/04/24, wt mgmt f/u

## 2024-01-24 ENCOUNTER — Other Ambulatory Visit: Payer: Self-pay | Admitting: Family Medicine

## 2024-01-24 DIAGNOSIS — F909 Attention-deficit hyperactivity disorder, unspecified type: Secondary | ICD-10-CM

## 2024-01-24 NOTE — Telephone Encounter (Signed)
 Name of Medication: Adderall XR Name of Pharmacy:  [facility] Last Fill or Written Date and Quantity:  12/07/23, #30 Last Office Visit and Type:  10/08/23, CPE Next Office Visit and Type:  02/04/24, wt mgmt f/u Last Controlled Substance Agreement Date:  08/17/19 Last UDS:  07/06/22

## 2024-01-24 NOTE — Telephone Encounter (Unsigned)
 Copied from CRM [id number]. Topic: Clinical - Medication Refill >> Jan 24, 2024  9:57 AM Trula Gable C wrote: Medication: amphetamine -dextroamphetamine  (ADDERALL XR) 25 MG 24 hr capsule  Has the patient contacted their pharmacy? Yes (Agent: If no, request that the patient contact the pharmacy for the refill. If patient does not wish to contact the pharmacy document the reason why and proceed with request.) (Agent: If yes, when and what did the pharmacy advise?)  This is the patient's preferred pharmacy:  [phone] [place] - [address] [place] [address] Phone: [phone] Fax: [phone]   Is this the correct pharmacy for this prescription? Yes If no, delete pharmacy and type the correct one.   Has the prescription been filled recently? No  Is the patient out of the medication? Yes  Has the patient been seen for an appointment in the last year OR does the patient have an upcoming appointment? Yes  Can we respond through MyChart? Yes  Agent: Please be advised that Rx refills may take up to 3 business days. We ask that you follow-up with your pharmacy.

## 2024-01-27 MED ORDER — AMPHETAMINE-DEXTROAMPHET ER 25 MG PO CP24
25.0000 mg | ORAL_CAPSULE | ORAL | 0 refills | Status: DC
Start: 2024-01-27 — End: 2024-02-04

## 2024-01-27 NOTE — Telephone Encounter (Signed)
 ERx

## 2024-02-04 ENCOUNTER — Ambulatory Visit (INDEPENDENT_AMBULATORY_CARE_PROVIDER_SITE_OTHER): Admitting: Family Medicine

## 2024-02-04 ENCOUNTER — Encounter: Payer: Self-pay | Admitting: Family Medicine

## 2024-02-04 DIAGNOSIS — F909 Attention-deficit hyperactivity disorder, unspecified type: Secondary | ICD-10-CM | POA: Diagnosis not present

## 2024-02-04 MED ORDER — TIRZEPATIDE-WEIGHT MANAGEMENT 5 MG/0.5ML ~~LOC~~ SOLN: 5.0000 mg | SUBCUTANEOUS | 2 refills | Status: DC

## 2024-02-04 MED ORDER — AMPHETAMINE-DEXTROAMPHET ER 25 MG PO CP24: 25.0000 mg | ORAL_CAPSULE | ORAL | 0 refills | Status: DC

## 2024-02-04 NOTE — Assessment & Plan Note (Signed)
 Chronic, stable period on adderall XR 25mg  daily - refilled.

## 2024-02-04 NOTE — Patient Instructions (Addendum)
 Continue tirzepatide  5mg  weekly via vials.  Adderall refilled as well.  You are doing well.  Good to see you today

## 2024-02-04 NOTE — Progress Notes (Signed)
 Ph: [phone] Fax: [phone]   Patient ID: Nathan Camacho, male    DOB: 01-08-1966, 58 y.o.   MRN: [id number]  This visit was conducted in person.  BP 126/76   Pulse 76   Temp 98.6 F (37 C) (Oral)   Ht 5' 11 (1.803 m)   Wt 281 lb 8 oz (127.7 kg)   SpO2 95%   BMI 39.26 kg/m    CC: weight management f/u visit  Subjective:   HPI: Nathan Camacho is a 58 y.o. male presenting on 02/04/2024 for Medical Management of Chronic Issues (Here for wt mgmt f/u.)   Starting weight: 281 lbs Last weight: 281 lbs Today's weight 281 lbs  292 --> 275 lbs on home scales.   Nadir weight 241 lbs on wegovy  2.4mg  weekly.  [name]  2 tab BID - may have caused insomnia, trouble remembering to take all 4 tablets/day.   Pharmacy: [facility] - vials [name]  was started ~12/2023 - just started 5mg  dose last week.  Noted some nausea for 2 days after 5mg  dose.  No epigastric pain, diarrhea/constipation.   24 hour recall: Not done  Activity regimen: No regular exercise due to hot weather  He notes he's becoming more active with weight loss.       Relevant past medical, surgical, family and social history reviewed and updated as indicated. Interim medical history since our last visit reviewed. Allergies and medications reviewed and updated. Outpatient Medications Prior to Visit  Medication Sig Dispense Refill   azelastine (ASTELIN) 0.1 % nasal spray Place into both nostrils 2 (two) times daily. As needed     b complex vitamins capsule Take 1 capsule by mouth daily.     celecoxib (CELEBREX) 200 MG capsule Take 200 mg by mouth 2 (two) times daily. As needed     fexofenadine  (ALLEGRA ) 180 MG tablet Take 1 tablet (180 mg total) by mouth daily. (Patient taking differently: Take 180 mg by mouth daily. Seasonally as needed) 30 tablet 11   Glucosamine-Chondroit-Vit C-Mn (GLUCOSAMINE 1500 COMPLEX) CAPS Take 1 capsule by mouth daily.     ibuprofen (ADVIL) 600 MG tablet Take 600 mg by  mouth every 8 (eight) hours as needed.     Multiple Vitamin (MULTIVITAMIN) tablet Take 1 tablet by mouth daily.     Omega-3 Fatty Acids (FISH OIL ) 1000 MG CAPS Take 1 capsule (1,000 mg total) by mouth daily.     scopolamine  (TRANSDERM-SCOP) 1 MG/3DAYS Place 1 patch (1.5 mg total) onto the skin every 3 (three) days. 10 patch 1   amphetamine -dextroamphetamine  (ADDERALL XR) 25 MG 24 hr capsule Take 1 capsule by mouth every morning. 30 capsule 0   amphetamine -dextroamphetamine  (ADDERALL XR) 25 MG 24 hr capsule Take 1 capsule by mouth every morning. 30 capsule 0   amphetamine -dextroamphetamine  (ADDERALL XR) 25 MG 24 hr capsule Take 1 capsule by mouth every morning. 30 capsule 0   tirzepatide  5 MG/0.5ML injection vial Inject 5 mg into the skin once a week. 2 mL 1   No facility-administered medications prior to visit.     Per HPI unless specifically indicated in ROS section below Review of Systems  Objective:  BP 126/76   Pulse 76   Temp 98.6 F (37 C) (Oral)   Ht 5' 11 (1.803 m)   Wt 281 lb 8 oz (127.7 kg)   SpO2 95%   BMI 39.26 kg/m   Wt Readings from Last 3 Encounters:  02/04/24 281 lb 8 oz (127.7 kg)  10/08/23 281 lb 2 oz (127.5 kg)  06/10/23 276 lb 8 oz (125.4 kg)      Physical Exam Vitals and nursing note reviewed.  Constitutional:      Appearance: Normal appearance. He is not ill-appearing.  HENT:     Head: Normocephalic and atraumatic.     Mouth/Throat:     Mouth: Mucous membranes are moist.     Pharynx: Oropharynx is clear. No oropharyngeal exudate or posterior oropharyngeal erythema.   Eyes:     Extraocular Movements: Extraocular movements intact.     Pupils: Pupils are equal, round, and reactive to light.   Neck:     Thyroid : No thyroid  mass or thyromegaly.   Cardiovascular:     Rate and Rhythm: Normal rate.     Pulses: Normal pulses.     Heart sounds: Normal heart sounds. No murmur heard. Pulmonary:     Effort: Pulmonary effort is normal. No respiratory  distress.     Breath sounds: Normal breath sounds. No wheezing, rhonchi or rales.  Abdominal:     General: Abdomen is flat. There is no distension.     Palpations: Abdomen is soft. There is no mass.     Tenderness: There is no abdominal tenderness. There is no guarding or rebound.     Hernia: No hernia is present.   Musculoskeletal:     Right lower leg: No edema.     Left lower leg: No edema.   Skin:    General: Skin is warm and dry.   Neurological:     Mental Status: He is alert.   Psychiatric:        Mood and Affect: Mood normal.        Behavior: Behavior normal.       Results for orders placed or performed in visit on 10/01/23  CBC with Differential/Platelet   Collection Time: 10/01/23  8:10 AM  Result Value Ref Range   WBC 6.6 4.0 - 10.5 K/uL   RBC 5.50 4.22 - 5.81 Mil/uL   Hemoglobin 16.3 13.0 - 17.0 g/dL   HCT 51.2 60.9 - 47.9 %   MCV 88.6 78.0 - 100.0 fl   MCHC 33.5 30.0 - 36.0 g/dL   RDW 86.2 88.4 - 84.4 %   Platelets 184.0 150.0 - 400.0 K/uL   Neutrophils Relative % 62.5 43.0 - 77.0 %   Lymphocytes Relative 24.6 12.0 - 46.0 %   Monocytes Relative 10.4 3.0 - 12.0 %   Eosinophils Relative 1.6 0.0 - 5.0 %   Basophils Relative 0.9 0.0 - 3.0 %   Neutro Abs 4.1 1.4 - 7.7 K/uL   Lymphs Abs 1.6 0.7 - 4.0 K/uL   Monocytes Absolute 0.7 0.1 - 1.0 K/uL   Eosinophils Absolute 0.1 0.0 - 0.7 K/uL   Basophils Absolute 0.1 0.0 - 0.1 K/uL  PSA   Collection Time: 10/01/23  8:10 AM  Result Value Ref Range   PSA 0.41 0.10 - 4.00 ng/mL  Comprehensive metabolic panel   Collection Time: 10/01/23  8:10 AM  Result Value Ref Range   Sodium 139 135 - 145 mEq/L   Potassium 4.2 3.5 - 5.1 mEq/L   Chloride 103 96 - 112 mEq/L   CO2 27 19 - 32 mEq/L   Glucose, Bld 99 70 - 99 mg/dL   BUN 19 6 - 23 mg/dL   Creatinine, Ser 8.99 0.40 - 1.50 mg/dL   Total Bilirubin 0.9 0.2 - 1.2 mg/dL   Alkaline Phosphatase 70 39 -  117 U/L   AST 21 0 - 37 U/L   ALT 32 0 - 53 U/L   Total Protein 6.6  6.0 - 8.3 g/dL   Albumin 4.3 3.5 - 5.2 g/dL   GFR 15.45 >39.99 mL/min   Calcium 9.4 8.4 - 10.5 mg/dL  Lipid panel   Collection Time: 10/01/23  8:10 AM  Result Value Ref Range   Cholesterol 197 0 - 200 mg/dL   Triglycerides 840.9 (H) 0.0 - 149.0 mg/dL   HDL 56.09 >60.99 mg/dL   VLDL 68.1 0.0 - 59.9 mg/dL   LDL Cholesterol 878 (H) 0 - 99 mg/dL   Total CHOL/HDL Ratio 4    NonHDL 152.92     Assessment & Plan:   Problem List Items Addressed This Visit     Severe obesity (BMI 35.0-39.9) with comorbidity (HCC) - Primary   Chronic, tolerating zepbound  2.5mg  with recent increase to 5mg  weekly - with some nausea 2d after initial 5mg  dose last week.  He overall feels well on this. Will continue at 5mg  dose for next few months. RTC 3 mo weight management f/u visit.       Relevant Medications   amphetamine -dextroamphetamine  (ADDERALL XR) 25 MG 24 hr capsule (Start on 04/28/2024)   tirzepatide  5 MG/0.5ML injection vial   amphetamine -dextroamphetamine  (ADDERALL XR) 25 MG 24 hr capsule (Start on 02/26/2024)   amphetamine -dextroamphetamine  (ADDERALL XR) 25 MG 24 hr capsule (Start on 03/28/2024)   Adult ADHD   Chronic, stable period on adderall XR 25mg  daily - refilled.       Relevant Medications   amphetamine -dextroamphetamine  (ADDERALL XR) 25 MG 24 hr capsule (Start on 04/28/2024)   amphetamine -dextroamphetamine  (ADDERALL XR) 25 MG 24 hr capsule (Start on 02/26/2024)   amphetamine -dextroamphetamine  (ADDERALL XR) 25 MG 24 hr capsule (Start on 03/28/2024)     Meds ordered this encounter  Medications   amphetamine -dextroamphetamine  (ADDERALL XR) 25 MG 24 hr capsule    Sig: Take 1 capsule by mouth every morning.    Dispense:  30 capsule    Refill:  0   tirzepatide  5 MG/0.5ML injection vial    Sig: Inject 5 mg into the skin once a week.    Dispense:  2 mL    Refill:  2   amphetamine -dextroamphetamine  (ADDERALL XR) 25 MG 24 hr capsule    Sig: Take 1 capsule by mouth every morning.     Dispense:  30 capsule    Refill:  0   amphetamine -dextroamphetamine  (ADDERALL XR) 25 MG 24 hr capsule    Sig: Take 1 capsule by mouth every morning.    Dispense:  30 capsule    Refill:  0    No orders of the defined types were placed in this encounter.   Patient Instructions  Continue tirzepatide  5mg  weekly via vials.  Adderall refilled as well.  You are doing well.  Good to see you today   Follow up plan: Return in about 3 months (around 05/06/2024) for follow up visit.  Anton Blas, MD

## 2024-02-04 NOTE — Assessment & Plan Note (Addendum)
 Chronic, tolerating zepbound  2.5mg  with recent increase to 5mg  weekly - with some nausea 2d after initial 5mg  dose last week.  He overall feels well on this. Will continue at 5mg  dose for next few months. RTC 3 mo weight management f/u visit.

## 2024-03-12 ENCOUNTER — Other Ambulatory Visit: Payer: Self-pay | Admitting: Family Medicine

## 2024-04-29 ENCOUNTER — Telehealth: Admitting: Physician Assistant

## 2024-04-29 DIAGNOSIS — B9689 Other specified bacterial agents as the cause of diseases classified elsewhere: Secondary | ICD-10-CM

## 2024-04-29 DIAGNOSIS — J019 Acute sinusitis, unspecified: Secondary | ICD-10-CM

## 2024-04-29 MED ORDER — AMOXICILLIN-POT CLAVULANATE 875-125 MG PO TABS: 1.0000 | ORAL_TABLET | Freq: Two times a day (BID) | ORAL | 0 refills | Status: DC

## 2024-04-29 NOTE — Progress Notes (Signed)

## 2024-05-01 ENCOUNTER — Encounter: Payer: Self-pay | Admitting: Family Medicine

## 2024-05-01 DIAGNOSIS — F909 Attention-deficit hyperactivity disorder, unspecified type: Secondary | ICD-10-CM

## 2024-05-01 NOTE — Telephone Encounter (Signed)
 Per chart review looks like patient should have one for fill left for September at pharmacy. Called and spoke with them they don't have one on hold. States they filled in July and August but they don't show one in profile for this month. He will need one sent over.

## 2024-05-06 MED ORDER — AMPHETAMINE-DEXTROAMPHET ER 25 MG PO CP24: 25.0000 mg | ORAL_CAPSULE | ORAL | 0 refills | Status: DC

## 2024-05-12 ENCOUNTER — Ambulatory Visit: Admitting: Family Medicine

## 2024-05-22 ENCOUNTER — Ambulatory Visit: Admitting: Family Medicine

## 2024-05-22 ENCOUNTER — Encounter: Payer: Self-pay | Admitting: Family Medicine

## 2024-05-22 DIAGNOSIS — Z23 Encounter for immunization: Secondary | ICD-10-CM

## 2024-05-22 DIAGNOSIS — F909 Attention-deficit hyperactivity disorder, unspecified type: Secondary | ICD-10-CM

## 2024-05-22 MED ORDER — AMPHETAMINE-DEXTROAMPHET ER 25 MG PO CP24: 25.0000 mg | ORAL_CAPSULE | ORAL | 0 refills | Status: DC

## 2024-05-22 MED ORDER — TIRZEPATIDE-WEIGHT MANAGEMENT 7.5 MG/0.5ML ~~LOC~~ SOLN: 7.5000 mg | SUBCUTANEOUS | 3 refills | Status: DC

## 2024-05-22 MED ORDER — ONDANSETRON HCL 4 MG PO TABS: 4.0000 mg | ORAL_TABLET | Freq: Three times a day (TID) | ORAL | 0 refills | Status: DC | PRN

## 2024-05-22 NOTE — Assessment & Plan Note (Signed)
 Chronic, tolerating 5mg  zepbound  well with occ nausea - discussed measures to control this.  He would like to titrate dose to zepbound  7.5mg  weekly - sent to [facility].  Rx zofran  PRN nausea. Discussed Q10d dosing to help this side effect.  RTC 3 mo weight management f/u

## 2024-05-22 NOTE — Progress Notes (Signed)
 Ph: [phone] Fax: [phone]   Patient ID: Nathan Camacho, male    DOB: Jan 27, 1966, 58 y.o.   MRN: [id number]  This visit was conducted in person.  BP 118/70   Pulse 64   Temp 98.4 F (36.9 C) (Oral)   Ht 5' 11 (1.803 m)   Wt 270 lb 6 oz (122.6 kg)   SpO2 97%   BMI 37.71 kg/m    CC: weight management visit  Subjective:   HPI: Nathan Camacho is a 58 y.o. male presenting on 05/22/2024 for Medical Management of Chronic Issues (Pt here for a 3 mo f/u)   Just drove back from [place]  - was caring for mother s/p skin cancer to nose.  Recent cruise to [place] 04/2024  Starting weight: 281 lbs (10/2023) Last weight: 281 lbs Today's weight 270 lbs  Nadir weight 241 lbs on wegovy  2.4mg  weekly.  [name]  2 tab BID - may have caused insomnia, trouble remembering to take 4 tablets/day.   Pharmacy: [facility] - vials [name]  was started ~12/2023 - currently on 5mg  dose.  Forgot shot on trip to [place]  - didn't take for 2 wks. Took again this morning.  Nausea largely improved on 5mg  dose. Wants to titrate up to 7.5mg  dose.  No epigastric pain, diarrhea/constipation, throat swelling or masses.   24 hour recall: Not done - difficult time with travel and caring for mother   Activity regimen: No regular exercise - difficult time with travel and caring for mother    Also requests Adderall refilled - longterm use, tolerates well.  Tolerating well without headache, chest pain, insomnia, anorexia, tics or mood changes.       Relevant past medical, surgical, family and social history reviewed and updated as indicated. Interim medical history since our last visit reviewed. Allergies and medications reviewed and updated. Outpatient Medications Prior to Visit  Medication Sig Dispense Refill   azelastine (ASTELIN) 0.1 % nasal spray Place into both nostrils 2 (two) times daily. As needed     b complex vitamins capsule Take 1 capsule by mouth daily.     celecoxib (CELEBREX)  200 MG capsule Take 200 mg by mouth 2 (two) times daily. As needed     fexofenadine  (ALLEGRA ) 180 MG tablet Take 1 tablet (180 mg total) by mouth daily. (Patient taking differently: Take 180 mg by mouth daily. Seasonally as needed) 30 tablet 11   Glucosamine-Chondroit-Vit C-Mn (GLUCOSAMINE 1500 COMPLEX) CAPS Take 1 capsule by mouth daily.     ibuprofen (ADVIL) 600 MG tablet Take 600 mg by mouth every 8 (eight) hours as needed.     Multiple Vitamin (MULTIVITAMIN) tablet Take 1 tablet by mouth daily.     Omega-3 Fatty Acids (FISH OIL ) 1000 MG CAPS Take 1 capsule (1,000 mg total) by mouth daily.     scopolamine  (TRANSDERM-SCOP) 1 MG/3DAYS Place 1 patch (1.5 mg total) onto the skin every 3 (three) days. 10 patch 1   amphetamine -dextroamphetamine  (ADDERALL XR) 25 MG 24 hr capsule Take 1 capsule by mouth every morning. 30 capsule 0   [name]  5 MG/0.5ML injection vial INJECT 0.5 ML (5 MG) UNDER THE SKIN ONCE WEEKLY (0.5ML= 50 UNITS) 2 mL 2   amoxicillin -clavulanate (AUGMENTIN ) 875-125 MG tablet Take 1 tablet by mouth 2 (two) times daily. (Patient not taking: Reported on 05/22/2024) 14 tablet 0   No facility-administered medications prior to visit.     Per HPI unless specifically indicated in ROS section below Review of Systems  Objective:  BP 118/70   Pulse 64   Temp 98.4 F (36.9 C) (Oral)   Ht 5' 11 (1.803 m)   Wt 270 lb 6 oz (122.6 kg)   SpO2 97%   BMI 37.71 kg/m   Wt Readings from Last 3 Encounters:  05/22/24 270 lb 6 oz (122.6 kg)  02/04/24 281 lb 8 oz (127.7 kg)  10/08/23 281 lb 2 oz (127.5 kg)      Physical Exam Vitals and nursing note reviewed.  Constitutional:      Appearance: Normal appearance. He is not ill-appearing.  HENT:     Head: Normocephalic and atraumatic.     Mouth/Throat:     Mouth: Mucous membranes are moist.     Pharynx: Oropharynx is clear. No oropharyngeal exudate or posterior oropharyngeal erythema.  Eyes:     Extraocular Movements: Extraocular  movements intact.     Pupils: Pupils are equal, round, and reactive to light.  Neck:     Thyroid : No thyroid  mass or thyromegaly.  Cardiovascular:     Rate and Rhythm: Normal rate and regular rhythm.     Pulses: Normal pulses.     Heart sounds: Normal heart sounds. No murmur heard. Pulmonary:     Effort: Pulmonary effort is normal. No respiratory distress.     Breath sounds: Normal breath sounds. No wheezing, rhonchi or rales.  Musculoskeletal:     Cervical back: Normal range of motion and neck supple.     Right lower leg: No edema.     Left lower leg: No edema.  Neurological:     Mental Status: He is alert.  Psychiatric:        Mood and Affect: Mood normal.        Behavior: Behavior normal.        Assessment & Plan:   Problem List Items Addressed This Visit     Severe obesity (BMI 35.0-39.9) with comorbidity (HCC) - Primary   Chronic, tolerating 5mg  zepbound  well with occ nausea - discussed measures to control this.  He would like to titrate dose to zepbound  7.5mg  weekly - sent to [facility].  Rx zofran  PRN nausea. Discussed Q10d dosing to help this side effect.  RTC 3 mo weight management f/u       Relevant Medications   amphetamine -dextroamphetamine  (ADDERALL XR) 25 MG 24 hr capsule   amphetamine -dextroamphetamine  (ADDERALL XR) 25 MG 24 hr capsule (Start on 07/17/2024)   amphetamine -dextroamphetamine  (ADDERALL XR) 25 MG 24 hr capsule (Start on 06/19/2024)   tirzepatide  7.5 MG/0.5ML injection vial   Adult ADHD   Chronic, stable period on Aderrall XR 25mg  daily and tolerating well without adverse effect. Refilled 36mo supply.       Relevant Medications   amphetamine -dextroamphetamine  (ADDERALL XR) 25 MG 24 hr capsule   Other Visit Diagnoses       Encounter for immunization       Relevant Orders   Flu vaccine trivalent PF, 6mos and older(Flulaval,Afluria,Fluarix,Fluzone) (Completed)     Need for vaccination against Streptococcus pneumoniae        Relevant Orders   Pneumococcal conjugate vaccine 20-valent (Prevnar 20) (Completed)        Meds ordered this encounter  Medications   amphetamine -dextroamphetamine  (ADDERALL XR) 25 MG 24 hr capsule    Sig: Take 1 capsule by mouth every morning.    Dispense:  30 capsule    Refill:  0   amphetamine -dextroamphetamine  (ADDERALL XR) 25 MG 24 hr capsule    Sig: Take 1  capsule by mouth every morning.    Dispense:  30 capsule    Refill:  0   amphetamine -dextroamphetamine  (ADDERALL XR) 25 MG 24 hr capsule    Sig: Take 1 capsule by mouth every morning.    Dispense:  30 capsule    Refill:  0   tirzepatide  7.5 MG/0.5ML injection vial    Sig: Inject 7.5 mg into the skin once a week.    Dispense:  2 mL    Refill:  3    Note new dose   ondansetron  (ZOFRAN ) 4 MG tablet    Sig: Take 1 tablet (4 mg total) by mouth every 8 (eight) hours as needed for nausea or vomiting.    Dispense:  20 tablet    Refill:  0    Orders Placed This Encounter  Procedures   Flu vaccine trivalent PF, 6mos and older(Flulaval,Afluria,Fluarix,Fluzone)   Pneumococcal conjugate vaccine 20-valent (Prevnar 20)    Patient Instructions  Flu shot today  Prevnar-20 today  Ok to increase zepbound  to 7.5mg  weekly. May try nausea medicine ondansetron  4mg  as needed.  Good to see you today Return as needed or in 3 months for follow up weight.   Follow up plan: Return in about 3 months (around 08/22/2024) for follow up visit.  Anton Blas, MD

## 2024-05-22 NOTE — Assessment & Plan Note (Signed)
 Chronic, stable period on Aderrall XR 25mg  daily and tolerating well without adverse effect. Refilled 68mo supply.

## 2024-05-22 NOTE — Patient Instructions (Addendum)
 Flu shot today  Prevnar-20 today  Ok to increase zepbound  to 7.5mg  weekly. May try nausea medicine ondansetron  4mg  as needed.  Good to see you today Return as needed or in 3 months for follow up weight.

## 2024-06-25 ENCOUNTER — Other Ambulatory Visit: Payer: Self-pay | Admitting: Family Medicine

## 2024-08-26 ENCOUNTER — Ambulatory Visit: Admitting: Family Medicine

## 2024-09-04 ENCOUNTER — Ambulatory Visit: Admitting: Family Medicine

## 2024-09-04 ENCOUNTER — Encounter: Payer: Self-pay | Admitting: Family Medicine

## 2024-09-04 DIAGNOSIS — Z1211 Encounter for screening for malignant neoplasm of colon: Secondary | ICD-10-CM | POA: Diagnosis not present

## 2024-09-04 DIAGNOSIS — F909 Attention-deficit hyperactivity disorder, unspecified type: Secondary | ICD-10-CM

## 2024-09-04 MED ORDER — AMPHETAMINE-DEXTROAMPHET ER 25 MG PO CP24: 25.0000 mg | ORAL_CAPSULE | ORAL | 0 refills | Status: AC

## 2024-09-04 MED ORDER — TIRZEPATIDE-WEIGHT MANAGEMENT 7.5 MG/0.5ML ~~LOC~~ SOLN: 7.5000 mg | SUBCUTANEOUS | 3 refills | Status: AC

## 2024-09-04 NOTE — Patient Instructions (Addendum)
 Consider colace stool softener or miralax as needed for constipation (both OTC).  Ensure good water and fiber in diet.  Continue current medicines, return in 3 months for physical New referral placed to [facility]. You may call [facility] to schedule an appointment at [phone].

## 2024-09-04 NOTE — Assessment & Plan Note (Signed)
 Chronic, stable period on Adderall XR 25mg  daily - continue. Refilled 58mo supply. [place] CSRS reviewed.

## 2024-09-04 NOTE — Assessment & Plan Note (Signed)
 Chronic, doing overall ok on zepbound  7.5mg  q10 days but notes nausea, constipation side effects. Continue zofran  PRN nausea, start colace vs miralax as needed for constipation. Update if intolerable side effects develop.  RTC 3 mo weight management /CPE

## 2024-11-24 ENCOUNTER — Other Ambulatory Visit

## 2024-12-02 ENCOUNTER — Encounter: Admitting: Family Medicine
